# Patient Record
Sex: Male | Born: 1985
Health system: Southern US, Community
[De-identification: ages and names within clinical notes are randomized; demographics above are authoritative.]

## PROBLEM LIST (undated history)

## (undated) ENCOUNTER — Ambulatory Visit: Admission: EM

## (undated) DIAGNOSIS — I10 Essential (primary) hypertension: Secondary | ICD-10-CM

## (undated) DIAGNOSIS — F419 Anxiety disorder, unspecified: Secondary | ICD-10-CM

## (undated) DIAGNOSIS — F32A Depression, unspecified: Secondary | ICD-10-CM

## (undated) DIAGNOSIS — R55 Syncope and collapse: Secondary | ICD-10-CM

## (undated) DIAGNOSIS — F329 Major depressive disorder, single episode, unspecified: Secondary | ICD-10-CM

## (undated) DIAGNOSIS — F1911 Other psychoactive substance abuse, in remission: Secondary | ICD-10-CM

## (undated) HISTORY — DX: Major depressive disorder, single episode, unspecified: F32.9

## (undated) HISTORY — DX: Depression, unspecified: F32.A

## (undated) HISTORY — PX: WISDOM TOOTH EXTRACTION: SHX21

## (undated) HISTORY — DX: Essential (primary) hypertension: I10

## (undated) HISTORY — DX: Anxiety disorder, unspecified: F41.9

---

## 2009-02-17 ENCOUNTER — Emergency Department (HOSPITAL_COMMUNITY): Admission: EM | Admit: 2009-02-17 | Discharge: 2009-02-17 | Payer: Self-pay | Admitting: Emergency Medicine

## 2009-02-18 ENCOUNTER — Ambulatory Visit: Payer: Self-pay | Admitting: Family Medicine

## 2009-09-29 ENCOUNTER — Ambulatory Visit: Payer: Self-pay | Admitting: Family Medicine

## 2009-11-05 ENCOUNTER — Emergency Department (HOSPITAL_COMMUNITY): Admission: EM | Admit: 2009-11-05 | Discharge: 2009-11-05 | Payer: Self-pay | Admitting: Emergency Medicine

## 2009-11-18 ENCOUNTER — Emergency Department (HOSPITAL_COMMUNITY): Admission: EM | Admit: 2009-11-18 | Discharge: 2009-11-18 | Payer: Self-pay | Admitting: Family Medicine

## 2010-01-26 ENCOUNTER — Emergency Department (HOSPITAL_COMMUNITY): Admission: EM | Admit: 2010-01-26 | Discharge: 2010-01-26 | Payer: Self-pay | Admitting: Emergency Medicine

## 2010-05-01 ENCOUNTER — Emergency Department (HOSPITAL_COMMUNITY): Admission: EM | Admit: 2010-05-01 | Discharge: 2010-05-01 | Payer: Self-pay | Admitting: Emergency Medicine

## 2010-05-04 ENCOUNTER — Emergency Department (HOSPITAL_COMMUNITY): Admission: EM | Admit: 2010-05-04 | Discharge: 2010-05-04 | Payer: Self-pay | Admitting: Emergency Medicine

## 2010-05-05 ENCOUNTER — Emergency Department: Payer: Self-pay | Admitting: Emergency Medicine

## 2010-05-07 ENCOUNTER — Emergency Department: Payer: Self-pay | Admitting: Emergency Medicine

## 2010-07-12 ENCOUNTER — Encounter: Payer: Self-pay | Admitting: Emergency Medicine

## 2010-09-01 LAB — POCT I-STAT, CHEM 8
Chloride: 105 mEq/L (ref 96–112)
Creatinine, Ser: 1.1 mg/dL (ref 0.4–1.5)
Glucose, Bld: 97 mg/dL (ref 70–99)
HCT: 51 % (ref 39.0–52.0)
Hemoglobin: 17.3 g/dL — ABNORMAL HIGH (ref 13.0–17.0)
Potassium: 4.1 mEq/L (ref 3.5–5.1)
Sodium: 139 mEq/L (ref 135–145)

## 2010-09-01 LAB — DIFFERENTIAL
Basophils Absolute: 0 10*3/uL (ref 0.0–0.1)
Basophils Relative: 0 % (ref 0–1)
Lymphocytes Relative: 23 % (ref 12–46)
Monocytes Absolute: 0.6 10*3/uL (ref 0.1–1.0)
Monocytes Relative: 6 % (ref 3–12)
Neutro Abs: 6.8 10*3/uL (ref 1.7–7.7)
Neutrophils Relative %: 67 % (ref 43–77)

## 2010-09-01 LAB — CBC
HCT: 46.6 % (ref 39.0–52.0)
Hemoglobin: 16 g/dL (ref 13.0–17.0)
WBC: 10.1 10*3/uL (ref 4.0–10.5)

## 2010-11-10 ENCOUNTER — Inpatient Hospital Stay (INDEPENDENT_AMBULATORY_CARE_PROVIDER_SITE_OTHER)
Admission: RE | Admit: 2010-11-10 | Discharge: 2010-11-10 | Disposition: A | Discharge status: Home / Self Care | Payer: Self-pay | Source: Ambulatory Visit | Attending: Family Medicine | Admitting: Family Medicine

## 2010-11-10 DIAGNOSIS — F410 Panic disorder [episodic paroxysmal anxiety] without agoraphobia: Secondary | ICD-10-CM

## 2011-04-07 ENCOUNTER — Inpatient Hospital Stay (INDEPENDENT_AMBULATORY_CARE_PROVIDER_SITE_OTHER)
Admission: RE | Admit: 2011-04-07 | Discharge: 2011-04-07 | Disposition: A | Discharge status: Home / Self Care | Payer: Self-pay | Source: Ambulatory Visit | Attending: Emergency Medicine | Admitting: Emergency Medicine

## 2011-04-07 DIAGNOSIS — F411 Generalized anxiety disorder: Secondary | ICD-10-CM

## 2011-04-14 ENCOUNTER — Encounter: Payer: Self-pay | Admitting: Medical

## 2011-04-14 ENCOUNTER — Ambulatory Visit (INDEPENDENT_AMBULATORY_CARE_PROVIDER_SITE_OTHER): Payer: Self-pay | Admitting: Medical

## 2011-04-14 VITALS — BP 160/90 | HR 116 | Temp 98.1°F | Resp 20 | Wt 209.0 lb

## 2011-04-14 DIAGNOSIS — F419 Anxiety disorder, unspecified: Secondary | ICD-10-CM | POA: Insufficient documentation

## 2011-04-14 DIAGNOSIS — F411 Generalized anxiety disorder: Secondary | ICD-10-CM

## 2011-04-14 DIAGNOSIS — F32A Depression, unspecified: Secondary | ICD-10-CM | POA: Insufficient documentation

## 2011-04-14 DIAGNOSIS — F329 Major depressive disorder, single episode, unspecified: Secondary | ICD-10-CM

## 2011-04-14 DIAGNOSIS — I10 Essential (primary) hypertension: Secondary | ICD-10-CM | POA: Insufficient documentation

## 2011-04-14 MED ORDER — LISINOPRIL-HYDROCHLOROTHIAZIDE 10-12.5 MG PO TABS: 1.0000 | ORAL_TABLET | Freq: Every day | ORAL | Status: DC

## 2011-04-14 MED ORDER — SERTRALINE HCL 50 MG PO TABS: 50.0000 mg | ORAL_TABLET | Freq: Every day | ORAL | Status: DC

## 2011-04-14 MED ORDER — ALPRAZOLAM 1 MG PO TABS: 1.0000 mg | ORAL_TABLET | Freq: Every evening | ORAL | Status: DC | PRN
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
## 2011-04-14 NOTE — Patient Instructions (Signed)
Anxiety and Panic Attacks Your caregiver has informed you that you are having an anxiety or panic attack. There may be many forms of this. Most of the time these attacks come suddenly and without warning. They come at any time of day, including periods of sleep, and at any time of life. They may be strong and unexplained. Although panic attacks are very scary, they are physically harmless. Sometimes the cause of your anxiety is not known. Anxiety is a protective mechanism of the body in its fight or flight mechanism. Most of these perceived danger situations are actually nonphysical situations (such as anxiety over losing a job). CAUSES  The causes of an anxiety or panic attack are many. Panic attacks may occur in otherwise healthy people given a certain set of circumstances. There may be a genetic cause for panic attacks. Some medications may also have anxiety as a side effect. SYMPTOMS  Some of the most common feelings are:  Intense terror.   Dizziness, feeling faint.   Hot and cold flashes.   Fear of going crazy.   Feelings that nothing is real.   Sweating.   Shaking.   Chest pain or a fast heartbeat (palpitations).   Smothering, choking sensations.   Feelings of impending doom and that death is near.   Tingling of extremities, this may be from over-breathing.   Altered reality (derealization).   Being detached from yourself (depersonalization).  Several symptoms can be present to make up anxiety or panic attacks. DIAGNOSIS  The evaluation by your caregiver will depend on the type of symptoms you are experiencing. The diagnosis of anxiety or panic attack is made when no physical illness can be determined to be a cause of the symptoms. TREATMENT  Treatment to prevent anxiety and panic attacks may include:  Avoidance of circumstances that cause anxiety.   Reassurance and relaxation.   Regular exercise.   Relaxation therapies, such as yoga.   Psychotherapy with a  psychiatrist or therapist.   Avoidance of caffeine, alcohol and illegal drugs.   Prescribed medication.  SEEK IMMEDIATE MEDICAL CARE IF:   You experience panic attack symptoms that are different than your usual symptoms.   You have any worsening or concerning symptoms.  Document Released: 06/07/2005 Document Revised: 02/17/2011 Document Reviewed: 10/09/2009 ExitCare Patient Information 2012 [employer].Depression, Adolescent and Adult Depression is a true and treatable medical condition. In general there are two kinds of depression:  Depression we all experience in some form. For example depression from the death of a loved one, financial distress or natural disasters will trigger or increase depression.   Clinical depression, on the other hand, appears without an apparent cause or reason. This depression is a disease. Depression may be caused by chemical imbalance in the body and brain or may come as a response to a physical illness. Alcohol and other drugs can cause depression.  DIAGNOSIS  The diagnosis of depression is usually based upon symptoms and medical history. TREATMENT  Treatments for depression fall into three categories. These are:  Drug therapy. There are many medicines that treat depression. Responses may vary and sometimes trial and error is necessary to determine the best medicines and dosage for a particular patient.   Psychotherapy, also called talking treatments, helps people resolve their problems by looking at them from a different point of view and by giving people insight into their own personal makeup. Traditional psychotherapy looks at a childhood source of a problem. Other psychotherapy will look at current conflicts and   move toward solving those. If the cause of depression is drug use, counseling is available to help abstain. In time the depression will usually improve. If there were underlying causes for the chemical use, they can be addressed.   ECT  (electroconvulsive therapy) or shock treatment is not as commonly used today. It is a very effective treatment for severe suicidal depression. During ECT electrical impulses are applied to the head. These impulses cause a generalized seizure. It can be effective but causes a loss of memory for recent events. Sometimes this loss of memory may include the last several months.  Treat all depression or suicide threats as serious. Obtain professional help. Do not wait to see if serious depression will get better over time without help. Seek help for yourself or those around you. In the [place] the number to the National Suicide Help Lines With 24 Hour Help Are: 1-800-SUICIDE 1-800-784-2433 Document Released: 06/04/2000 Document Revised: 02/17/2011 Document Reviewed: 01/24/2008 ExitCare Patient Information 2012 [employer]. 

## 2011-05-27 ENCOUNTER — Encounter (HOSPITAL_COMMUNITY): Payer: Self-pay | Admitting: *Deleted

## 2011-05-27 ENCOUNTER — Emergency Department (HOSPITAL_COMMUNITY)
Admission: EM | Admit: 2011-05-27 | Discharge: 2011-05-28 | Disposition: A | Discharge status: Rehabilitation Facility | Payer: Self-pay | Attending: Emergency Medicine | Admitting: Emergency Medicine

## 2011-05-27 DIAGNOSIS — F411 Generalized anxiety disorder: Secondary | ICD-10-CM | POA: Insufficient documentation

## 2011-05-27 DIAGNOSIS — F1920 Other psychoactive substance dependence, uncomplicated: Secondary | ICD-10-CM

## 2011-05-27 DIAGNOSIS — F3289 Other specified depressive episodes: Secondary | ICD-10-CM | POA: Insufficient documentation

## 2011-05-27 DIAGNOSIS — I10 Essential (primary) hypertension: Secondary | ICD-10-CM | POA: Insufficient documentation

## 2011-05-27 DIAGNOSIS — F329 Major depressive disorder, single episode, unspecified: Secondary | ICD-10-CM | POA: Insufficient documentation

## 2011-05-27 LAB — COMPREHENSIVE METABOLIC PANEL
ALT: 32 U/L (ref 0–53)
Alkaline Phosphatase: 62 U/L (ref 39–117)
BUN: 10 mg/dL (ref 6–23)
CO2: 33 mEq/L — ABNORMAL HIGH (ref 19–32)
Chloride: 99 mEq/L (ref 96–112)
GFR calc Af Amer: >90 mL/min (ref >90)
GFR calc non Af Amer: >90 mL/min (ref >90)
Glucose, Bld: 100 mg/dL — ABNORMAL HIGH (ref 70–99)
Potassium: 4.3 mEq/L (ref 3.5–5.1)
Sodium: 138 mEq/L (ref 135–145)
Total Bilirubin: 0.3 mg/dL (ref 0.3–1.2)

## 2011-05-27 LAB — ACETAMINOPHEN LEVEL: Acetaminophen (Tylenol), Serum: <15 ug/mL (ref 10–30)

## 2011-05-27 LAB — ETHANOL: Alcohol, Ethyl (B): <11 mg/dL (ref 0–11)

## 2011-05-27 LAB — COMPREHENSIVE METABOLIC PANEL WITH GFR
AST: 21 U/L (ref 0–37)
Albumin: 4.2 g/dL (ref 3.5–5.2)
Calcium: 9.8 mg/dL (ref 8.4–10.5)
Creatinine, Ser: 0.93 mg/dL (ref 0.50–1.35)
Total Protein: 8 g/dL (ref 6.0–8.3)

## 2011-05-27 LAB — CBC
HCT: 46.1 % (ref 39.0–52.0)
Hemoglobin: 15.8 g/dL (ref 13.0–17.0)
MCH: 30.8 pg (ref 26.0–34.0)
MCHC: 34.3 g/dL (ref 30.0–36.0)
MCV: 89.9 fL (ref 78.0–100.0)
Platelets: 237 K/uL (ref 150–400)
RBC: 5.13 MIL/uL (ref 4.22–5.81)
RDW: 12.8 % (ref 11.5–15.5)
WBC: 10.1 K/uL (ref 4.0–10.5)

## 2011-05-27 MED ORDER — CLONIDINE HCL 0.1 MG PO TABS
0.1000 mg | ORAL_TABLET | Freq: Two times a day (BID) | ORAL | Status: DC
  Administered 2011-05-27 – 2011-05-28 (×2): 0.1 mg via ORAL
  Filled 2011-05-27 (×2): qty 1

## 2011-05-27 MED ORDER — IBUPROFEN 200 MG PO TABS: 600.0000 mg | ORAL_TABLET | Freq: Three times a day (TID) | ORAL | Status: DC | PRN

## 2011-05-27 MED ORDER — ACETAMINOPHEN 325 MG PO TABS: 650.0000 mg | ORAL_TABLET | ORAL | Status: DC | PRN

## 2011-05-27 MED ORDER — ALUM & MAG HYDROXIDE-SIMETH 200-200-20 MG/5ML PO SUSP: 30.0000 mL | ORAL | Status: DC | PRN

## 2011-05-27 MED ORDER — LORAZEPAM 1 MG PO TABS
ORAL_TABLET | ORAL | Status: AC
  Filled 2011-05-27: qty 1

## 2011-05-27 MED ORDER — ZOLPIDEM TARTRATE 5 MG PO TABS
10.0000 mg | ORAL_TABLET | Freq: Once | ORAL | Status: AC
  Administered 2011-05-27: 10 mg via ORAL
  Filled 2011-05-27: qty 2

## 2011-05-27 MED ORDER — LORAZEPAM 1 MG PO TABS
1.0000 mg | ORAL_TABLET | Freq: Three times a day (TID) | ORAL | Status: DC | PRN
  Administered 2011-05-28: 1 mg via ORAL
  Filled 2011-05-27: qty 1

## 2011-05-27 MED ORDER — LORAZEPAM 1 MG PO TABS
1.0000 mg | ORAL_TABLET | Freq: Once | ORAL | Status: AC
  Administered 2011-05-27: 1 mg via ORAL

## 2011-05-27 NOTE — ED Provider Notes (Signed)
  Physical Exam  BP 148/100  Pulse 66  Temp(Src) 98.7 F (37.1 C) (Oral)  Resp 16  SpO2 99%  Physical Exam  ED Course  Procedures  MDM Sign out from [name].  Depressed but not suicidal.  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     Glynn Octave, MD 05/27/11 [phone]

## 2011-05-27 NOTE — BH Assessment (Signed)
Assessment Note   Eric Keller is an 25 y.o. male.  Eric Keller was accepted to [facility] by Eric Keller there.  This clinician got authorization from [facility] with [facility].  Authorization is for 3 days, [facility]  #[id number].  Coleman said that his sister would be able to take him.  He will call her and she should be picking him up between 0700-0800.  Nurse will need to call [facility] at [phone] when patient leaves.   Axis I: Substance Abuse Axis II: Deferred Axis III:  Past Medical History  Diagnosis Date  . Anxiety   . Depression   . Hypertension    Axis IV: other psychosocial or environmental problems and problems related to legal system/crime Axis V: 31-40 impairment in reality testing  Past Medical History:  Past Medical History  Diagnosis Date  . Anxiety   . Depression   . Hypertension     History reviewed. No pertinent past surgical history.  Family History: History reviewed. No pertinent family history.  Social History:  reports that he has never smoked. He does not have any smokeless tobacco history on file. He reports that he drinks alcohol. He reports that he uses illicit drugs.  Additional Social History:  Alcohol / Drug Use Pain Medications: Methadone Prescriptions: Xanax Over the Counter: N/A History of alcohol / drug use?: Yes Substance #1 Name of Substance 1: Methadone 1 - Age of First Use: 25 years old 1 - Amount (size/oz): 120 mg per day  1 - Frequency: qam usually at 0600 1 - Duration: That amount since September '12 1 - Last Use / Amount: 12/06 at 0600 Substance #2 Name of Substance 2: xanax 2 - Age of First Use: 25 years old 2 - Amount (size/oz): 8mg  per day  2 - Frequency: Daily use usually twice daily to total 8 mg 2 - Duration: Since May of '12 2 - Last Use / Amount: 12/05 Substance #3 Name of Substance 3: Marijuana 3 - Age of First Use: 25 years old 3 - Amount (size/oz): About a bowl 3 - Frequency: Usually twice weekly 3 - Duration: Years 3 - Last Use / Amount:  12/05 Substance #4 Name of Substance 4: ETOH 4 - Age of First Use: 25 years old 4 - Amount (size/oz): Amount varies, maybe about 3-4 drinks 4 - Frequency: Usually twice weekly 4 - Duration: Years 4 - Last Use / Amount: About a week ago Allergies: No Known Allergies  Home Medications:  Medications Prior to Admission  Medication Dose Route Frequency Provider Last Rate Last Dose  . acetaminophen (TYLENOL) tablet 650 mg  650 mg Oral Q4H PRN [name]. [name], MD      . alum & mag hydroxide-simeth (MAALOX/MYLANTA) 200-200-20 MG/5ML suspension 30 mL  30 mL Oral PRN [name]. [name], MD      . cloNIDine (CATAPRES) tablet 0.1 mg  0.1 mg Oral BID [name]. [name], MD      . ibuprofen (ADVIL,MOTRIN) tablet 600 mg  600 mg Oral Q8H PRN [name]. [name], MD      . LORazepam (ATIVAN) tablet 1 mg  1 mg Oral Once [name]. [name], MD   1 mg at 05/27/11 1958  . LORazepam (ATIVAN) tablet 1 mg  1 mg Oral Q8H PRN [name]. [name], MD       Medications Prior to Admission  Medication Sig Dispense Refill  . ALPRAZolam (XANAX) 1 MG tablet Take 1 mg by mouth every 3 (three) hours as needed. For anxiety       .  lisinopril-hydrochlorothiazide (PRINZIDE,ZESTORETIC) 10-12.5 MG per tablet Take 1 tablet by mouth daily.  30 tablet  3    OB/GYN Status:  No LMP for male patient.  General Assessment Data Assessment Number: 1  Living Arrangements: Parent Can pt return to current living arrangement?: Yes Admission Status: Voluntary Is patient capable of signing voluntary admission?: Yes Transfer from: Acute Hospital Referral Source: Self/Family/Friend     Risk to self Suicidal Ideation: No Suicidal Intent: No Is patient at risk for suicide?: No Suicidal Plan?: No Access to Means: No What has been your use of drugs/alcohol within the last 12 months?:  (Using xanax and methadone) Previous Attempts/Gestures: No How many times?:  (N/A) Other Self Harm Risks: None Triggers for Past Attempts: None known Intentional  Self Injurious Behavior: None Family Suicide History: No Recent stressful life event(s): Legal Issues;Turmoil (Comment) (Family problems & girlfriend problems) Persecutory voices/beliefs?: No Depression: Yes Depression Symptoms: Despondent;Insomnia;Guilt Substance abuse history and/or treatment for substance abuse?: Yes Suicide prevention information given to non-admitted patients: Not applicable  Risk to Others Homicidal Ideation: No Thoughts of Harm to Others: No Current Homicidal Intent: No Current Homicidal Plan: No Access to Homicidal Means: No Identified Victim:  (No one) History of harm to others?: No Assessment of Violence: None Noted Violent Behavior Description:  (Pt calm and cooperative) Does patient have access to weapons?: No Criminal Charges Pending?: Yes Describe Pending Criminal Charges: Larceny Does patient have a court date: Yes Court Date:  (Patient unsure)  Psychosis Hallucinations: None noted Delusions: None noted  Mental Status Report Appear/Hygiene:  (Casual) Eye Contact: Good Motor Activity: Unremarkable Speech: Logical/coherent Level of Consciousness: Alert Mood: Anxious Affect: Depressed;Anxious Anxiety Level: Panic Attacks Panic attack frequency:  (About 1x/W) Most recent panic attack: 1 week ago Thought Processes: Coherent;Relevant Judgement: Impaired Orientation: Person;Place;Time;Situation Obsessive Compulsive Thoughts/Behaviors: None  Cognitive Functioning Concentration: Normal Memory: Remote Intact;Recent Impaired IQ: Average Insight: Good Impulse Control: Fair Appetite: Good Weight Loss:  (N/A) Weight Gain:  (N/A) Sleep: Decreased Total Hours of Sleep:  (<4H/D) Vegetative Symptoms: None  Prior Inpatient Therapy Prior Inpatient Therapy: Yes Prior Therapy Dates:  (06-16-11 for detox) Prior Therapy Facilty/Provider(s):  ([facility]) Reason for Treatment:  (Detox)  Prior Outpatient Therapy Prior Outpatient Therapy: Yes Prior  Therapy Dates:  (February 2012 to present) Prior Therapy Facilty/Provider(s):  [facility]) Reason for Treatment:  (methadone tx and counseling)  ADL Screening (condition at time of admission) Patient's cognitive ability adequate to safely complete daily activities?: Yes Patient able to express need for assistance with ADLs?: Yes Independently performs ADLs?: Yes Dressing (OT): Independent Weakness of Legs: None Weakness of Arms/Hands: None  Home Assistive Devices/Equipment Home Assistive Devices/Equipment: None    Abuse/Neglect Assessment (Assessment to be complete while patient is alone) Physical Abuse: Denies Verbal Abuse: Denies Sexual Abuse: Denies Exploitation of patient/patient's resources: Denies Self-Neglect: Denies Values / Beliefs Cultural Requests During Hospitalization: None Spiritual Requests During Hospitalization: None        Additional Information 1:1 In Past 12 Months?: No CIRT Risk: No Elopement Risk: No Does patient have medical clearance?: Yes     Disposition:  Disposition Disposition of Patient: Inpatient treatment program Type of inpatient treatment program: Adult (Needs inpatient detox.  Please run)  On Site Evaluation by:   Reviewed with Physician:     Beatriz Stallion Ray 05/27/2011 10:09 PM

## 2011-05-27 NOTE — ED Provider Notes (Signed)
History     CSN: [id number] Arrival date & time: 05/27/2011  4:36 PM   First MD Initiated Contact with Patient 05/27/11 1736      Chief Complaint  Patient presents with  . Addiction Problem    (Consider location/radiation/quality/duration/timing/severity/associated sxs/prior treatment) HPI Comments: Patient presents to ED requesting inpatient detox from methadone and Xanax he takes 120 mg of methadone daily as well as 8 mg total of Xanax spread out twice a day. This began after he had a back injury requiring narcotic use. The methadone was used to replace the opiates. He reports a mild depression secondary to his dependence. He denies any suicidal ideation or plans. He reports mild anxiety currently. He denies any recent fevers, cold symptoms, chest pain, shortness of breath, nausea or vomiting.  The history is provided by the patient.    Past Medical History  Diagnosis Date  . Anxiety   . Depression   . Hypertension     History reviewed. No pertinent past surgical history.  History reviewed. No pertinent family history.  History  Substance Use Topics  . Smoking status: Never Smoker   . Smokeless tobacco: Not on file  . Alcohol Use: Yes      Review of Systems  Constitutional: Positive for appetite change.  HENT: Negative for congestion, sore throat and rhinorrhea.   Respiratory: Negative for shortness of breath.   Cardiovascular: Negative for chest pain.  Gastrointestinal: Negative for nausea, vomiting and abdominal pain.  Psychiatric/Behavioral: Negative for suicidal ideas. The patient is nervous/anxious.   All other systems reviewed and are negative.    Allergies  Review of patient's allergies indicates no known allergies.  Home Medications   Current Outpatient Rx  Name Route Sig Dispense Refill  . ALPRAZOLAM 1 MG PO TABS Oral Take 1 mg by mouth every 3 (three) hours as needed. For anxiety     . LISINOPRIL-HYDROCHLOROTHIAZIDE 10-12.5 MG PO TABS Oral Take 1  tablet by mouth daily. 30 tablet 3  . METHADONE HCL 10 MG/ML PO CONC Oral Take 120 mg by mouth daily.      Carma Leaven M PLUS PO TABS Oral Take 1 tablet by mouth daily.      Marland Kitchen RANITIDINE HCL 75 MG PO TABS Oral Take 75 mg by mouth daily.        BP 148/100  Pulse 66  Temp(Src) 98.7 F (37.1 C) (Oral)  Resp 16  SpO2 99%  Physical Exam  Nursing note and vitals reviewed. Constitutional: He is oriented to person, place, and time. He appears well-developed and well-nourished.  HENT:  Head: Normocephalic and atraumatic.  Eyes: EOM are normal. Pupils are equal, round, and reactive to light.  Cardiovascular: Normal rate.   Pulmonary/Chest: Effort normal and breath sounds normal.  Abdominal: Soft. Bowel sounds are normal.  Musculoskeletal: Normal range of motion.  Neurological: He is alert and oriented to person, place, and time.  Skin: Skin is warm and dry.  Psychiatric: His affect is not labile and not inappropriate. His speech is not rapid and/or pressured, not delayed and not tangential. He does not express impulsivity or inappropriate judgment.    ED Course  Procedures (including critical care time)  Labs Reviewed  COMPREHENSIVE METABOLIC PANEL - Abnormal; Notable for the following:    CO2 33 (*)    Glucose, Bld 100 (*)    All other components within normal limits  CBC  ETHANOL  ACETAMINOPHEN LEVEL  URINE RAPID DRUG SCREEN (HOSP PERFORMED)   No results  found.   No diagnosis found.    MDM  Patient is medically cleared. Although he wants detox from benzodiazepines. I do not feel that taking completely off of the benzodiazepines all at once is appropriate. Will give him 1 mg of Ativan every 8 hours as needed. Patient does request some sleep aid tonight so we will give him Ambien once this evening. Behavioral health consult and has seen the patient and is actively working on [facility] for him for a detox program. Clonidine is also ordered to be given twice a day to assist with opiate  withdrawal symptoms. His lab tests are otherwise within normal limits. There is no suicidal or homicidal ideation. No delusions or hallucinations.       [name]. Cherity Blickenstaff, MD 05/27/11 2340

## 2011-05-27 NOTE — ED Notes (Signed)
Pt reports he would like detox from methadone and xanax. Last use of methadone was 6am and xanax 2am. No alcohol use today but does drink frequently.

## 2011-05-27 NOTE — BH Assessment (Signed)
Assessment Note   Eric Keller is an 25 y.o. male.  Eric Keller came to [facility] to seek assistance with detoxing from methadone and xanax.  Eric Keller denies any SI, HI or A/V hallucinations.  Eric Keller is currently getting 120mg  of methadone from [facility].  He has been using at this rate since September.  He takes it at 0600 and last dose was on 12/06.  Uses xanax daily with usage being 8mg  usually 4mg  twice daily.  Last use of xanax was on 12/05.  Xanax use has been since May of this year.  Eric Keller wants to get off the xanax and the methadone.  Eric Keller has been depressed about his drug use.  He has been depressed and is anxious about this detox.  Eric Keller went to detox on 06-15-10 at [facility] and got cleared from pain killers at that time.  He started methadone in late February because he was starting to go back to opiates.  He wants to get clear of drugs at this time. Axis I: Substance Abuse Axis II: Deferred Axis III:  Past Medical History  Diagnosis Date  . Anxiety   . Depression   . Hypertension    Axis IV: other psychosocial or environmental problems and problems related to legal system/crime Axis V: 31-40 impairment in reality testing  Past Medical History:  Past Medical History  Diagnosis Date  . Anxiety   . Depression   . Hypertension     History reviewed. No pertinent past surgical history.  Family History: History reviewed. No pertinent family history.  Social History:  reports that he has never smoked. He does not have any smokeless tobacco history on file. He reports that he drinks alcohol. He reports that he uses illicit drugs.  Additional Social History:  Alcohol / Drug Use Pain Medications: Methadone Prescriptions: Xanax Over the Counter: N/A History of alcohol / drug use?: Yes Substance #1 Name of Substance 1: Methadone 1 - Age of First Use: 25 years old 1 - Amount (size/oz): 120 mg per day  1 - Frequency: qam usually at 0600 1 - Duration: That amount since September '12 1 -  Last Use / Amount: 12/06 at 0600 Substance #2 Name of Substance 2: xanax 2 - Age of First Use: 25 years old 2 - Amount (size/oz): 8mg  per day  2 - Frequency: Daily use usually twice daily to total 8 mg 2 - Duration: Since May of '12 2 - Last Use / Amount: 12/05 Substance #3 Name of Substance 3: Marijuana 3 - Age of First Use: 25 years old 3 - Amount (size/oz): About a bowl 3 - Frequency: Usually twice weekly 3 - Duration: Years 3 - Last Use / Amount: 12/05 Substance #4 Name of Substance 4: ETOH 4 - Age of First Use: 25 years old 4 - Amount (size/oz): Amount varies, maybe about 3-4 drinks 4 - Frequency: Usually twice weekly 4 - Duration: Years 4 - Last Use / Amount: About a week ago Allergies: No Known Allergies  Home Medications:  Medications Prior to Admission  Medication Dose Route Frequency Provider Last Rate Last Dose  . acetaminophen (TYLENOL) tablet 650 mg  650 mg Oral Q4H PRN [name]. [name], MD      . alum & mag hydroxide-simeth (MAALOX/MYLANTA) 200-200-20 MG/5ML suspension 30 mL  30 mL Oral PRN [name]. [name], MD      . cloNIDine (CATAPRES) tablet 0.1 mg  0.1 mg Oral BID [name]. [name], MD      .  ibuprofen (ADVIL,MOTRIN) tablet 600 mg  600 mg Oral Q8H PRN [name]. [name], MD      . LORazepam (ATIVAN) tablet 1 mg  1 mg Oral Once [name]. [name], MD   1 mg at 05/27/11 1958  . LORazepam (ATIVAN) tablet 1 mg  1 mg Oral Q8H PRN [name]. [name], MD       Medications Prior to Admission  Medication Sig Dispense Refill  . ALPRAZolam (XANAX) 1 MG tablet Take 1 mg by mouth every 3 (three) hours as needed. For anxiety       . lisinopril-hydrochlorothiazide (PRINZIDE,ZESTORETIC) 10-12.5 MG per tablet Take 1 tablet by mouth daily.  30 tablet  3    OB/GYN Status:  No LMP for male patient.  General Assessment Data Assessment Number: 1  Living Arrangements: Parent Can pt return to current living arrangement?: Yes Admission Status: Voluntary Is patient capable of signing  voluntary admission?: Yes Transfer from: Acute Hospital Referral Source: Self/Family/Friend     Risk to self Suicidal Ideation: No Suicidal Intent: No Is patient at risk for suicide?: No Suicidal Plan?: No Access to Means: No What has been your use of drugs/alcohol within the last 12 months?:  (Using xanax and methadone) Previous Attempts/Gestures: No How many times?:  (N/A) Other Self Harm Risks: None Triggers for Past Attempts: None known Intentional Self Injurious Behavior: None Family Suicide History: No Recent stressful life event(s): Legal Issues;Turmoil (Comment) (Family problems & girlfriend problems) Persecutory voices/beliefs?: No Depression: Yes Depression Symptoms: Despondent;Insomnia;Guilt Substance abuse history and/or treatment for substance abuse?: Yes Suicide prevention information given to non-admitted patients: Not applicable  Risk to Others Homicidal Ideation: No Thoughts of Harm to Others: No Current Homicidal Intent: No Current Homicidal Plan: No Access to Homicidal Means: No Identified Victim:  (No one) History of harm to others?: No Assessment of Violence: None Noted Violent Behavior Description:  (Pt calm and cooperative) Does patient have access to weapons?: No Criminal Charges Pending?: Yes Describe Pending Criminal Charges: Larceny Does patient have a court date: Yes Court Date:  (Patient unsure)  Psychosis Hallucinations: None noted Delusions: None noted  Mental Status Report Appear/Hygiene:  (Casual) Eye Contact: Good Motor Activity: Unremarkable Speech: Logical/coherent Level of Consciousness: Alert Mood: Anxious Affect: Depressed;Anxious Anxiety Level: Panic Attacks Panic attack frequency:  (About 1x/W) Most recent panic attack: 1 week ago Thought Processes: Coherent;Relevant Judgement: Impaired Orientation: Person;Place;Time;Situation Obsessive Compulsive Thoughts/Behaviors: None  Cognitive Functioning Concentration:  Normal Memory: Remote Intact;Recent Impaired IQ: Average Insight: Good Impulse Control: Fair Appetite: Good Weight Loss:  (N/A) Weight Gain:  (N/A) Sleep: Decreased Total Hours of Sleep:  (<4H/D) Vegetative Symptoms: None  Prior Inpatient Therapy Prior Inpatient Therapy: Yes Prior Therapy Dates:  (06-16-11 for detox) Prior Therapy Facilty/Provider(s):  ([facility]) Reason for Treatment:  (Detox)  Prior Outpatient Therapy Prior Outpatient Therapy: Yes Prior Therapy Dates:  (February 2012 to present) Prior Therapy Facilty/Provider(s):  [facility]) Reason for Treatment:  (methadone tx and counseling)  ADL Screening (condition at time of admission) Patient's cognitive ability adequate to safely complete daily activities?: Yes Patient able to express need for assistance with ADLs?: Yes Independently performs ADLs?: Yes Dressing (OT): Independent Weakness of Legs: None Weakness of Arms/Hands: None  Home Assistive Devices/Equipment Home Assistive Devices/Equipment: None    Abuse/Neglect Assessment (Assessment to be complete while patient is alone) Physical Abuse: Denies Verbal Abuse: Denies Sexual Abuse: Denies Exploitation of patient/patient's resources: Denies Self-Neglect: Denies Values / Beliefs Cultural Requests During Hospitalization: None Spiritual Requests During  Hospitalization: None        Additional Information 1:1 In Past 12 Months?: No CIRT Risk: No Elopement Risk: No Does patient have medical clearance?: Yes     Disposition:  Disposition Disposition of Patient: Inpatient treatment program Type of inpatient treatment program: Adult (Needs inpatient detox.  Please run)  On Site Evaluation by:   Reviewed with Physician:  Dr. Quita Skye at [phone].   Beatriz Stallion Ray 05/27/2011 9:51 PM

## 2011-05-28 LAB — RAPID URINE DRUG SCREEN, HOSP PERFORMED
Benzodiazepines: POSITIVE — AB
Opiates: NOT DETECTED

## 2011-05-28 NOTE — ED Notes (Signed)
Patient transported to CT 

## 2011-05-28 NOTE — ED Notes (Signed)
Patient has been accepted at [facility].  Patient's sister will transport him there between 7-8.  Glynn Octave, MD 05/28/11 [phone]

## 2011-09-28 ENCOUNTER — Emergency Department: Payer: Self-pay | Admitting: Emergency Medicine

## 2011-09-28 LAB — CBC
MCH: 30.8 pg (ref 26.0–34.0)
MCHC: 33.3 g/dL (ref 32.0–36.0)
MCV: 92 fL (ref 80–100)
Platelet: 299 10*3/uL (ref 150–440)
RBC: 5.19 10*6/uL (ref 4.40–5.90)

## 2011-09-28 LAB — COMPREHENSIVE METABOLIC PANEL
Albumin: 4.3 g/dL (ref 3.4–5.0)
Alkaline Phosphatase: 73 U/L (ref 50–136)
Anion Gap: 7 (ref 7–16)
Bilirubin,Total: 0.3 mg/dL (ref 0.2–1.0)
EGFR (African American): >60
EGFR (Non-African Amer.): >60
Osmolality: 281 (ref 275–301)
Potassium: 4.4 mmol/L (ref 3.5–5.1)
SGOT(AST): 42 U/L — ABNORMAL HIGH (ref 15–37)
SGPT (ALT): 54 U/L
Sodium: 140 mmol/L (ref 136–145)
Total Protein: 8.3 g/dL — ABNORMAL HIGH (ref 6.4–8.2)

## 2011-09-28 LAB — URINALYSIS, COMPLETE
Blood: NEGATIVE
Glucose,UR: NEGATIVE mg/dL (ref 0–75)
Ketone: NEGATIVE
Leukocyte Esterase: NEGATIVE
Nitrite: NEGATIVE
Ph: 8 (ref 4.5–8.0)
RBC,UR: NONE SEEN /HPF (ref 0–5)
Specific Gravity: 1.002 (ref 1.003–1.030)
Squamous Epithelial: NONE SEEN

## 2011-09-28 LAB — DRUG SCREEN, URINE
Barbiturates, Ur Screen: NEGATIVE (ref ?–200)
Benzodiazepine, Ur Scrn: NEGATIVE (ref ?–200)
Cannabinoid 50 Ng, Ur ~~LOC~~: POSITIVE (ref ?–50)
Opiate, Ur Screen: NEGATIVE (ref ?–300)
Phencyclidine (PCP) Ur S: NEGATIVE (ref ?–25)
Tricyclic, Ur Screen: NEGATIVE (ref ?–1000)

## 2011-09-28 LAB — TSH: Thyroid Stimulating Horm: 1.45 u[IU]/mL

## 2011-09-28 LAB — ETHANOL
Ethanol %: <0.003 % (ref 0.000–0.080)
Ethanol: <3 mg/dL

## 2011-12-10 ENCOUNTER — Emergency Department: Payer: Self-pay | Admitting: Emergency Medicine

## 2011-12-10 LAB — URINALYSIS, COMPLETE
Bacteria: NONE SEEN
Blood: NEGATIVE
Glucose,UR: NEGATIVE mg/dL (ref 0–75)
Nitrite: NEGATIVE
Specific Gravity: 1.004 (ref 1.003–1.030)

## 2011-12-10 LAB — DRUG SCREEN, URINE
Amphetamines, Ur Screen: NEGATIVE (ref ?–1000)
Barbiturates, Ur Screen: NEGATIVE (ref ?–200)
Benzodiazepine, Ur Scrn: POSITIVE (ref ?–200)
Cannabinoid 50 Ng, Ur ~~LOC~~: POSITIVE (ref ?–50)
Methadone, Ur Screen: POSITIVE (ref ?–300)
Phencyclidine (PCP) Ur S: NEGATIVE (ref ?–25)
Tricyclic, Ur Screen: NEGATIVE (ref ?–1000)

## 2011-12-10 LAB — COMPREHENSIVE METABOLIC PANEL
Anion Gap: 5 — ABNORMAL LOW (ref 7–16)
Bilirubin,Total: 0.4 mg/dL (ref 0.2–1.0)
Chloride: 103 mmol/L (ref 98–107)
Co2: 31 mmol/L (ref 21–32)
Osmolality: 276 (ref 275–301)
SGPT (ALT): 37 U/L
Sodium: 139 mmol/L (ref 136–145)

## 2011-12-10 LAB — ETHANOL: Ethanol: <3 mg/dL

## 2011-12-10 LAB — CBC: RBC: 5.09 10*6/uL (ref 4.40–5.90)

## 2012-02-09 ENCOUNTER — Emergency Department (HOSPITAL_COMMUNITY)
Admission: EM | Admit: 2012-02-09 | Discharge: 2012-02-09 | Disposition: A | Discharge status: Home / Self Care | Payer: Self-pay | Attending: Emergency Medicine | Admitting: Emergency Medicine

## 2012-02-09 ENCOUNTER — Encounter (HOSPITAL_COMMUNITY): Payer: Self-pay | Admitting: Emergency Medicine

## 2012-02-09 ENCOUNTER — Emergency Department (HOSPITAL_COMMUNITY): Payer: Self-pay

## 2012-02-09 DIAGNOSIS — R0789 Other chest pain: Secondary | ICD-10-CM | POA: Insufficient documentation

## 2012-02-09 DIAGNOSIS — F419 Anxiety disorder, unspecified: Secondary | ICD-10-CM

## 2012-02-09 DIAGNOSIS — R55 Syncope and collapse: Secondary | ICD-10-CM | POA: Insufficient documentation

## 2012-02-09 DIAGNOSIS — Z79899 Other long term (current) drug therapy: Secondary | ICD-10-CM | POA: Insufficient documentation

## 2012-02-09 DIAGNOSIS — F411 Generalized anxiety disorder: Secondary | ICD-10-CM | POA: Insufficient documentation

## 2012-02-09 DIAGNOSIS — R0602 Shortness of breath: Secondary | ICD-10-CM | POA: Insufficient documentation

## 2012-02-09 DIAGNOSIS — I10 Essential (primary) hypertension: Secondary | ICD-10-CM | POA: Insufficient documentation

## 2012-02-09 HISTORY — DX: Other psychoactive substance abuse, in remission: F19.11

## 2012-02-09 LAB — CBC WITH DIFFERENTIAL/PLATELET
Basophils Absolute: 0 10*3/uL (ref 0.0–0.1)
Basophils Relative: 0 % (ref 0–1)
Eosinophils Relative: 3 % (ref 0–5)
HCT: 44.4 % (ref 39.0–52.0)
MCHC: 35.1 g/dL (ref 30.0–36.0)
MCV: 88.4 fL (ref 78.0–100.0)
Monocytes Absolute: 0.4 10*3/uL (ref 0.1–1.0)
Platelets: 221 10*3/uL (ref 150–400)
RDW: 12.7 % (ref 11.5–15.5)

## 2012-02-09 LAB — COMPREHENSIVE METABOLIC PANEL
AST: 21 U/L (ref 0–37)
Albumin: 4.3 g/dL (ref 3.5–5.2)
Calcium: 9.7 mg/dL (ref 8.4–10.5)
Creatinine, Ser: 1.06 mg/dL (ref 0.50–1.35)
Total Protein: 7.1 g/dL (ref 6.0–8.3)

## 2012-02-09 LAB — GLUCOSE, CAPILLARY

## 2012-02-09 LAB — RAPID URINE DRUG SCREEN, HOSP PERFORMED
Amphetamines: NOT DETECTED
Barbiturates: NOT DETECTED
Cocaine: NOT DETECTED
Opiates: NOT DETECTED
Tetrahydrocannabinol: POSITIVE — AB

## 2012-02-09 MED ORDER — ONDANSETRON HCL 4 MG/2ML IJ SOLN
4.0000 mg | Freq: Once | INTRAMUSCULAR | Status: AC
  Administered 2012-02-09: 4 mg via INTRAVENOUS
  Filled 2012-02-09: qty 2

## 2012-02-09 MED ORDER — LORAZEPAM 1 MG PO TABS: 1.0000 mg | ORAL_TABLET | Freq: Three times a day (TID) | ORAL | Status: DC | PRN

## 2012-02-09 MED ORDER — ALPRAZOLAM 0.5 MG PO TABS
1.0000 mg | ORAL_TABLET | Freq: Once | ORAL | Status: AC
  Administered 2012-02-09: 1 mg via ORAL
  Filled 2012-02-09: qty 2

## 2012-02-09 NOTE — ED Notes (Signed)
Patient transported to X-ray 

## 2012-02-09 NOTE — ED Notes (Signed)
Pt states he ran out of his clonazepam yesterday, states he doesn't know if he passed out or not, pt reports he was breathing fast and felt like he couldn't get his breath. EMS reported pt VS stable and sats good during assess.

## 2012-02-09 NOTE — ED Notes (Signed)
[id number]<AV> Expected date:02/09/12<BR> Expected time:11:47 AM<BR> Means of arrival:Ambulance<BR> Comments:<BR> Anxiety/SOB/chest pain

## 2012-02-09 NOTE — ED Notes (Signed)
Attempted to help pt provide urine sample. Patient asked if he could sit on side of bed. Helped pt to sitting position and then pt states "I just don't have to pee. I don't have any urine in me". Told pt to ring call bell for help when he could provide sample.

## 2012-02-09 NOTE — ED Provider Notes (Signed)
History     CSN: [id number]  Arrival date & time 02/09/12  1219   First MD Initiated Contact with Patient 02/09/12 1237      Chief Complaint  Patient presents with  . Panic Attack    (Consider location/radiation/quality/duration/timing/severity/associated sxs/prior treatment) HPI Patient is a 26 yo male with history of anxiety who presented today by EMS for evaluation of chest pain and an episode of syncope that began following severe anxiety this AM.  Patient has history of anxiety and has noted worsening symptoms over the past month.  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  He reports being in recovery for the past year.  Patient did not receive methadone today.  Patient has no history of heart problems, HTN, or DM.  He reports 7/10 substernal pressure that feels like prior anxiety attacks but more severe.  Patient does report escalation of his anxiety today with some sort of disagreement with his father.  He denies SI, HI, psychiatric hospitalizations, or hallucinations. Patient has no radiation of his pain and notes no exacerbating or alleviating factors. He does note some epigastric abdominal pain with nausea but denies G symptoms or other GI symptoms.There are no other associated or modifying factors.  Past Medical History  Diagnosis Date  . Anxiety   . Depression   . Hypertension   . Anxiety   . H/O: substance abuse     History reviewed. No pertinent past surgical history.  No family history on file.  History  Substance Use Topics  . Smoking status: Never Smoker   . Smokeless tobacco: Not on file  . Alcohol Use: Yes      Review of Systems  Constitutional: Negative.   HENT: Negative.   Eyes: Negative.   Respiratory: Negative.   Cardiovascular: Positive for chest pain.  Gastrointestinal: Positive for nausea and abdominal pain.  Genitourinary: Negative.   Musculoskeletal: Negative.   Skin: Negative.   Neurological:  Positive for syncope.  Hematological: Negative.   Psychiatric/Behavioral: Positive for disturbed wake/sleep cycle and decreased concentration. Negative for suicidal ideas, hallucinations, self-injury and dysphoric mood. The patient is nervous/anxious.   All other systems reviewed and are negative.    Allergies  Review of patient's allergies indicates no known allergies.  Home Medications   Current Outpatient Rx  Name Route Sig Dispense Refill  . ALPRAZOLAM 1 MG PO TABS Oral Take 1 mg by mouth every 3 (three) hours as needed. For anxiety     . LISINOPRIL-HYDROCHLOROTHIAZIDE 10-12.5 MG PO TABS Oral Take 1 tablet by mouth daily. 30 tablet 3  . METHADONE HCL 10 MG/ML PO CONC Oral Take 100 mg by mouth daily.     Carma Leaven M PLUS PO TABS Oral Take 1 tablet by mouth daily.      Marland Kitchen RANITIDINE HCL 75 MG PO TABS Oral Take 75 mg by mouth daily as needed. For acid reflux.    Marland Kitchen LORAZEPAM 1 MG PO TABS Oral Take 1 tablet (1 mg total) by mouth 3 (three) times daily as needed for anxiety. 15 tablet 0    BP 159/89  Pulse 97  Temp 98.5 F (36.9 C) (Oral)  Resp 13  SpO2 99%  Physical Exam  Nursing note and vitals reviewed. GEN: Well-developed, well-nourished male in no distress HEENT: Atraumatic, normocephalic. Oropharynx clear without erythema EYES: PERRLA BL, no scleral icterus. NECK: Trachea midline, no meningismus CV: regular rate and rhythm. No murmurs, rubs, or gallops PULM: No respiratory distress.  No crackles, wheezes, or rales. GI: soft, non-tender. No guarding, rebound, or tenderness. + bowel sounds  GU: deferred Neuro: cranial nerves grossly 2-12 intact, no abnormalities of strength or sensation, A and O x 3 MSK: Patient moves all 4 extremities symmetrically, no deformity, edema, or injury noted Skin: No rashes petechiae, purpura, or jaundice Psych: very anxious, denies SI/HI, denies hallucinations   ED Course  Procedures (including critical care time)   Indication: chest  pain Please note this EKG was reviewed extemporaneously by myself.   Date: 02/09/2012  Rate: 96  Rhythm: normal sinus rhythm  QRS Axis: normal  Intervals: normal  ST/T Wave abnormalities: nonspecific T wave changes  Conduction Disutrbances:none  Narrative Interpretation:   Old EKG Reviewed: none available       Labs Reviewed  URINE RAPID DRUG SCREEN (HOSP PERFORMED) - Abnormal; Notable for the following:    Tetrahydrocannabinol POSITIVE (*)     All other components within normal limits  GLUCOSE, CAPILLARY - Abnormal; Notable for the following:    Glucose-Capillary 123 (*)     All other components within normal limits  COMPREHENSIVE METABOLIC PANEL - Abnormal; Notable for the following:    Glucose, Bld 118 (*)     All other components within normal limits  CBC WITH DIFFERENTIAL  LIPASE, BLOOD   Dg Chest 2 View  02/09/2012  *RADIOLOGY REPORT*  Clinical Data: Shortness of breath.  Chest pressure.  High blood pressure.  Nonsmoker.  CHEST - 2 VIEW  Comparison: None.  Findings: No infiltrate, congestive heart failure or pneumothorax. Heart size within normal limits.  IMPRESSION: No acute abnormality.   Original Report Authenticated By: Fuller Canada, M.D.      1. Chest pain, non-cardiac   2. Anxiety       MDM  Patient was evaluated by myself.  He endorsed a syncopal event following extreme anxiety as well as CP consistent with prior anxiety attacks but more severe.  CXR and ECG were unremarkable.  Patient treated with xanax as well as zofran.  Patient also complained of some abdominal pain and abdominal labs showed no anemia, leukocytosis, liver function abnormalities, or signs of pancreatitis.  Patient was feeling better and we agreed that he did not require admission for behavioral health reasons today.  Patient does not have a PCP or a psychiatrist but was referred to [facility] based on ACT team recommendations.  He was told that both resources for evaluation as well as  medication assistance might be available there.  Patient did not have any other concerning risk factors for possible cardiac cause and patient does feel that episode today was likely the result of extreme anxiety.  Patient was discharged in good condition with 15 tabs of ativan for management in the interim.          Cyndra Numbers, MD 02/09/12 2145

## 2012-02-09 NOTE — ED Notes (Signed)
Per EMS, pt was found on floor at [facility] hyperventilating.

## 2012-02-09 NOTE — ED Notes (Signed)
Patient being transported to radiology.will obtain labs upon patient's return 

## 2012-02-14 ENCOUNTER — Emergency Department (HOSPITAL_COMMUNITY)
Admission: EM | Admit: 2012-02-14 | Discharge: 2012-02-14 | Disposition: A | Discharge status: Home / Self Care | Payer: Self-pay | Attending: Emergency Medicine | Admitting: Emergency Medicine

## 2012-02-14 ENCOUNTER — Encounter (HOSPITAL_COMMUNITY): Payer: Self-pay | Admitting: *Deleted

## 2012-02-14 ENCOUNTER — Emergency Department (HOSPITAL_COMMUNITY): Payer: Self-pay

## 2012-02-14 DIAGNOSIS — R42 Dizziness and giddiness: Secondary | ICD-10-CM | POA: Insufficient documentation

## 2012-02-14 DIAGNOSIS — I10 Essential (primary) hypertension: Secondary | ICD-10-CM | POA: Insufficient documentation

## 2012-02-14 DIAGNOSIS — R55 Syncope and collapse: Secondary | ICD-10-CM | POA: Insufficient documentation

## 2012-02-14 DIAGNOSIS — F419 Anxiety disorder, unspecified: Secondary | ICD-10-CM

## 2012-02-14 DIAGNOSIS — Z79899 Other long term (current) drug therapy: Secondary | ICD-10-CM | POA: Insufficient documentation

## 2012-02-14 DIAGNOSIS — R404 Transient alteration of awareness: Secondary | ICD-10-CM | POA: Insufficient documentation

## 2012-02-14 DIAGNOSIS — F411 Generalized anxiety disorder: Secondary | ICD-10-CM | POA: Insufficient documentation

## 2012-02-14 DIAGNOSIS — R000 Tachycardia, unspecified: Secondary | ICD-10-CM | POA: Insufficient documentation

## 2012-02-14 LAB — COMPREHENSIVE METABOLIC PANEL
ALT: 21 U/L (ref 0–53)
BUN: 5 mg/dL — ABNORMAL LOW (ref 6–23)
Calcium: 10.3 mg/dL (ref 8.4–10.5)
GFR calc Af Amer: >90 mL/min (ref >90)
Glucose, Bld: 125 mg/dL — ABNORMAL HIGH (ref 70–99)
Sodium: 139 mEq/L (ref 135–145)
Total Protein: 7.4 g/dL (ref 6.0–8.3)

## 2012-02-14 LAB — CBC WITH DIFFERENTIAL/PLATELET
Eosinophils Absolute: 0.2 10*3/uL (ref 0.0–0.7)
Eosinophils Relative: 2 % (ref 0–5)
Lymphs Abs: 2.5 10*3/uL (ref 0.7–4.0)
MCH: 30.5 pg (ref 26.0–34.0)
MCV: 87.6 fL (ref 78.0–100.0)
Platelets: 292 10*3/uL (ref 150–400)
RBC: 4.99 MIL/uL (ref 4.22–5.81)

## 2012-02-14 LAB — ETHANOL: Alcohol, Ethyl (B): <11 mg/dL (ref 0–11)

## 2012-02-14 LAB — RAPID URINE DRUG SCREEN, HOSP PERFORMED
Amphetamines: NOT DETECTED
Benzodiazepines: NOT DETECTED
Cocaine: NOT DETECTED
Opiates: NOT DETECTED

## 2012-02-14 MED ORDER — SODIUM CHLORIDE 0.9 % IV BOLUS (SEPSIS)
1000.0000 mL | Freq: Once | INTRAVENOUS | Status: AC
  Administered 2012-02-14: 1000 mL via INTRAVENOUS

## 2012-02-14 MED ORDER — LORAZEPAM 2 MG/ML IJ SOLN
2.0000 mg | Freq: Once | INTRAMUSCULAR | Status: AC
  Administered 2012-02-14: 2 mg via INTRAVENOUS
  Filled 2012-02-14: qty 2

## 2012-02-14 MED ORDER — ALPRAZOLAM 0.25 MG PO TABS: 0.2500 mg | ORAL_TABLET | Freq: Three times a day (TID) | ORAL | Status: AC | PRN

## 2012-02-14 MED ORDER — SODIUM CHLORIDE 0.9 % IV SOLN
INTRAVENOUS | Status: DC
  Administered 2012-02-14: 18:00:00 via INTRAVENOUS

## 2012-02-14 NOTE — ED Notes (Signed)
Pt with hx of anxiety attacks presents with c/o syncope and respiratory difficulty. Patient reports dizziness and nausea at this time with heart rate of 130. Patient ambulatory to triage.

## 2012-02-14 NOTE — ED Provider Notes (Signed)
History     CSN: [id number]  Arrival date & time 02/14/12  1548   First MD Initiated Contact with Patient 02/14/12 1655      Chief Complaint  Patient presents with  . Loss of Consciousness    (Consider location/radiation/quality/duration/timing/severity/associated sxs/prior treatment) Patient is a 26 y.o. male presenting with syncope. The history is provided by the patient.  Loss of Consciousness   Patient here with increased anxiety associated with a syncopal that today. Patient was at work and became dizzy and lightheaded and had a brief spell where he passed out. No chest pain or shortness of breath prior to this. A recent leg pain or swelling. When he awoke he got into an argument with a coworker andanxiety became worse. He denies any suicidal or homicidal ideations. No recent alcohol or drug abuse. Past Medical History  Diagnosis Date  . Anxiety   . Depression   . Hypertension   . Anxiety   . H/O: substance abuse     History reviewed. No pertinent past surgical history.  History reviewed. No pertinent family history.  History  Substance Use Topics  . Smoking status: Never Smoker   . Smokeless tobacco: Not on file  . Alcohol Use: Yes      Review of Systems  Cardiovascular: Positive for syncope.  All other systems reviewed and are negative.    Allergies  Review of patient's allergies indicates no known allergies.  Home Medications   Current Outpatient Rx  Name Route Sig Dispense Refill  . LISINOPRIL-HYDROCHLOROTHIAZIDE 10-12.5 MG PO TABS Oral Take 1 tablet by mouth daily.    Marland Kitchen LORAZEPAM 1 MG PO TABS Oral Take 1 mg by mouth every 8 (eight) hours. Anxiety    . METHADONE HCL 10 MG/ML PO CONC Oral Take 100 mg by mouth daily.     Carma Leaven M PLUS PO TABS Oral Take 1 tablet by mouth daily.     Marland Kitchen RANITIDINE HCL 75 MG PO TABS Oral Take 75 mg by mouth daily as needed. For acid reflux.      BP 146/75  Pulse 133  Temp 98.7 F (37.1 C) (Oral)  Resp 28  Ht 6\' 3"   (1.905 m)  Wt 210 lb (95.255 kg)  BMI 26.25 kg/m2  SpO2 100%  Physical Exam  Nursing note and vitals reviewed. Constitutional: He is oriented to person, place, and time. He appears well-developed and well-nourished.  Non-toxic appearance. No distress.  HENT:  Head: Normocephalic and atraumatic.  Eyes: Conjunctivae, EOM and lids are normal. Pupils are equal, round, and reactive to light.  Neck: Normal range of motion. Neck supple. No tracheal deviation present. No mass present.  Cardiovascular: Regular rhythm and normal heart sounds.  Tachycardia present.  Exam reveals no gallop.   No murmur heard. Pulmonary/Chest: Effort normal and breath sounds normal. No stridor. No respiratory distress. He has no decreased breath sounds. He has no wheezes. He has no rhonchi. He has no rales.  Abdominal: Soft. Normal appearance and bowel sounds are normal. He exhibits no distension. There is no tenderness. There is no rebound and no CVA tenderness.  Musculoskeletal: Normal range of motion. He exhibits no edema and no tenderness.  Neurological: He is alert and oriented to person, place, and time. He has normal strength. No cranial nerve deficit or sensory deficit. GCS eye subscore is 4. GCS verbal subscore is 5. GCS motor subscore is 6.  Skin: Skin is warm and dry. No abrasion and no rash noted.  Psychiatric:  His speech is normal and behavior is normal. His mood appears anxious.    ED Course  Procedures (including critical care time)   Labs Reviewed  ETHANOL  URINE RAPID DRUG SCREEN (HOSP PERFORMED)  CBC WITH DIFFERENTIAL  COMPREHENSIVE METABOLIC PANEL   No results found.   No diagnosis found.    MDM  Patient to be treated for his anxiety. Given Ativan here and IV fluids and heart rate greatly improved. Given prescription for Xanax and he is to followup at [facility]   Date: 02/14/2012  Rate: 130  Rhythm: sinus tachycardia  QRS Axis: normal  Intervals: normal  ST/T Wave abnormalities:  nonspecific ST changes  Conduction Disutrbances:none  Narrative Interpretation:   Old EKG Reviewed: none available          Toy Baker, MD 02/14/12 1929

## 2012-02-14 NOTE — ED Notes (Signed)
Discharge instructions reviewed w/ pt., verbalizes understanding. One prescription provided at discharge. Pt states "guess I will go home to more anxiety". Pt verbalized he lives at home, does not get along with father; moved back home the first of the year.

## 2012-03-01 ENCOUNTER — Emergency Department: Payer: Self-pay

## 2012-03-01 LAB — CK TOTAL AND CKMB (NOT AT ARMC)
CK, Total: 155 U/L (ref 35–232)
CK-MB: <0.5 ng/mL — ABNORMAL LOW (ref 0.5–3.6)

## 2012-03-01 LAB — COMPREHENSIVE METABOLIC PANEL
Albumin: 4.5 g/dL (ref 3.4–5.0)
Alkaline Phosphatase: 76 U/L (ref 50–136)
Anion Gap: 11 (ref 7–16)
Bilirubin,Total: 0.3 mg/dL (ref 0.2–1.0)
Calcium, Total: 9.7 mg/dL (ref 8.5–10.1)
Chloride: 99 mmol/L (ref 98–107)
Co2: 25 mmol/L (ref 21–32)
Creatinine: 1.12 mg/dL (ref 0.60–1.30)
EGFR (African American): >60
EGFR (Non-African Amer.): >60
SGOT(AST): 17 U/L (ref 15–37)
SGPT (ALT): 28 U/L (ref 12–78)
Total Protein: 8 g/dL (ref 6.4–8.2)

## 2012-03-01 LAB — CBC
HGB: 16 g/dL (ref 13.0–18.0)
MCH: 31.3 pg (ref 26.0–34.0)
MCHC: 34.1 g/dL (ref 32.0–36.0)
RBC: 5.11 10*6/uL (ref 4.40–5.90)
RDW: 13.4 % (ref 11.5–14.5)

## 2012-09-04 ENCOUNTER — Emergency Department (INDEPENDENT_AMBULATORY_CARE_PROVIDER_SITE_OTHER)
Admission: EM | Admit: 2012-09-04 | Discharge: 2012-09-04 | Disposition: A | Discharge status: Home / Self Care | Payer: Self-pay | Source: Home / Self Care

## 2012-09-04 ENCOUNTER — Telehealth (HOSPITAL_COMMUNITY): Payer: Self-pay | Admitting: *Deleted

## 2012-09-04 ENCOUNTER — Encounter (HOSPITAL_COMMUNITY): Payer: Self-pay | Admitting: Emergency Medicine

## 2012-09-04 LAB — POCT URINALYSIS DIP (DEVICE)
Hgb urine dipstick: NEGATIVE
Ketones, ur: NEGATIVE mg/dL
Leukocytes, UA: NEGATIVE
Protein, ur: NEGATIVE mg/dL
pH: 7 (ref 5.0–8.0)

## 2012-09-04 MED ORDER — CYCLOBENZAPRINE HCL 5 MG PO TABS
5.0000 mg | ORAL_TABLET | Freq: Three times a day (TID) | ORAL | Status: DC | PRN
Start: 2012-09-04 — End: 2013-05-06

## 2012-09-04 MED ORDER — METHYLPREDNISOLONE 4 MG PO KIT: PACK | ORAL | Status: DC

## 2012-09-04 NOTE — ED Provider Notes (Signed)
History     CSN: [id number]  Arrival date & time 09/04/12  1627   First MD Initiated Contact with Patient 09/04/12 1702      Chief Complaint  Patient presents with  . Back Pain    (Consider location/radiation/quality/duration/timing/severity/associated sxs/prior treatment) HPI Comments: 27 year old male presents with low back pain. States 3 days ago he injured his left low back while lifting a heavy object. The pain occurred suddenly and he experienced the pain in the left lumbosacral musculature and down the back of his left leg. He states the pain is worse with any movement and it is constant. Upon entering the room the patient is sitting in a chair leaning far to the right holding his left back. Pain is worse when attempting to arise from a seated position or to sit down on exam table. He denies having direct spinal. He is taking 12 Advil a day and states that is too much that he needs something else. He wants to know definitively what his diagnosis is. He is also complaining of dribbling urine between voids. He denies actually losing control of his bladder. 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 There is a long history of severe anxiety, anxiety associated physical symptoms and hospitalizations. Is also noted that in November of 2011 he had an almost identical type injury and exam as he does today.   Past Medical History  Diagnosis Date  . Anxiety   . Depression   . Hypertension   . Anxiety   . H/O: substance abuse     History reviewed. No pertinent past surgical history.  No family history on file.  History  Substance Use Topics  . Smoking status: Never Smoker   . Smokeless tobacco: Not on file  . Alcohol Use: Yes      Review of Systems  Constitutional: Positive for activity change.  HENT: Negative.   Respiratory: Negative.    Cardiovascular: Negative.   Gastrointestinal: Negative.   Genitourinary:       As per history of present illness  Musculoskeletal: Positive for myalgias, back pain and gait problem.  Skin: Negative.   Neurological: Negative.   Psychiatric/Behavioral: The patient is nervous/anxious.     Allergies  Review of patient's allergies indicates no known allergies.  Home Medications   Current Outpatient Rx  Name  Route  Sig  Dispense  Refill  . lisinopril (PRINIVIL,ZESTRIL) 40 MG tablet   Oral   Take 40 mg by mouth daily.         . cyclobenzaprine (FLEXERIL) 5 MG tablet   Oral   Take 1 tablet (5 mg total) by mouth 3 (three) times daily as needed for muscle spasms.   21 tablet   0   . lisinopril-hydrochlorothiazide (PRINZIDE,ZESTORETIC) 10-12.5 MG per tablet   Oral   Take 1 tablet by mouth daily.         Marland Kitchen LORazepam (ATIVAN) 1 MG tablet   Oral   Take 1 mg by mouth every 8 (eight) hours. Anxiety         . methadone (DOLOPHINE) 10 MG/ML solution   Oral   Take 100 mg by mouth daily.          . methylPREDNISolone (MEDROL DOSEPAK) 4 MG tablet      follow package directions   21 tablet   0   . Multiple Vitamins-Minerals (  MULTIVITAMINS THER. W/MINERALS) TABS   Oral   Take 1 tablet by mouth daily.          . ranitidine (ZANTAC) 75 MG tablet   Oral   Take 75 mg by mouth daily as needed. For acid reflux.           BP 148/76  Pulse 103  Temp(Src) 99 F (37.2 C) (Oral)  Resp 18  SpO2 100%  Physical Exam  Nursing note and vitals reviewed. Constitutional: He is oriented to person, place, and time. He appears well-developed and well-nourished.  HENT:  Head: Normocephalic and atraumatic.  Eyes: EOM are normal. Left eye exhibits no discharge.  Neck: Normal range of motion. Neck supple.  Cardiovascular: Normal rate.   Pulmonary/Chest: Effort normal.  Musculoskeletal:  Tenderness in the left para lumbosacral musculature. Is also tenderness in the left buttock  and posterior thigh. Attempts to perform a straight leg raise produces severe pain in the back of his leg. However, simply lifting his leg and extending to just 45 produces pain in the back of his leg. No spinal tenderness.  Neurological: He is alert and oriented to person, place, and time. No cranial nerve deficit.  Skin: Skin is warm and dry.  Psychiatric: He has a normal mood and affect.    ED Course  Procedures (including critical care time)  Labs Reviewed  POCT URINALYSIS DIP (DEVICE)   No results found.  Results for orders placed during the hospital encounter of 09/04/12  POCT URINALYSIS DIP (DEVICE)      Result Value Range   Glucose, UA NEGATIVE  NEGATIVE mg/dL   Bilirubin Urine NEGATIVE  NEGATIVE   Ketones, ur NEGATIVE  NEGATIVE mg/dL   Specific Gravity, Urine 1.010  1.005 - 1.030   Hgb urine dipstick NEGATIVE  NEGATIVE   pH 7.0  5.0 - 8.0   Protein, ur NEGATIVE  NEGATIVE mg/dL   Urobilinogen, UA 0.2  0.0 - 1.0 mg/dL   Nitrite NEGATIVE  NEGATIVE   Leukocytes, UA NEGATIVE  NEGATIVE      1. Lumbosacral strain, initial encounter       MDM  Pt is histrionic in his clinical presentation. I suspect he is drug seeking.  He will be treated with a Medrol dose pack and flexeril. He will not receive opiates due to his history. Heat and  to areas of pain. Stretches as instructed. Must obtain a PCP for follow up. For worsening or new symptoms go to the ED.  He refused to go to the ED when he was told the only way to perform diagnostic tests in which he was requesting. He wants to wait. Re-emphasied that loss of control of bowel or bladder or unilateral weakness could be a serious sign of spinal injury and should go to the ED if that should occur.        Hayden Rasmussen, NP 09/04/12 1816

## 2012-09-04 NOTE — ED Provider Notes (Signed)
Medical screening examination/treatment/procedure(s) were performed by non-physician practitioner and as supervising physician I was immediately available for consultation/collaboration.  Leslee Home, M.D.  Reuben Likes, MD 09/04/12 2110

## 2012-09-04 NOTE — ED Notes (Signed)
[facility] on [address] called and said pt. was only going to fill the muscle relaxer and not the Medrol dose pak.  Hayden Rasmussen NP with a pt. Discussed with Langston Masker PA and  She said it was OK. Vassie Moselle 09/04/2012

## 2012-09-04 NOTE — ED Notes (Addendum)
Pt c/o lower back pain onset Friday Reports inj back at work while trying to lift a bin containing "wood" that weighed about 400 lbs from a co-worker that was pinned under it Hx of back inj Pain is dull and constant w/intermittent shooting pains; increases w/activity Reports left leg numbness and tingly; also c/o urinary incontinence and urgency and nauseas Denies: f/v/d, hematuria, dysuria Has tried advil, heat/ice for the discomfort  He is alert and oriented w/no signs of acute distress.

## 2013-05-06 ENCOUNTER — Emergency Department (HOSPITAL_COMMUNITY)
Admission: EM | Admit: 2013-05-06 | Discharge: 2013-05-06 | Disposition: A | Discharge status: Home / Self Care | Payer: Self-pay | Attending: Emergency Medicine | Admitting: Emergency Medicine

## 2013-05-06 ENCOUNTER — Emergency Department (HOSPITAL_COMMUNITY): Payer: Self-pay

## 2013-05-06 ENCOUNTER — Encounter (HOSPITAL_COMMUNITY): Payer: Self-pay | Admitting: Emergency Medicine

## 2013-05-06 DIAGNOSIS — R112 Nausea with vomiting, unspecified: Secondary | ICD-10-CM | POA: Insufficient documentation

## 2013-05-06 DIAGNOSIS — Z79899 Other long term (current) drug therapy: Secondary | ICD-10-CM | POA: Insufficient documentation

## 2013-05-06 DIAGNOSIS — I10 Essential (primary) hypertension: Secondary | ICD-10-CM | POA: Insufficient documentation

## 2013-05-06 DIAGNOSIS — R630 Anorexia: Secondary | ICD-10-CM | POA: Insufficient documentation

## 2013-05-06 DIAGNOSIS — Z8659 Personal history of other mental and behavioral disorders: Secondary | ICD-10-CM | POA: Insufficient documentation

## 2013-05-06 DIAGNOSIS — R1110 Vomiting, unspecified: Secondary | ICD-10-CM

## 2013-05-06 DIAGNOSIS — R109 Unspecified abdominal pain: Secondary | ICD-10-CM

## 2013-05-06 DIAGNOSIS — R1032 Left lower quadrant pain: Secondary | ICD-10-CM | POA: Insufficient documentation

## 2013-05-06 LAB — CBC WITH DIFFERENTIAL/PLATELET
Basophils Absolute: 0 10*3/uL (ref 0.0–0.1)
Basophils Relative: 0 % (ref 0–1)
Eosinophils Absolute: 0.1 10*3/uL (ref 0.0–0.7)
Eosinophils Relative: 1 % (ref 0–5)
HCT: 49.8 % (ref 39.0–52.0)
MCH: 32.3 pg (ref 26.0–34.0)
MCHC: 35.5 g/dL (ref 30.0–36.0)
MCV: 90.9 fL (ref 78.0–100.0)
Monocytes Absolute: 0.7 10*3/uL (ref 0.1–1.0)
Neutro Abs: 5.1 10*3/uL (ref 1.7–7.7)
RDW: 13.2 % (ref 11.5–15.5)

## 2013-05-06 LAB — URINALYSIS, ROUTINE W REFLEX MICROSCOPIC
Glucose, UA: NEGATIVE mg/dL
Leukocytes, UA: NEGATIVE
Nitrite: NEGATIVE
Protein, ur: NEGATIVE mg/dL
pH: 7 (ref 5.0–8.0)

## 2013-05-06 LAB — COMPREHENSIVE METABOLIC PANEL
AST: 37 U/L (ref 0–37)
Albumin: 4.4 g/dL (ref 3.5–5.2)
BUN: 7 mg/dL (ref 6–23)
Calcium: 10.1 mg/dL (ref 8.4–10.5)
Creatinine, Ser: 0.94 mg/dL (ref 0.50–1.35)

## 2013-05-06 LAB — LIPASE, BLOOD: Lipase: 19 U/L (ref 11–59)

## 2013-05-06 MED ORDER — DICYCLOMINE HCL 20 MG PO TABS: 20.0000 mg | ORAL_TABLET | Freq: Two times a day (BID) | ORAL | Status: DC

## 2013-05-06 MED ORDER — ONDANSETRON 8 MG PO TBDP: 8.0000 mg | ORAL_TABLET | Freq: Three times a day (TID) | ORAL | Status: DC | PRN

## 2013-05-06 MED ORDER — SODIUM CHLORIDE 0.9 % IV SOLN
1000.0000 mL | Freq: Once | INTRAVENOUS | Status: AC
  Administered 2013-05-06: 1000 mL via INTRAVENOUS

## 2013-05-06 MED ORDER — ONDANSETRON HCL 4 MG/2ML IJ SOLN
4.0000 mg | Freq: Once | INTRAMUSCULAR | Status: AC
  Administered 2013-05-06: 4 mg via INTRAVENOUS
  Filled 2013-05-06: qty 2

## 2013-05-06 MED ORDER — IOHEXOL 300 MG/ML  SOLN
100.0000 mL | Freq: Once | INTRAMUSCULAR | Status: AC | PRN
  Administered 2013-05-06: 100 mL via INTRAVENOUS

## 2013-05-06 MED ORDER — HYDROMORPHONE HCL PF 1 MG/ML IJ SOLN
0.5000 mg | INTRAMUSCULAR | Status: DC | PRN
  Administered 2013-05-06 (×2): 0.5 mg via INTRAVENOUS
  Filled 2013-05-06 (×2): qty 1

## 2013-05-06 MED ORDER — SODIUM CHLORIDE 0.9 % IV SOLN
1000.0000 mL | INTRAVENOUS | Status: DC
  Administered 2013-05-06: 1000 mL via INTRAVENOUS

## 2013-05-06 NOTE — ED Notes (Signed)
Asked patient if they could provide urine sample and patient stated "not right now".

## 2013-05-06 NOTE — ED Provider Notes (Signed)
CSN: [id number]     Arrival date & time 05/06/13  1518 History   First MD Initiated Contact with Patient 05/06/13 1602     Chief Complaint  Patient presents with  . Abdominal Pain  . Nausea    Patient is a 27 y.o. male presenting with abdominal pain. The history is provided by the patient.  Abdominal Pain Pain location:  RLQ Pain quality: sharp   Pain radiates to:  LLQ Pain severity:  Severe Onset quality:  Gradual Duration:  1 day Timing:  Constant Progression:  Waxing and waning Chronicity:  New Context: not recent illness, not recent travel and not trauma   Relieved by:  Nothing Associated symptoms: anorexia, nausea and vomiting   Associated symptoms: no diarrhea (a little bit yesterday), no dysuria and no fever     Past Medical History  Diagnosis Date  . Anxiety   . Depression   . Hypertension   . Anxiety   . H/O: substance abuse    History reviewed. No pertinent past surgical history. History reviewed. No pertinent family history. History  Substance Use Topics  . Smoking status: Never Smoker   . Smokeless tobacco: Not on file  . Alcohol Use: Yes    Review of Systems  Constitutional: Negative for fever.  Gastrointestinal: Positive for nausea, vomiting, abdominal pain and anorexia. Negative for diarrhea (a little bit yesterday).  Genitourinary: Negative for dysuria.  All other systems reviewed and are negative.    Allergies  Review of patient's allergies indicates no known allergies.  Home Medications   Current Outpatient Rx  Name  Route  Sig  Dispense  Refill  . diphenhydrAMINE (BENADRYL) 25 mg capsule   Oral   Take 25 mg by mouth daily as needed for allergies.         Marland Kitchen dicyclomine (BENTYL) 20 MG tablet   Oral   Take 1 tablet (20 mg total) by mouth 2 (two) times daily.   20 tablet   0   . ondansetron (ZOFRAN ODT) 8 MG disintegrating tablet   Oral   Take 1 tablet (8 mg total) by mouth every 8 (eight) hours as needed for nausea or vomiting.   20 tablet   0    BP 157/100  Pulse 91  Temp(Src) 98.7 F (37.1 C) (Oral)  Resp 16  Ht 6\' 3"  (1.905 m)  Wt 201 lb (91.173 kg)  BMI 25.12 kg/m2  SpO2 100% Physical Exam  Nursing note and vitals reviewed. Constitutional: He appears well-developed and well-nourished. No distress.  HENT:  Head: Normocephalic and atraumatic.  Right Ear: External ear normal.  Left Ear: External ear normal.  Eyes: Conjunctivae are normal. Right eye exhibits no discharge. Left eye exhibits no discharge. No scleral icterus.  Neck: Neck supple. No tracheal deviation present.  Cardiovascular: Normal rate, regular rhythm and intact distal pulses.   Pulmonary/Chest: Effort normal and breath sounds normal. No stridor. No respiratory distress. He has no wheezes. He has no rales.  Abdominal: Soft. Bowel sounds are normal. He exhibits no distension. There is tenderness in the right lower quadrant and left lower quadrant. There is guarding. There is no rigidity and no rebound. No hernia.  Musculoskeletal: He exhibits no edema and no tenderness.  Neurological: He is alert. He has normal strength. No sensory deficit. Cranial nerve deficit:  no gross defecits noted. He exhibits normal muscle tone. He displays no seizure activity. Coordination normal.  Skin: Skin is warm and dry. No rash noted.  Psychiatric: He  has a normal mood and affect.    ED Course  Procedures (including critical care time) Labs Review Labs Reviewed  CBC WITH DIFFERENTIAL - Abnormal; Notable for the following:    Hemoglobin 17.7 (*)    All other components within normal limits  COMPREHENSIVE METABOLIC PANEL - Abnormal; Notable for the following:    Glucose, Bld 106 (*)    All other components within normal limits  LIPASE, BLOOD  URINALYSIS, ROUTINE W REFLEX MICROSCOPIC   Imaging Review Ct Abdomen Pelvis W Contrast  05/06/2013   CLINICAL DATA:  Right lower quadrant abdominal pain.  Nausea.  EXAM: CT ABDOMEN AND PELVIS WITH CONTRAST   TECHNIQUE: Multidetector CT imaging of the abdomen and pelvis was performed using the standard protocol following bolus administration of intravenous contrast.  CONTRAST:  OMNIPAQUE IOHEXOL 300 MG/ML  SOLN  COMPARISON:  None.  FINDINGS: The liver, biliary tree, spleen, pancreas, adrenal glands, and kidneys are normal. The bowel is normal including the terminal ileum and appendix. No free air or free fluid. Bladder appears normal. No osseous abnormality. Lung bases are clear.  IMPRESSION: Normal exam.   Electronically Signed   By: Geanie Cooley M.D.   On: 05/06/2013 18:54    EKG Interpretation   None       MDM   1. Abdominal pain   2. Vomiting    The patient's laboratory tests and CAT scan were unremarkable. It is possible the patient has a viral process.  At this time there does not appear to be any evidence of an acute emergency medical condition and the patient appears stable for discharge with appropriate outpatient follow up. Dc home with bentyl and zofran for symptomatic relief.    Celene Kras, MD 05/06/13 2003

## 2013-05-06 NOTE — ED Notes (Signed)
CT made aware pt finished drinking PO contrast. 

## 2013-05-06 NOTE — ED Notes (Signed)
CT called and notified pt is available for oral contrast.

## 2013-05-06 NOTE — ED Notes (Signed)
Pt reports that he started having lower quadrant sharp abd pain yesterday that has gotten worse into today. Reports n/v at home. Reports the pain radiates around into the right side at times. Denies any urinary problems or bowel problems.

## 2013-05-06 NOTE — ED Notes (Signed)
Returned from CT.

## 2013-05-06 NOTE — ED Notes (Signed)
MD at bedside. 

## 2013-08-26 ENCOUNTER — Emergency Department (HOSPITAL_COMMUNITY)
Admission: EM | Admit: 2013-08-26 | Discharge: 2013-08-26 | Disposition: A | Discharge status: Home / Self Care | Payer: Self-pay | Attending: Emergency Medicine | Admitting: Emergency Medicine

## 2013-08-26 ENCOUNTER — Encounter (HOSPITAL_COMMUNITY): Payer: Self-pay | Admitting: Emergency Medicine

## 2013-08-26 DIAGNOSIS — R51 Headache: Secondary | ICD-10-CM | POA: Insufficient documentation

## 2013-08-26 DIAGNOSIS — F1210 Cannabis abuse, uncomplicated: Secondary | ICD-10-CM | POA: Insufficient documentation

## 2013-08-26 DIAGNOSIS — Z8659 Personal history of other mental and behavioral disorders: Secondary | ICD-10-CM | POA: Insufficient documentation

## 2013-08-26 DIAGNOSIS — R5383 Other fatigue: Secondary | ICD-10-CM

## 2013-08-26 DIAGNOSIS — R110 Nausea: Secondary | ICD-10-CM | POA: Insufficient documentation

## 2013-08-26 DIAGNOSIS — R5381 Other malaise: Secondary | ICD-10-CM | POA: Insufficient documentation

## 2013-08-26 DIAGNOSIS — R55 Syncope and collapse: Secondary | ICD-10-CM | POA: Insufficient documentation

## 2013-08-26 DIAGNOSIS — I10 Essential (primary) hypertension: Secondary | ICD-10-CM | POA: Insufficient documentation

## 2013-08-26 LAB — URINALYSIS, ROUTINE W REFLEX MICROSCOPIC
Bilirubin Urine: NEGATIVE
GLUCOSE, UA: NEGATIVE mg/dL
HGB URINE DIPSTICK: NEGATIVE
Ketones, ur: NEGATIVE mg/dL
Leukocytes, UA: NEGATIVE
Nitrite: NEGATIVE
PROTEIN: NEGATIVE mg/dL
Specific Gravity, Urine: 1.014 (ref 1.005–1.030)
Urobilinogen, UA: 0.2 mg/dL (ref 0.0–1.0)
pH: 7 (ref 5.0–8.0)

## 2013-08-26 LAB — RAPID URINE DRUG SCREEN, HOSP PERFORMED
AMPHETAMINES: NOT DETECTED
Barbiturates: NOT DETECTED
Benzodiazepines: NOT DETECTED
COCAINE: NOT DETECTED
OPIATES: NOT DETECTED
TETRAHYDROCANNABINOL: POSITIVE — AB

## 2013-08-26 NOTE — ED Notes (Signed)
Pt presents from home via [facility] with c/o witnessed syncopal episode from sitting that occurred at approx 1350 and lasted for approx 5 minutes. Pt c/o nausea and weakness but denies pain; pt did not fall out of chair, no injuries noted. Pt was diaphoretic,  Pale, and tachycardic at 156 upon EMS arrival, able to decrease heart rate to 116 en route with coaching.

## 2013-08-26 NOTE — Discharge Instructions (Signed)
Please read and follow all provided instructions.  Your diagnoses today include:  1. Syncope     Tests performed today include:  An EKG of your heart - no concerning findings  Urine test - no dehydration  Vital signs. See below for your results today.   Medications prescribed:   None  Take any prescribed medications only as directed.  Follow-up instructions: Please follow-up with the heart doctor listed in the next week for further evaluation. You will likely need to have an ultrasound of your heart and a portable heart tracing monitor.   Please follow-up with primary care, [facility], listed as well.    Return instructions:  SEEK IMMEDIATE MEDICAL ATTENTION IF:  You have severe chest pain, especially if the pain is crushing or pressure-like and spreads to the arms, back, neck, or jaw, or if you have sweating, nausea (feeling sick to your stomach), or shortness of breath. THIS IS AN EMERGENCY. Don't wait to see if the pain will go away. Get medical help at once. Call 911 or 0 (operator). DO NOT drive yourself to the hospital.   Your chest pain gets worse and does not go away with rest.   You have an attack of chest pain lasting longer than usual, despite rest and treatment with the medications your caregiver has prescribed.   You wake from sleep with chest pain or shortness of breath.  You feel dizzy or faint again.   You have chest pain not typical of your usual pain for which you originally saw your caregiver.   You have any other emergent concerns regarding your health.  Additional Information: Chest pain comes from many different causes. Your caregiver has diagnosed you as having chest pain that is not specific for one problem, but does not require admission.  You are at low risk for an acute heart condition or other serious illness.   Your vital signs today were: BP 126/75   Pulse 89   Temp(Src) 98.5 F (36.9 C) (Oral)   Resp 16   SpO2 99% If  your blood pressure (BP) was elevated above 135/85 this visit, please have this repeated by your doctor within one month. --------------  Emergency Department Resource Guide 1) Find a Doctor and Pay Out of Pocket Although you won't have to find out who is covered by your insurance plan, it is a good idea to ask around and get recommendations. You will then need to call the office and see if the doctor you have chosen will accept you as a new patient and what types of options they offer for patients who are self-pay. Some doctors offer discounts or will set up payment plans for their patients who do not have insurance, but you will need to ask so you aren't surprised when you get to your appointment.  2) Contact Your Local Health Department Not all health departments have doctors that can see patients for sick visits, but many do, so it is worth a call to see if yours does. If you don't know where your local health department is, you can check in your phone book. The CDC also has a tool to help you locate your state's health department, and many state websites also have listings of all of their local health departments.  3) Find a Walk-in Clinic If your illness is not likely to be very severe or complicated, you may want to try a walk in clinic. These are popping up all over the country  in pharmacies, drugstores, and shopping centers. They're usually staffed by nurse practitioners or physician assistants that have been trained to treat common illnesses and complaints. They're usually fairly quick and inexpensive. However, if you have serious medical issues or chronic medical problems, these are probably not your best option.  No Primary Care Doctor: - Call Health Connect at  [phone] - they can help you locate a primary care doctor that  accepts your insurance, provides certain services, etc. - Physician Referral Service- [phone]  Chronic Pain Problems: Organization         Address  Phone    Notes  [facility]  [phone] Patients need to be referred by their primary care doctor.   Medication Assistance: Organization         Address  Phone   Notes  [facility] Medication [facility] [address] [place] [place], [place] [address] [phone] --Must be a resident of [facility] -- Must have NO insurance coverage whatsoever (no Medicaid/ Medicare, etc.) -- The pt. MUST have a primary care doctor that directs their care regularly and follows them in the community   [facility]  [phone]   [employer]  [phone]    Agencies that provide inexpensive medical care: Organization         Address  Phone   Notes  [facility]  [phone]   [facility]    [phone]   [facility] [address] [place], [place] [address] [phone]   [facility] [address] [place], [place] [phone]   [facility]anned Parenthood    [phone]   [facility]    [phone]   [facility]  [address], [place] Phone:  [phone], Fax:  [phone] Hours of Operation:  9 am - 6 pm, M-F.  Also accepts Medicaid/Medicare and self-pay.  [facility]  [address], [place] Phone: [phone], Fax: [phone]. Hours of Operation:  8:30 am - 5:30 pm, M-F.  Also accepts Medicaid and self-pay.  [facility] [place] [address] [place] Phone: [phone]   [facility] [address] Natasha Bence [place], [place] [phone], Ext. [phone] Mondays & Thursdays: 7-9 AM.  First 15 patients are seen on a first come, first serve basis.    Medicaid-accepting [facility] Providers:  Organization         Address  Phone   Notes  [facility] [address], [place] [phone] Also accepts self-pay patients.  [facility]  [address] Laurell Josephs [place], [place]  [phone]   [facility] [place] [address], [place] [phone]   [facility] [address] [place] [phone]   Renaye Rakers [address], [place]   [phone] Only accepts [place] Access [place] patients after they have their name applied to their card.   Self-Pay (no insurance) in [facility]:  Organization         Address  Phone   Notes  Sickle Cell Patients, [facility] [address] [place], [place] [phone]   [facility] [address] [place], [place] [phone]   [facility] [place]  [address], [place] [phone]   [facility]/Dr. Osei-Bonsu  [address] [place] or [address], [place] [phone] Phone number for both [place] and [place] locations is the same.  [facility] [address], [place] [phone]   [facility] [place] [address], [place] or [address] [phone]   [facility] [address], [place] [phone], phone; [phone], fax Sees patients 1st and 3rd Saturday of every month.  Must not qualify for public or private insurance (i.e. Medicaid, Medicare, [place] Health Choice, Veterans' Benefits)  Household income should be no more than 200% of the poverty level The clinic cannot treat you if you are pregnant or think you are pregnant  Sexually transmitted diseases are not treated at the clinic.    Dental Care: Organization         Address  Phone  Notes  [facility] Department of [place] [facility] [address] [place], [place] [phone] Accepts children up to age 51 who are enrolled in [place] or [place] Health Choice; pregnant women with a Medicaid card; and children who have  applied for Medicaid or [place] Health Choice, but were declined, whose parents can pay a reduced fee at time of service.  [facility]  [address], [place] [phone] Accepts children up to age 81 who are enrolled in [place] or [place] Health Choice; pregnant women with a Medicaid card; and children who have applied for Medicaid or [place] Health Choice, but were declined, whose parents can pay a reduced fee at time of service.  [facility]  [address] [place], [place] [phone] Patients are seen by appointment only. Walk-ins are not accepted. [facility] will see patients 58 years of age and older. Monday - Tuesday (8am-5pm) Most Wednesdays (8:30-5pm) $30 per visit, cash only  [facility]  [address], [facility] [phone] Patients are seen by appointment only. Walk-ins are not accepted. [facility] will see patients 11 years of age and older. One Wednesday Evening (Monthly: Volunteer Based).  $30 per visit, cash only  [employer]  [phone] for adults; Children under age 69, call Graduate Pediatric Dentistry at [phone]. Children aged 43-14, please call [phone] to request a pediatric application.  Dental services are provided in all areas of dental care including fillings, crowns and bridges, complete and partial dentures, implants, gum treatment, root canals, and extractions. Preventive care is also provided. Treatment is provided to both adults and children. Patients are selected via a lottery and there is often a waiting list.   [facility] [address] [place]  [phone] [email or web address]   [facility] [address] [place], [place] [phone], Ext. [phone] Second and Fourth Thursday of each month, opens at 6:30 AM; Clinic ends at 9 AM.  Patients are seen on a first-come first-served basis, and a  limited number are seen during each clinic.   [facility]  [address] Ether Griffins [place], [place] [phone]   Eligibility Requirements You must have lived in [place], [place], or [place] counties for at least the last three months.   You cannot be eligible for state or federal sponsored [place], including [employer], [place], or [employer].   You generally  cannot be eligible for healthcare insurance through your employer.    How to apply: Eligibility screenings are held every Tuesday and Wednesday afternoon from 1:00 pm until 4:00 pm. You do not need an appointment for the interview!  [facility] [address], [place], [place] [phone]   [facility] Health Department  [phone]   [facility] Health Department  [phone]   [facility] Health Department  [phone]    Behavioral Health Resources in the Community: Intensive Outpatient Programs Organization         Address  Phone  Notes  [facility] [address] [place], [place] [phone]   [facility] Outpatient [address] [place], [place] [phone]   [facility] [address] [place], [place]  [phone]   [facility] Mental Health [address],  [place], [place] [phone] or [phone]   Substance Abuse Resources Organization         Address  Phone  Notes  [facility]  [phone]   [facility]  [phone]   [facility] [place]  [phone]   Floydene Flock  [phone]   Residential & Outpatient Substance Abuse Program  [phone]   Psychological Services Organization         Address  Phone  Notes  [facility]  [phone]   [facility] Services  [phone]   [facility] [place] [facility] [address] [place] [phone] or [phone]    Mobile Crisis Teams Organization          Address  Phone  Notes  [facility], Mobile Crisis Care Unit  [phone]   [facility]  [address] [place], [place] [phone]   Doristine Locks [address] [place] [phone]    Self-Help/Support Groups Organization         Address  Phone             Notes  [facility] [place] - variety of support groups  336- [phone] Call for more information  Narcotics Anonymous (NA), [facility] [address], [employer] [place]  2 meetings at this location   Statistician         Address  Phone  Notes  [facility] [address],    [place]  [phone]   [facility]  [address] [place] [address], [place], [place] [phone]   [facility] [address] [place], [place] [phone] Admissions: 8am-3pm M-F  [facility] [address],    [place], [place] [phone]   [facility] [address] [place], [place], [place] [phone]   [facility] [address]  [place], [place] [phone]   [facility] - Intensive Outpatient [address]ure[address] Joseph[address], [place], [place] [phone]   [facility] ([facility] [address] [place]  [place], [place] [phone] or [phone]   [facility] ([facility]) [address], [place], [place] [phone] Accepts Medicaid  [facility] [address],  [place] [phone] Substance Abuse/Addiction Treatment   [facility] Organization         Address  Phone  Notes  [facility]  [phone]   Angie Fava, PhD [address] Skeeters [place], [place]   [phone] or [phone]   [facility]   [address]  [address] [place], [place] [phone]   [facility] [address] [place], [place] [phone] Insurance/Medicaid/sponsorship  through [facility] and Families [address]                                    [place], [place] [phone] Therapy/tele-psych/case  [facility] [address]   [place], [place] [phone]    Dr. Lolly Mustache  [phone]   [facility]  [employer] [facility] Dept. 1) [address], [place] 2) [address] [place] 3)  [address], [place] [phone]  [phone]   [facility] Child Abuse Hotline [phone])583-0727 or [phone] (After Hours)

## 2013-08-26 NOTE — ED Provider Notes (Signed)
CSN: [id number]     Arrival date & time 08/26/13  1455 History   First MD Initiated Contact with Patient 08/26/13 1456     Chief Complaint  Patient presents with  . Loss of Consciousness     (Consider location/radiation/quality/duration/timing/severity/associated sxs/prior Treatment) HPI Comments: Patient presents with chief complaint of syncope. Patient was sitting in a chair at home having a meal with his family when he lost consciousness without any prodrome. Patient remembers sitting in a chair and then waking up on the floor surrounded by family members. This occurred at approximately 1:30 to 2 PM today. No seizure like activity reported. Per EMS, patient was lowered to the floor by family and did not hit his head. Patient currently complains of mild nausea, generalized weakness, and mild headache. Per EMS patient was diaphoretic, pale and tachycardic to 150 on scene. Patient states that he had a similar episode while sitting at home on his couch about 3 months ago. He did not seek evaluation for this. Patient has no cardiac history. No family history of sudden cardiac death at an early age although older family members have a history of heart disease. Patient has a history of polysubstance abuse but denies alcohol use for several weeks and states that he does not do any drugs currently. Patient exercises by playing basketball. He has not had chest pain, shortness of breath, lightheadedness or syncope performing these activities. He denies any lower extremity edema. He states that he is under a lot of stress but no acute stressors. He states that he has been eating and drinking well. The onset of this condition was acute. The course is resolved. Aggravating factors: none. Alleviating factors: none.    Patient is a 28 y.o. male presenting with syncope. The history is provided by the patient.  Loss of Consciousness Associated symptoms: headaches   Associated symptoms: no chest pain, no diaphoresis,  no fever, no nausea, no palpitations, no shortness of breath and no vomiting     Past Medical History  Diagnosis Date  . Anxiety   . Depression   . Hypertension   . Anxiety   . H/O: substance abuse    History reviewed. No pertinent past surgical history. History reviewed. No pertinent family history. History  Substance Use Topics  . Smoking status: Never Smoker   . Smokeless tobacco: Not on file  . Alcohol Use: Yes    Review of Systems  Constitutional: Positive for fatigue. Negative for fever and diaphoresis.  Eyes: Negative for redness.  Respiratory: Negative for cough and shortness of breath.   Cardiovascular: Positive for syncope. Negative for chest pain, palpitations and leg swelling.  Gastrointestinal: Negative for nausea, vomiting and abdominal pain.  Genitourinary: Negative for dysuria.  Musculoskeletal: Negative for back pain and neck pain.  Skin: Negative for rash.  Neurological: Positive for syncope and headaches. Negative for light-headedness.    Allergies  Review of patient's allergies indicates no known allergies.  Home Medications   Current Outpatient Rx  Name  Route  Sig  Dispense  Refill  . diphenhydrAMINE (BENADRYL) 25 mg capsule   Oral   Take 25 mg by mouth daily as needed for allergies.          BP 150/95  Pulse 106  Temp(Src) 98.5 F (36.9 C) (Oral)  Resp 13  SpO2 99%  Physical Exam  Nursing note and vitals reviewed. Constitutional: He appears well-developed and well-nourished.  HENT:  Head: Normocephalic and atraumatic.  Mouth/Throat: Mucous membranes are normal.  Mucous membranes are not dry.  Eyes: Conjunctivae are normal. Right eye exhibits no discharge. Left eye exhibits no discharge.  Neck: Trachea normal and normal range of motion. Neck supple. Normal carotid pulses and no JVD present. No muscular tenderness present. Carotid bruit is not present. No tracheal deviation present.  Cardiovascular: Normal rate, regular rhythm, S1  normal, S2 normal, normal heart sounds and intact distal pulses.  Exam reveals no gallop, no distant heart sounds, no friction rub and no decreased pulses.   No murmur heard. Pulses:      Radial pulses are 2+ on the right side, and 2+ on the left side.       Dorsalis pedis pulses are 2+ on the right side, and 2+ on the left side.       Posterior tibial pulses are 2+ on the right side, and 2+ on the left side.  Pulmonary/Chest: Effort normal and breath sounds normal. No respiratory distress. He has no wheezes. He exhibits no tenderness.  Abdominal: Soft. Normal aorta and bowel sounds are normal. There is no tenderness. There is no rebound and no guarding.  Musculoskeletal: He exhibits no edema.  Neurological: He is alert.  Skin: Skin is warm and dry. He is not diaphoretic. No cyanosis. No pallor.  Psychiatric: He has a normal mood and affect.    ED Course  Procedures (including critical care time) Labs Review Labs Reviewed  URINE RAPID DRUG SCREEN (HOSP PERFORMED) - Abnormal; Notable for the following:    Tetrahydrocannabinol POSITIVE (*)    All other components within normal limits  URINALYSIS, ROUTINE W REFLEX MICROSCOPIC   Imaging Review No results found.   EKG Interpretation   Date/Time:  Sunday August 26 2013 14:57:15 EDT Ventricular Rate:  110 PR Interval:  157 QRS Duration: 97 QT Interval:  345 QTC Calculation: 467 R Axis:   80 Text Interpretation:  Age not entered, assumed to be  28 years old for  purpose of ECG interpretation Sinus tachycardia Probable left ventricular  hypertrophy unchanged from prior.  Confirmed by DOCHERTY  MD, MEGAN [phone]([id number])  on 08/26/2013 4:02:06 PM      3:53 PM Patient seen and examined. Work-up initiated.   Vital signs reviewed and are as follows: Filed Vitals:   08/26/13 1500  BP: 150/95  Pulse: 106  Temp: 98.5 F (36.9 C)  Resp: 13   Pt d/w Dr. Micheline Mazeocherty. I spoke with Dr. Lourena SimmondsNasser briefly and he looked at EKG. Reccs: He will need ECHO  and event monitor, can be followed by PCP or cards. As pt does not have PCP I have given cards f/u info.   Patient informed of all results. We discussed the importance of following up regarding these episodes. He states he will be able to see cardiology.   Patient instructed to return with additional episodes of syncope, chest pain, or other concerns. He verbalizes understanding and agrees with the plan.   MDM   Final diagnoses:  Syncope   Patient with syncope x2 over the past 3 months, unprovoked, without prodrome. Patient has a normal EKG in emergency department today without QT prolongation, WPW, Brugada syndrome, or other concerning features. No family history of early sudden cardiac death. Possible LVH per EKG, normal heart size on previous chest x-ray. Patient does not give a good story for vasovagal syncope and as such she will need close followup with cardiology. I spoke with cardiology briefly today. Patient given followup information and strict return instructions.  Otherwise no indications for  admission, he appears well and reliable to followup and return if worsening.  Renne Crigler, PA-C 08/26/13 2037

## 2013-08-26 NOTE — ED Notes (Signed)
PT comfortable with discharge and follow up instructions. No prescriptions. 

## 2013-08-26 NOTE — ED Provider Notes (Signed)
Medical screening examination/treatment/procedure(s) were performed by non-physician practitioner and as supervising physician I was immediately available for consultation/collaboration.   EKG Interpretation   Date/Time:  Sunday August 26 2013 14:57:15 EDT Ventricular Rate:  110 PR Interval:  157 QRS Duration: 97 QT Interval:  345 QTC Calculation: 467 R Axis:   80 Text Interpretation:  Age not entered, assumed to be  28 years old for  purpose of ECG interpretation Sinus tachycardia Probable left ventricular  hypertrophy unchanged from prior.  Confirmed by Micheline MazeHERTY  MD, MEGAN [phone]([id number])  on 08/26/2013 4:02:06 PM        Shanna CiscoMegan E Docherty, MD 08/26/13 2212

## 2014-01-25 IMAGING — CR DG CHEST 2V
2 series · 2 of 2 positions shown · non-contrast
Comparison: None.

CLINICAL DATA: Shortness of breath..  Chest pressure.  High blood
pressure.  Nonsmoker.

CHEST - 2 VIEW

[w chest pa]
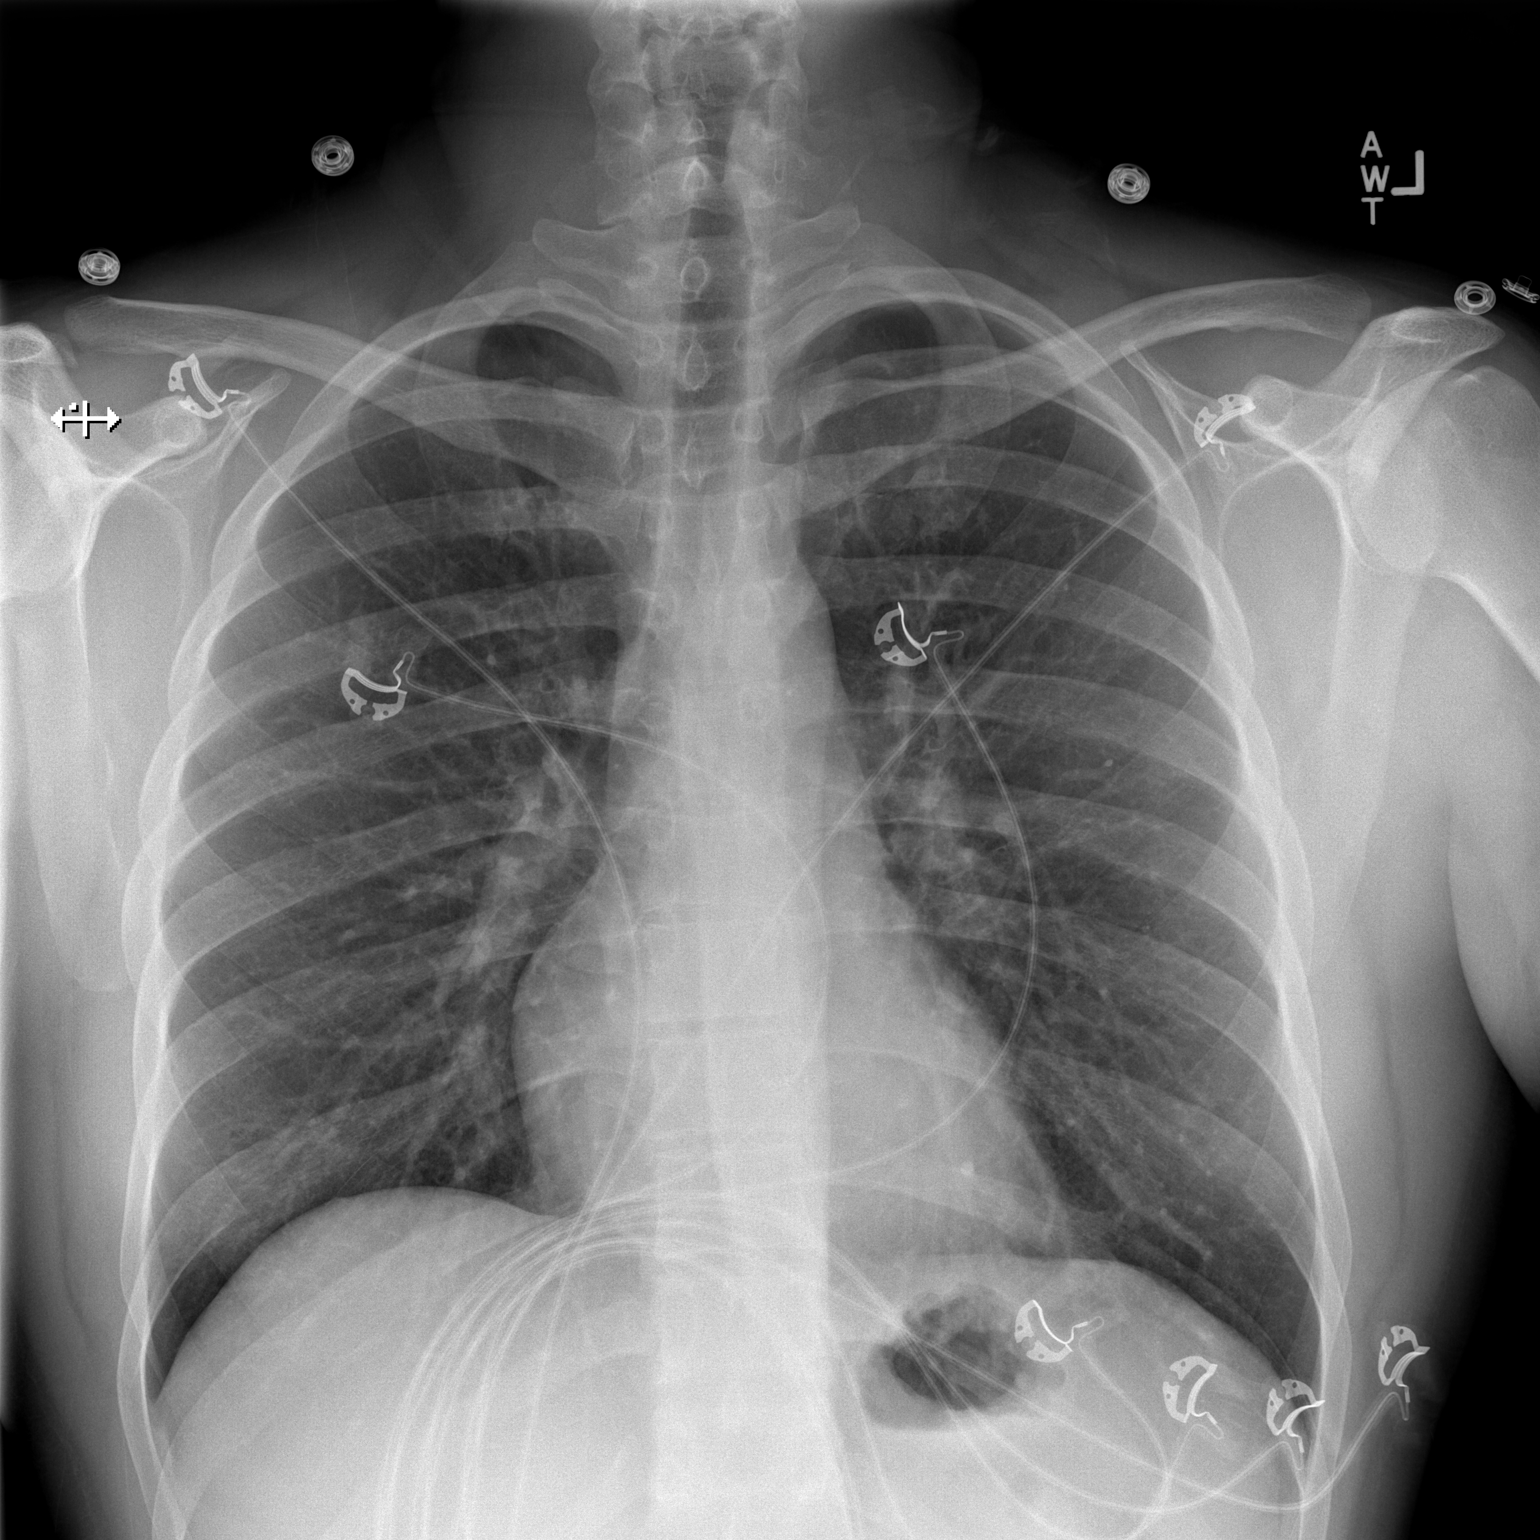

[w chest lat]
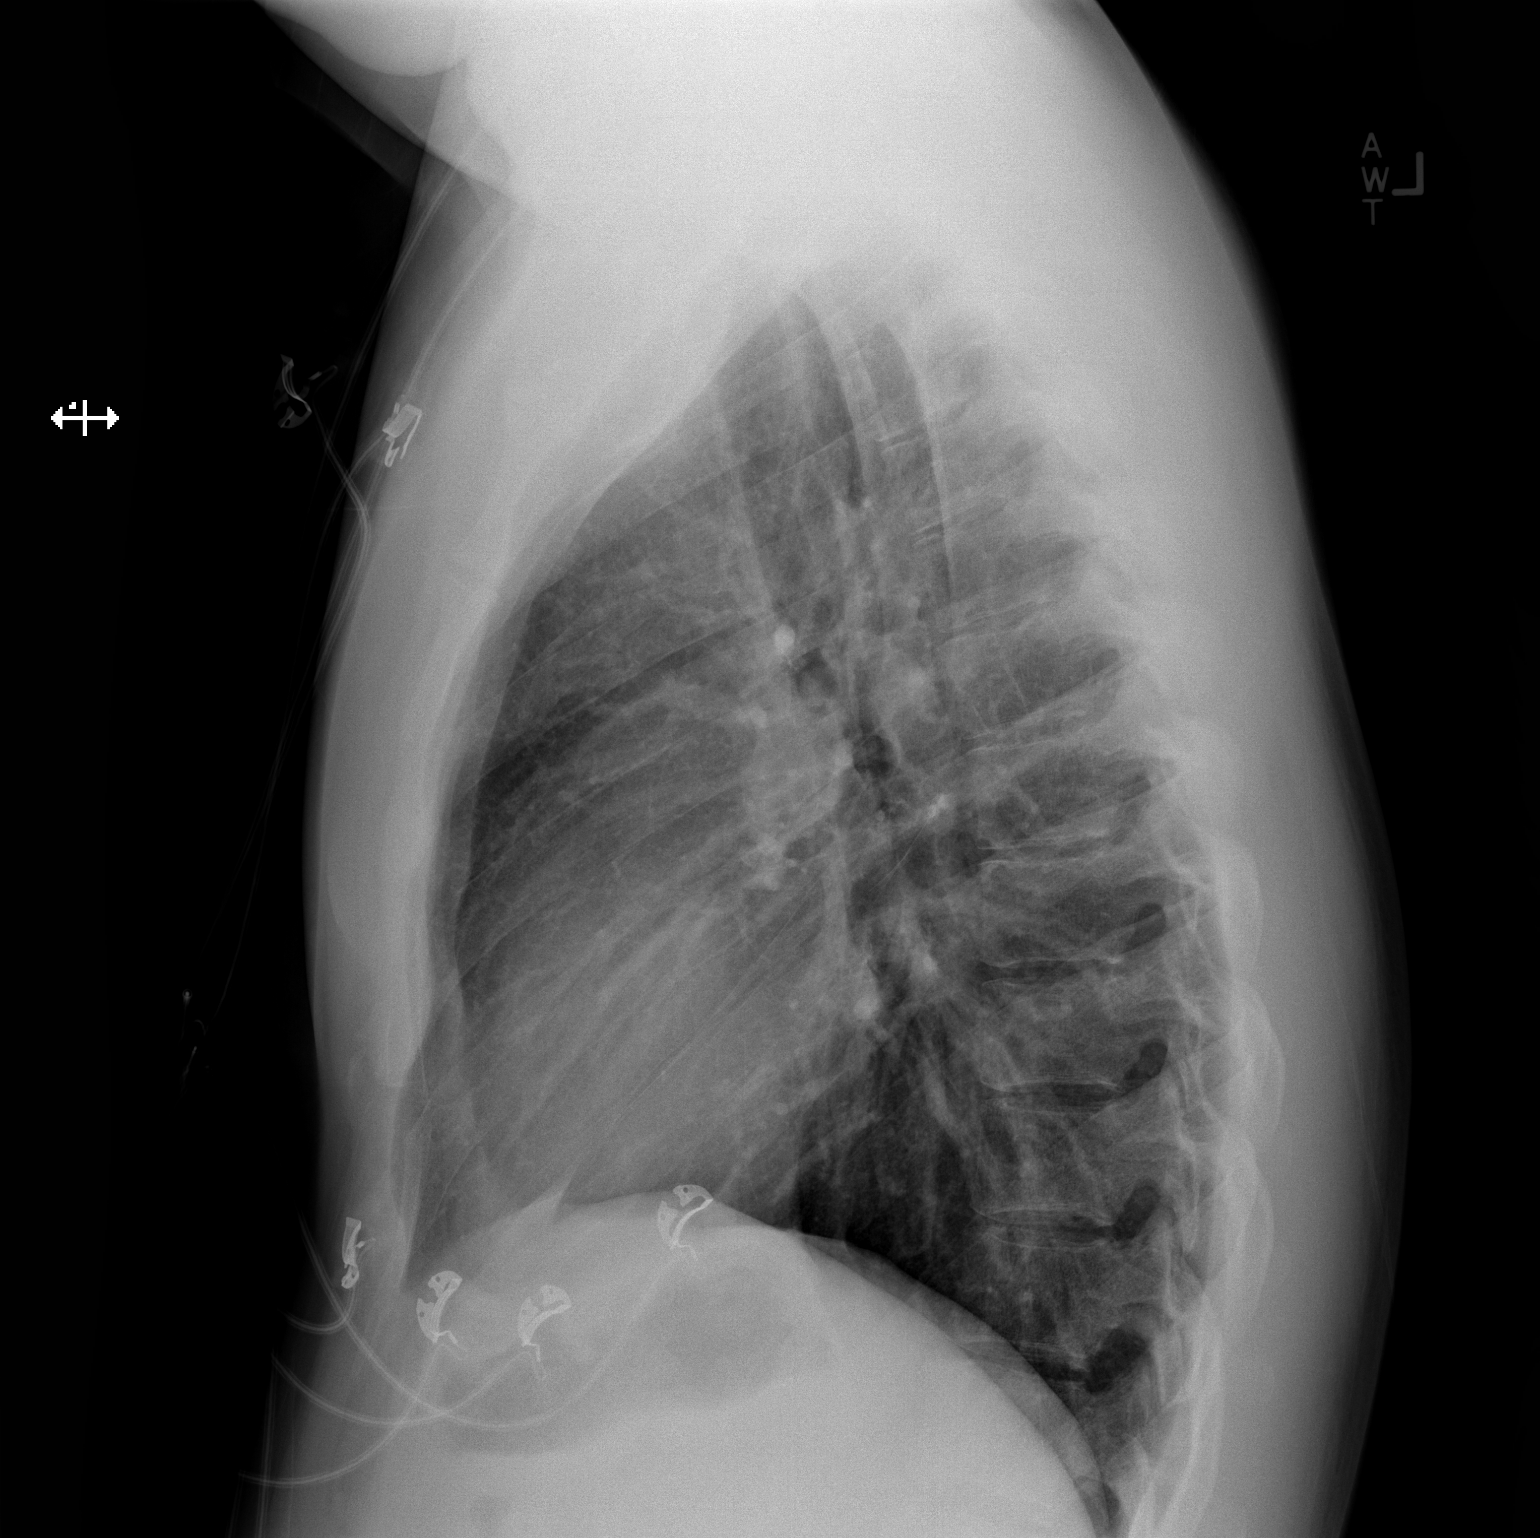

[2 of 2 positions shown; findings below may reference images not displayed]

FINDINGS: No infiltrate, congestive heart failure or pneumothorax..
Heart size within normal limits.
IMPRESSION: No acute abnormality.

## 2014-04-25 ENCOUNTER — Encounter (HOSPITAL_COMMUNITY): Payer: Self-pay | Admitting: Emergency Medicine

## 2014-04-25 ENCOUNTER — Emergency Department (HOSPITAL_COMMUNITY): Payer: Self-pay

## 2014-04-25 ENCOUNTER — Emergency Department (HOSPITAL_COMMUNITY)
Admission: EM | Admit: 2014-04-25 | Discharge: 2014-04-25 | Disposition: A | Discharge status: Home / Self Care | Payer: Self-pay | Attending: Emergency Medicine | Admitting: Emergency Medicine

## 2014-04-25 DIAGNOSIS — Y9367 Activity, basketball: Secondary | ICD-10-CM | POA: Insufficient documentation

## 2014-04-25 DIAGNOSIS — S99919A Unspecified injury of unspecified ankle, initial encounter: Secondary | ICD-10-CM

## 2014-04-25 DIAGNOSIS — Z79899 Other long term (current) drug therapy: Secondary | ICD-10-CM | POA: Insufficient documentation

## 2014-04-25 DIAGNOSIS — Z8659 Personal history of other mental and behavioral disorders: Secondary | ICD-10-CM | POA: Insufficient documentation

## 2014-04-25 DIAGNOSIS — W500XXA Accidental hit or strike by another person, initial encounter: Secondary | ICD-10-CM | POA: Insufficient documentation

## 2014-04-25 DIAGNOSIS — I10 Essential (primary) hypertension: Secondary | ICD-10-CM | POA: Insufficient documentation

## 2014-04-25 DIAGNOSIS — Q688 Other specified congenital musculoskeletal deformities: Secondary | ICD-10-CM | POA: Insufficient documentation

## 2014-04-25 DIAGNOSIS — S99911A Unspecified injury of right ankle, initial encounter: Secondary | ICD-10-CM | POA: Insufficient documentation

## 2014-04-25 DIAGNOSIS — Y92310 Basketball court as the place of occurrence of the external cause: Secondary | ICD-10-CM | POA: Insufficient documentation

## 2014-04-25 MED ORDER — ACETAMINOPHEN 325 MG PO TABS
650.0000 mg | ORAL_TABLET | Freq: Once | ORAL | Status: AC
  Administered 2014-04-25: 650 mg via ORAL
  Filled 2014-04-25: qty 2

## 2014-04-25 MED ORDER — IBUPROFEN 800 MG PO TABS: 800.0000 mg | ORAL_TABLET | Freq: Three times a day (TID) | ORAL | Status: DC

## 2014-04-25 NOTE — ED Provider Notes (Signed)
CSN: [id number]     Arrival date & time 04/25/14  1505 History  This chart was scribed for non-physician practitioner Junious SilkHannah Zyanya Glaza, working with Gilda Creasehristopher J. Pollina, MD by Littie Deedsichard Sun, ED Scribe. This patient was seen in room WTR6/WTR6 and the patient's care was started at 3:23 PM.     Chief Complaint  Patient presents with  . Leg Injury   The history is provided by the patient. No language interpreter was used.   HPI Comments: Eric Keller is a 28 y.o. male brought in by EMS who presents to the Emergency Department complaining of sudden onset, constant right leg pain following an injury when the patient was playing basketball PTA. Patient states he came down on his right leg wrong and collided with another player's foot. The pain is described as feeling as if "someone hit him with a club." Patient is unable to bear weight. 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 Patient denies other injuries and other medical problems.  Past Medical History  Diagnosis Date  . Anxiety   . Depression   . Hypertension   . Anxiety   . H/O: substance abuse    History reviewed. No pertinent past surgical history. No family history on file. History  Substance Use Topics  . Smoking status: Never Smoker   . Smokeless tobacco: Not on file  . Alcohol Use: Yes    Review of Systems  Musculoskeletal: Positive for myalgias and arthralgias.  All other systems reviewed and are negative.     Allergies  Review of patient's allergies indicates no known allergies.  Home Medications   Prior to Admission medications   Medication Sig Start Date End Date Taking? Authorizing Provider  diphenhydrAMINE (BENADRYL) 25 mg capsule Take 25 mg by mouth daily as needed for allergies.    Historical Provider, MD   BP 182/97 mmHg  Pulse 90  Temp(Src) 98.9 F (37.2 C) (Oral)  Resp 16  SpO2 100% Physical Exam  Constitutional: He is oriented to person, place, and time. He appears  well-developed and well-nourished. No distress.  HENT:  Head: Normocephalic and atraumatic.  Right Ear: External ear normal.  Left Ear: External ear normal.  Nose: Nose normal.  Eyes: Conjunctivae are normal.  Neck: Normal range of motion. No tracheal deviation present.  Cardiovascular: Normal rate, regular rhythm and normal heart sounds.   Pulmonary/Chest: Effort normal and breath sounds normal. No stridor.  Abdominal: Soft. He exhibits no distension. There is no tenderness.  Musculoskeletal: Normal range of motion. He exhibits tenderness.       Feet:  TTP to right lateral malleolus. Associated swelling Squeeze test negative. No breaks in the skin  Neurological: He is alert and oriented to person, place, and time.  Compartment soft. Neurological intact.  Skin: Skin is warm and dry. He is not diaphoretic.  Psychiatric: He has a normal mood and affect. His behavior is normal.  Nursing note and vitals reviewed.   ED Course  Procedures  DIAGNOSTIC STUDIES: Oxygen Saturation is 100% on room air, normal by my interpretation.    COORDINATION OF CARE: 3:26 PM-Discussed treatment plan which includes medication and imaging with pt at bedside and pt agreed to plan.   Labs Review Labs Reviewed - No data to display  Imaging Review Dg Tibia/fibula Right  04/25/2014   CLINICAL DATA:  Injury to the right lower leg and ankle while playing basketball earlier today. Patient states that he fell onto the right lower leg and collided  with another player is foot. Patient unable to bear weight currently. Initial encounter.  EXAM: RIGHT TIBIA AND FIBULA - 2 VIEW  COMPARISON:  Right ankle x-rays obtained concurrently.  FINDINGS: No acute fracture involving the tibia or fibula. Well preserved bone mineral density. No intrinsic osseous abnormality. Visualized knee joint and ankle joint intact. Very small round metallic foreign body in the soft tissues of the posterolateral lower leg, distal 1/3.   IMPRESSION: No osseous abnormality.   Electronically Signed   By: Hulan Saashomas  Lawrence M.D.   On: 04/25/2014 15:51   Dg Ankle Complete Right  04/25/2014   CLINICAL DATA:  Injury to the right lower leg and ankle while playing basketball earlier today. Patient states that he fell onto the right lower leg and collided with another player's foot. Patient unable to bear weight currently. Initial encounter.  EXAM: RIGHT ANKLE - COMPLETE 3+ VIEW  COMPARISON:  Right tibia fibula x-rays obtained concurrently.  FINDINGS: Mild lateral soft tissue swelling. Accessory ossicle posterior to the talus (os trigonum) favored over a fracture involving the superior surface of the calcaneus. No evidence of acute fracture elsewhere. Ankle mortise intact with well-preserved joint space. Well-preserved bone mineral density. No intrinsic osseous abnormalities. No visible joint effusion.  IMPRESSION: Accessory ossicle posterior to the talus is favored over a fracture involving the superior surface of the calcaneus, as this is the location for the os trigonum.   Electronically Signed   By: Hulan Saashomas  Lawrence M.D.   On: 04/25/2014 16:05     EKG Interpretation None      MDM   Final diagnoses:  Ankle injury  Os trigonum   Patient presents emergency department for evaluation of right ankle injury. Prior to arrival he was playing basketball when he came down on his leg. X-ray shows accessory ossicle posterior to the talus is favored very fracture involving the superior surface of the calcaneus. Will give ASO brace and crutches. Encouraged strict orthopedic follow-up. Discussed case with Dr. Blinda LeatherwoodPollina who agrees with plan. Discussed reasons to return to ED immediately. Vital signs stable for discharge. Patient / Family / Caregiver informed of clinical course, understand medical decision-making process, and agree with plan.   I personally performed the services described in this documentation, which was scribed in my presence. The  recorded information has been reviewed and is accurate.    Mora BellmanHannah S Jarrick Fjeld, PA-C 04/25/14 [id number]  Gilda Creasehristopher J. Pollina, MD 04/26/14 [phone]

## 2014-04-25 NOTE — ED Notes (Signed)
Per EMS-came down on right leg wrong playing basketball-refused pain meds-in recovery

## 2014-10-03 ENCOUNTER — Emergency Department (INDEPENDENT_AMBULATORY_CARE_PROVIDER_SITE_OTHER)
Admission: EM | Admit: 2014-10-03 | Discharge: 2014-10-03 | Disposition: A | Discharge status: Home / Self Care | Payer: Self-pay | Source: Home / Self Care | Attending: Family Medicine | Admitting: Family Medicine

## 2014-10-03 ENCOUNTER — Encounter (HOSPITAL_COMMUNITY): Payer: Self-pay | Admitting: *Deleted

## 2014-10-03 DIAGNOSIS — A084 Viral intestinal infection, unspecified: Secondary | ICD-10-CM

## 2014-10-03 MED ORDER — ONDANSETRON HCL 4 MG PO TABS: 4.0000 mg | ORAL_TABLET | Freq: Four times a day (QID) | ORAL | Status: DC

## 2014-10-03 NOTE — ED Notes (Signed)
Pt  reports  Symptoms  Of nausea      Diarrhea      As  Well  As  Low  abd  Cramping  With  Onset of  Symptoms  X  3  Days    Pt  Reports  He  Vomited  X  1  Today     Early  This  Am      - reports  Family  Members with  Similar  Symptoms      Pt  Is  Sitting upright on the  Exam table  In  No  Severe      Distress

## 2014-10-03 NOTE — Discharge Instructions (Signed)
Viral Gastroenteritis Viral gastroenteritis is also known as stomach flu. This condition affects the stomach and intestinal tract. It can cause sudden diarrhea and vomiting. The illness typically lasts 3 to 8 days. Most people develop an immune response that eventually gets rid of the virus. While this natural response develops, the virus can make you quite ill. CAUSES  Many different viruses can cause gastroenteritis, such as rotavirus or noroviruses. You can catch one of these viruses by consuming contaminated food or water. You may also catch a virus by sharing utensils or other personal items with an infected person or by touching a contaminated surface. SYMPTOMS  The most common symptoms are diarrhea and vomiting. These problems can cause a severe loss of body fluids (dehydration) and a body salt (electrolyte) imbalance. Other symptoms may include:  Fever.  Headache.  Fatigue.  Abdominal pain. DIAGNOSIS  Your caregiver can usually diagnose viral gastroenteritis based on your symptoms and a physical exam. A stool sample may also be taken to test for the presence of viruses or other infections. TREATMENT  This illness typically goes away on its own. Treatments are aimed at rehydration. The most serious cases of viral gastroenteritis involve vomiting so severely that you are not able to keep fluids down. In these cases, fluids must be given through an intravenous line (IV). HOME CARE INSTRUCTIONS   Drink enough fluids to keep your urine clear or pale yellow. Drink small amounts of fluids frequently and increase the amounts as tolerated.  Ask your caregiver for specific rehydration instructions.  Avoid:  Foods high in sugar.  Alcohol.  Carbonated drinks.  Tobacco.  Juice.  Caffeine drinks.  Extremely hot or cold fluids.  Fatty, greasy foods.  Too much intake of anything at one time.  Dairy products until 24 to 48 hours after diarrhea stops.  You may consume probiotics.  Probiotics are active cultures of beneficial bacteria. They may lessen the amount and number of diarrheal stools in adults. Probiotics can be found in yogurt with active cultures and in supplements.  Wash your hands well to avoid spreading the virus.  Only take over-the-counter or prescription medicines for pain, discomfort, or fever as directed by your caregiver. Do not give aspirin to children. Antidiarrheal medicines are not recommended.  Ask your caregiver if you should continue to take your regular prescribed and over-the-counter medicines.  Keep all follow-up appointments as directed by your caregiver. SEEK IMMEDIATE MEDICAL CARE IF:   You are unable to keep fluids down.  You do not urinate at least once every 6 to 8 hours.  You develop shortness of breath.  You notice blood in your stool or vomit. This may look like coffee grounds.  You have abdominal pain that increases or is concentrated in one small area (localized).  You have persistent vomiting or diarrhea.  You have a fever.  The patient is a child younger than 3 months, and he or she has a fever.  The patient is a child older than 3 months, and he or she has a fever and persistent symptoms.  The patient is a child older than 3 months, and he or she has a fever and symptoms suddenly get worse.  The patient is a baby, and he or she has no tears when crying. MAKE SURE YOU:   Understand these instructions.  Will watch your condition.  Will get help right away if you are not doing well or get worse. Document Released: 06/07/2005 Document Revised: 08/30/2011 Document Reviewed: 03/24/2011   ExitCare Patient Information 2015 [employer]. This information is not intended to replace advice given to you by your health care provider. Make sure you discuss any questions you have with your health care provider.   Food Choices to Help Relieve Diarrhea When you have diarrhea, the foods you eat and your eating habits are  very important. Choosing the right foods and drinks can help relieve diarrhea. Also, because diarrhea can last up to 7 days, you need to replace lost fluids and electrolytes (such as sodium, potassium, and chloride) in order to help prevent dehydration.  WHAT GENERAL GUIDELINES DO I NEED TO FOLLOW?  Slowly drink 1 cup (8 oz) of fluid for each episode of diarrhea. If you are getting enough fluid, your urine will be clear or pale yellow.  Eat starchy foods. Some good choices include white rice, white toast, pasta, low-fiber cereal, baked potatoes (without the skin), saltine crackers, and bagels.  Avoid large servings of any cooked vegetables.  Limit fruit to two servings per day. A serving is  cup or 1 small piece.  Choose foods with less than 2 g of fiber per serving.  Limit fats to less than 8 tsp (38 g) per day.  Avoid fried foods.  Eat foods that have probiotics in them. Probiotics can be found in certain dairy products.  Avoid foods and beverages that may increase the speed at which food moves through the stomach and intestines (gastrointestinal tract). Things to avoid include:  High-fiber foods, such as dried fruit, raw fruits and vegetables, nuts, seeds, and whole grain foods.  Spicy foods and high-fat foods.  Foods and beverages sweetened with high-fructose corn syrup, honey, or sugar alcohols such as xylitol, sorbitol, and mannitol. WHAT FOODS ARE RECOMMENDED? Grains White rice. White, French, or pita breads (fresh or toasted), including plain rolls, buns, or bagels. White pasta. Saltine, soda, or graham crackers. Pretzels. Low-fiber cereal. Cooked cereals made with water (such as cornmeal, farina, or cream cereals). Plain muffins. Matzo. Melba toast. Zwieback.  Vegetables Potatoes (without the skin). Strained tomato and vegetable juices. Most well-cooked and canned vegetables without seeds. Tender lettuce. Fruits Cooked or canned applesauce, apricots, cherries, fruit  cocktail, grapefruit, peaches, pears, or plums. Fresh bananas, apples without skin, cherries, grapes, cantaloupe, grapefruit, peaches, oranges, or plums.  Meat and Other Protein Products Baked or boiled chicken. Eggs. Tofu. Fish. Seafood. Smooth peanut butter. Ground or well-cooked tender beef, ham, veal, lamb, pork, or poultry.  Dairy Plain yogurt, kefir, and unsweetened liquid yogurt. Lactose-free milk, buttermilk, or soy milk. Plain hard cheese. Beverages Sport drinks. Clear broths. Diluted fruit juices (except prune). Regular, caffeine-free sodas such as ginger ale. Water. Decaffeinated teas. Oral rehydration solutions. Sugar-free beverages not sweetened with sugar alcohols. Other Bouillon, broth, or soups made from recommended foods.  The items listed above may not be a complete list of recommended foods or beverages. Contact your dietitian for more options. WHAT FOODS ARE NOT RECOMMENDED? Grains Whole grain, whole wheat, bran, or rye breads, rolls, pastas, crackers, and cereals. Wild or brown rice. Cereals that contain more than 2 g of fiber per serving. Corn tortillas or taco shells. Cooked or dry oatmeal. Granola. Popcorn. Vegetables Raw vegetables. Cabbage, broccoli, Brussels sprouts, artichokes, baked beans, beet greens, corn, kale, legumes, peas, sweet potatoes, and yams. Potato skins. Cooked spinach and cabbage. Fruits Dried fruit, including raisins and dates. Raw fruits. Stewed or dried prunes. Fresh apples with skin, apricots, mangoes, pears, raspberries, and strawberries.  Meat and Other Protein Products Chunky   peanut butter. Nuts and seeds. Beans and lentils. Bacon.  Dairy High-fat cheeses. Milk, chocolate milk, and beverages made with milk, such as milk shakes. Cream. Ice cream. Sweets and Desserts Sweet rolls, doughnuts, and sweet breads. Pancakes and waffles. Fats and Oils Butter. Cream sauces. Margarine. Salad oils. Plain salad dressings. Olives. Avocados.   Beverages Caffeinated beverages (such as coffee, tea, soda, or energy drinks). Alcoholic beverages. Fruit juices with pulp. Prune juice. Soft drinks sweetened with high-fructose corn syrup or sugar alcohols. Other Coconut. Hot sauce. Chili powder. Mayonnaise. Gravy. Cream-based or milk-based soups.  The items listed above may not be a complete list of foods and beverages to avoid. Contact your dietitian for more information. WHAT SHOULD I DO IF I BECOME DEHYDRATED? Diarrhea can sometimes lead to dehydration. Signs of dehydration include dark urine and dry mouth and skin. If you think you are dehydrated, you should rehydrate with an oral rehydration solution. These solutions can be purchased at pharmacies, retail stores, or online.  Drink -1 cup (120-240 mL) of oral rehydration solution each time you have an episode of diarrhea. If drinking this amount makes your diarrhea worse, try drinking smaller amounts more often. For example, drink 1-3 tsp (5-15 mL) every 5-10 minutes.  A general rule for staying hydrated is to drink 1-2 L of fluid per day. Talk to your health care provider about the specific amount you should be drinking each day. Drink enough fluids to keep your urine clear or pale yellow. Document Released: 08/28/2003 Document Revised: 06/12/2013 Document Reviewed: 04/30/2013 Exit[employer] Patient Information 2015 [employer]. This information is not intended to replace advice given to you by your health care provider. Make sure you discuss any questions you have with your health care provider.  

## 2014-10-03 NOTE — ED Provider Notes (Signed)
CSN: [id number]     Arrival date & time 10/03/14  1318 History   First MD Initiated Contact with Patient 10/03/14 1453     Chief Complaint  Patient presents with  . Nausea   (Consider location/radiation/quality/duration/timing/severity/associated sxs/prior Treatment) HPI Comments: 29 year old male with nausea, vomiting and diarrhea for 4-5 days. It is associated with intermittent lower abdominal cramping. He is had just one episode of vomiting earlier this morning. He had 2-3 episodes of diarrhea stools yesterday and 2 today so far. Denies frank abdominal pain. He is able to hold down some liquids.   Past Medical History  Diagnosis Date  . Anxiety   . Depression   . Hypertension   . Anxiety   . H/O: substance abuse    History reviewed. No pertinent past surgical history. History reviewed. No pertinent family history. History  Substance Use Topics  . Smoking status: Never Smoker   . Smokeless tobacco: Not on file  . Alcohol Use: Yes    Review of Systems  Constitutional: Positive for activity change and appetite change. Negative for fever.  HENT: Negative.   Respiratory: Negative.   Cardiovascular: Negative.   Gastrointestinal: Negative for blood in stool.       As per history of present illness  Genitourinary: Negative.   Skin: Negative.   Neurological: Negative.     Allergies  Review of patient's allergies indicates no known allergies.  Home Medications   Prior to Admission medications   Medication Sig Start Date End Date Taking? Authorizing Provider  ondansetron (ZOFRAN) 4 MG tablet Take 1 tablet (4 mg total) by mouth every 6 (six) hours. 10/03/14   Hayden Rasmussenavid Malaijah Houchen, NP   BP 134/84 mmHg  Pulse 67  Temp(Src) 99.2 F (37.3 C) (Oral)  Resp 16  SpO2 98% Physical Exam  Constitutional: He is oriented to person, place, and time. He appears well-developed and well-nourished. No distress.  Neck: Normal range of motion. Neck supple.  Cardiovascular: Normal rate.    Pulmonary/Chest: Effort normal and breath sounds normal.  Abdominal: Soft. He exhibits no distension and no mass. There is no rebound and no guarding.  Palpation of the epigastrium produces increased nausea. No true tenderness.  Neurological: He is alert and oriented to person, place, and time. He exhibits normal muscle tone.  Skin: Skin is warm and dry.  Psychiatric: He has a normal mood and affect.  Nursing note and vitals reviewed.   ED Course  Procedures (including critical care time) Labs Review Labs Reviewed - No data to display  Imaging Review No results found.   MDM   1. Viral gastroenteritis    zofran immodium ad as directed for stools >4-5 /6 hours Pedialyte, clear liquids    Hayden Rasmussenavid Kripa Foskey, NP 10/03/14 1513

## 2015-10-07 ENCOUNTER — Encounter (HOSPITAL_COMMUNITY): Payer: Self-pay | Admitting: *Deleted

## 2015-10-07 ENCOUNTER — Ambulatory Visit (HOSPITAL_COMMUNITY)
Admission: EM | Admit: 2015-10-07 | Discharge: 2015-10-07 | Disposition: A | Discharge status: Home / Self Care | Payer: Self-pay | Attending: Family Medicine | Admitting: Family Medicine

## 2015-10-07 DIAGNOSIS — Z79899 Other long term (current) drug therapy: Secondary | ICD-10-CM | POA: Insufficient documentation

## 2015-10-07 DIAGNOSIS — I10 Essential (primary) hypertension: Secondary | ICD-10-CM | POA: Insufficient documentation

## 2015-10-07 DIAGNOSIS — J111 Influenza due to unidentified influenza virus with other respiratory manifestations: Secondary | ICD-10-CM

## 2015-10-07 DIAGNOSIS — F419 Anxiety disorder, unspecified: Secondary | ICD-10-CM | POA: Insufficient documentation

## 2015-10-07 DIAGNOSIS — R69 Illness, unspecified: Secondary | ICD-10-CM

## 2015-10-07 DIAGNOSIS — J029 Acute pharyngitis, unspecified: Secondary | ICD-10-CM | POA: Insufficient documentation

## 2015-10-07 DIAGNOSIS — F329 Major depressive disorder, single episode, unspecified: Secondary | ICD-10-CM | POA: Insufficient documentation

## 2015-10-07 LAB — POCT RAPID STREP A: Streptococcus, Group A Screen (Direct): NEGATIVE

## 2015-10-07 MED ORDER — IBUPROFEN 800 MG PO TABS: 800.0000 mg | ORAL_TABLET | Freq: Three times a day (TID) | ORAL | Status: DC

## 2015-10-07 NOTE — ED Provider Notes (Signed)
CSN: [id number]     Arrival date & time 10/07/15  1446 History   First MD Initiated Contact with Patient 10/07/15 1558     Chief Complaint  Patient presents with  . Sore Throat   (Consider location/radiation/quality/duration/timing/severity/associated sxs/prior Treatment) Patient is a 30 y.o. male presenting with pharyngitis. The history is provided by the patient.  Sore Throat This is a new problem. The current episode started more than 2 days ago. The problem has not changed since onset.Pertinent negatives include no chest pain, no abdominal pain, no headaches and no shortness of breath.    Past Medical History  Diagnosis Date  . Anxiety   . Depression   . Hypertension   . Anxiety   . H/O: substance abuse    History reviewed. No pertinent past surgical history. History reviewed. No pertinent family history. Social History  Substance Use Topics  . Smoking status: Never Smoker   . Smokeless tobacco: None  . Alcohol Use: Yes    Review of Systems  Constitutional: Positive for fever. Negative for chills.  HENT: Positive for postnasal drip and sore throat.   Respiratory: Negative.  Negative for shortness of breath.   Cardiovascular: Negative.  Negative for chest pain.  Gastrointestinal: Negative.  Negative for abdominal pain.  Genitourinary: Negative.   Neurological: Negative for headaches.  All other systems reviewed and are negative.   Allergies  Review of patient's allergies indicates no known allergies.  Home Medications   Prior to Admission medications   Medication Sig Start Date End Date Taking? Authorizing Provider  ibuprofen (ADVIL,MOTRIN) 800 MG tablet Take 1 tablet (800 mg total) by mouth 3 (three) times daily. 10/07/15   Linna HoffJames D Andersen Iorio, MD  ondansetron (ZOFRAN) 4 MG tablet Take 1 tablet (4 mg total) by mouth every 6 (six) hours. 10/03/14   Hayden Rasmussenavid Mabe, NP   Meds Ordered and Administered this Visit  Medications - No data to display  BP 145/68 mmHg  Pulse 62   Temp(Src) 98.9 F (37.2 C) (Oral)  Resp 16  SpO2 99% No data found.   Physical Exam  Constitutional: He is oriented to person, place, and time. He appears well-developed and well-nourished.  HENT:  Right Ear: External ear normal.  Left Ear: External ear normal.  Mouth/Throat: Oropharynx is clear and moist.  Eyes: Conjunctivae are normal. Pupils are equal, round, and reactive to light.  Neck: Normal range of motion. Neck supple.  Cardiovascular: Normal rate, normal heart sounds and intact distal pulses.   Pulmonary/Chest: Effort normal and breath sounds normal.  Lymphadenopathy:    He has no cervical adenopathy.  Neurological: He is alert and oriented to person, place, and time.  Skin: Skin is warm and dry.  Nursing note and vitals reviewed.   ED Course  Procedures (including critical care time)  Labs Review Labs Reviewed  POCT RAPID STREP A   Strep neg.  Imaging Review No results found.   Visual Acuity Review  Right Eye Distance:   Left Eye Distance:   Bilateral Distance:    Right Eye Near:   Left Eye Near:    Bilateral Near:         MDM   1. Influenza-like illness        Linna HoffJames D Lynzee Lindquist, MD 10/07/15 [phone]

## 2015-10-07 NOTE — ED Notes (Signed)
Sorethroat    And  Cough           Hoarse   And  Body  Aches   Symptoms  X   4  Days    Symptoms  Getting  Worse

## 2015-10-10 LAB — CULTURE, GROUP A STREP (THRC)

## 2015-10-11 ENCOUNTER — Telehealth (HOSPITAL_COMMUNITY): Payer: Self-pay | Admitting: Emergency Medicine

## 2015-10-11 ENCOUNTER — Telehealth: Payer: Self-pay | Admitting: Internal Medicine

## 2015-10-11 NOTE — ED Notes (Signed)
LM on pt's VM [phone]  Need to give lab results from recent visit on 4/18 Also let pt know labs can be obtained from MyChart  Per Dr. Murray,  Please let patient know that throat cx grew a few strep. If still having sore throat/fever, can treat with penicillin V 500mg bid x 10 d, #20, no refills. Recheck if symptoms persist. LM 

## 2015-10-11 NOTE — ED Notes (Signed)
Please let patient know that throat cx grew a few strep.  If still having sore throat/fever, can treat with penicillin V 500mg  bid x 10 d, #20, no refills.  Recheck if symptoms persist.  LM  Eustace MooreLaura W Wilfrid Hyser, MD 10/11/15 [phone]

## 2015-10-14 NOTE — ED Notes (Signed)
LM on pt's VM [phone]  Need to give lab results from recent visit on 4/18 Also let pt know labs can be obtained from MyChart  Per Dr. Dayton ScrapeMurray,  Please let patient know that throat cx grew a few strep. If still having sore throat/fever, can treat with penicillin V 500mg  bid x 10 d, #20, no refills. Recheck if symptoms persist. LM

## 2015-12-04 ENCOUNTER — Encounter (HOSPITAL_COMMUNITY): Payer: Self-pay | Admitting: Emergency Medicine

## 2015-12-04 ENCOUNTER — Ambulatory Visit (HOSPITAL_COMMUNITY)
Admission: EM | Admit: 2015-12-04 | Discharge: 2015-12-04 | Disposition: A | Discharge status: Home / Self Care | Payer: Self-pay | Attending: Emergency Medicine | Admitting: Emergency Medicine

## 2015-12-04 DIAGNOSIS — I10 Essential (primary) hypertension: Secondary | ICD-10-CM | POA: Insufficient documentation

## 2015-12-04 DIAGNOSIS — F419 Anxiety disorder, unspecified: Secondary | ICD-10-CM | POA: Insufficient documentation

## 2015-12-04 DIAGNOSIS — J0110 Acute frontal sinusitis, unspecified: Secondary | ICD-10-CM | POA: Insufficient documentation

## 2015-12-04 DIAGNOSIS — R05 Cough: Secondary | ICD-10-CM | POA: Insufficient documentation

## 2015-12-04 DIAGNOSIS — R059 Cough, unspecified: Secondary | ICD-10-CM

## 2015-12-04 DIAGNOSIS — J069 Acute upper respiratory infection, unspecified: Secondary | ICD-10-CM | POA: Insufficient documentation

## 2015-12-04 DIAGNOSIS — Z809 Family history of malignant neoplasm, unspecified: Secondary | ICD-10-CM | POA: Insufficient documentation

## 2015-12-04 LAB — POCT RAPID STREP A: Streptococcus, Group A Screen (Direct): NEGATIVE

## 2015-12-04 MED ORDER — ALBUTEROL SULFATE HFA 108 (90 BASE) MCG/ACT IN AERS: 1.0000 | INHALATION_SPRAY | Freq: Four times a day (QID) | RESPIRATORY_TRACT | Status: DC | PRN

## 2015-12-04 MED ORDER — DOXYCYCLINE HYCLATE 100 MG PO CAPS: 100.0000 mg | ORAL_CAPSULE | Freq: Two times a day (BID) | ORAL | Status: DC

## 2015-12-04 MED ORDER — GUAIFENESIN-CODEINE 100-10 MG/5ML PO SYRP: 10.0000 mL | ORAL_SOLUTION | Freq: Four times a day (QID) | ORAL | Status: DC | PRN

## 2015-12-04 MED ORDER — AEROCHAMBER PLUS MISC: Status: DC

## 2015-12-04 MED ORDER — IPRATROPIUM BROMIDE 0.06 % NA SOLN: 2.0000 | Freq: Four times a day (QID) | NASAL | Status: DC

## 2015-12-04 NOTE — Discharge Instructions (Signed)
[facility]: [address] [place] [id number]  [phone]  [facility]: [address] [place] [id number]   [phone]) [phone]  [facility]: [address] [place] [id number]  [phone]  [facility] : [address] [place] [id number] [phone]) [phone]  [facility]: [address] [place] [phone]) [phone]  [facility]: [address] [place] [id number] [phone]  Dr. Oneal GroutMahima Pandey [address] [facility] [place] [id number]  [phone]) [phone]  Dr. Jackie PlumGeorge Osei-Bonsu, [facility]. [address] [place] [address]  [phone]) [phone]    Follow-up with one of these physicians as needed. Go to the ER for a change in your chest pain, change in your shortness of breath, difficulty swallowing, breathing. D saline nasal irrigation, ibuprofen 800 mg with 1 g of Tylenol 3 times a day. This is an effective combination for pain and fever. Continue the Mucinex D. Use the Debrox and warm water/hydrogen peroxide rinse for your ear.

## 2015-12-04 NOTE — ED Notes (Signed)
Patient reports onset of symptoms Monday 6/13.  Reports general not feeling well on Monday.  Since then has run a fever of 101, chest soreness, chest congestion, head fullness and pressure. Describes discomfort in chest as burning.  Patient has low energy, and yellow phlegm.

## 2015-12-04 NOTE — ED Provider Notes (Signed)
HPI  SUBJECTIVE:  Eric Keller is a 30 y.o. male who presents with sore throat, fevers to 101.5, chest congestion, coughing, wheezing, malaise, fatigue, shortness of breath with coughing and walking, sinus pain and pressure for the past 2 days. He reports purulent nasal drainage, nasal congestion, rhinorrhea, postnasal drip. Reports burning chest pain when he coughs. There is no other chest pain, no exertional or positional component. He reports bilateral ear pain described as fullness, but no otorrhea or change in his hearing. He has been taking Mucinex and ibuprofen which have helped his symptoms. Last dose of antipyretic within the past 6 hours. There are no aggravating factors. No vomiting, dental pain, abdominal pain, urinary complaints. No drooling, trismus, voice changes. No rash. No known tick bite. No sick contacts. Past medical history negative for asthma, emphysema, COPD, diabetes, hypertension, seasonal allergies, smoking. PMD: none    Past Medical History  Diagnosis Date  . Anxiety   . Depression   . Hypertension   . Anxiety   . H/O: substance abuse     History reviewed. No pertinent past surgical history.  Family History  Problem Relation Age of Onset  . Cancer Mother     Social History  Substance Use Topics  . Smoking status: Never Smoker   . Smokeless tobacco: None  . Alcohol Use: Yes    No current facility-administered medications for this encounter.  Current outpatient prescriptions:  .  albuterol (PROVENTIL HFA;VENTOLIN HFA) 108 (90 Base) MCG/ACT inhaler, Inhale 1-2 puffs into the lungs every 6 (six) hours as needed for wheezing or shortness of breath., Disp: 1 Inhaler, Rfl: 0 .  doxycycline (VIBRAMYCIN) 100 MG capsule, Take 1 capsule (100 mg total) by mouth 2 (two) times daily. X 7 days, Disp: 14 capsule, Rfl: 0 .  guaiFENesin-codeine (CHERATUSSIN AC) 100-10 MG/5ML syrup, Take 10 mLs by mouth 4 (four) times daily as needed for cough., Disp: 120 mL, Rfl: 0 .   ibuprofen (ADVIL,MOTRIN) 800 MG tablet, Take 1 tablet (800 mg total) by mouth 3 (three) times daily., Disp: 30 tablet, Rfl: 0 .  ipratropium (ATROVENT) 0.06 % nasal spray, Place 2 sprays into both nostrils 4 (four) times daily. 3-4 times/ day, Disp: 15 mL, Rfl: 0 .  Spacer/Aero-Holding Chambers (AEROCHAMBER PLUS) inhaler, Use as instructed, Disp: 1 each, Rfl: 2 .  [DISCONTINUED] diphenhydrAMINE (BENADRYL) 25 mg capsule, Take 25 mg by mouth daily as needed for allergies., Disp: , Rfl:   No Known Allergies   ROS  As noted in HPI.   Physical Exam  BP 139/86 mmHg  Pulse 82  Temp(Src) 98.5 F (36.9 C) (Oral)  Resp 16  SpO2 96%  Constitutional: Well developed, well nourished, no acute distress Eyes:  EOMI, conjunctiva normal bilaterally HENT: Normocephalic, atraumatic,mucus membranes moist. TMs normal bilaterally. Positive sinus tenderness, mild nasal congestion. Positive cobblestoning, postnasal drip, erythematous oropharynx with enlarged tonsils, scant exudates, uvula midline. voice normal.  Lymph: No cervical lymphadenopathy Neck: No meningismus.  Respiratory: Normal inspiratory effort good air movement, lungs clear bilaterally no chest wall tenderness Cardiovascular: Normal rate regular rhythm, no murmurs, rubs, gallops GI: nondistended skin: No rash, skin intact Musculoskeletal: no deformities Neurologic: Alert & oriented x 3, no focal neuro deficits Psychiatric: Speech and behavior appropriate   ED Course   Medications - No data to display  Orders Placed This Encounter  Procedures  . POCT rapid strep A [facility])    Standing Status: Standing     Number of Occurrences: 1  Standing Expiration Date:     Results for orders placed or performed during the hospital encounter of 12/04/15 (from the past 24 hour(s))  POCT rapid strep A [facility])     Status: None   Collection Time: 12/04/15  3:28 PM  Result Value Ref Range   Streptococcus, Group A Screen  (Direct) NEGATIVE NEGATIVE   No results found.  ED Clinical Impression  URI (upper respiratory infection)  Acute frontal sinusitis, recurrence not specified  Cough   ED Assessment/Plan  Rapid strep negative.  With sx and sinus tenderness, we'll treat this as a sinus infection. This may be viral, but we'll start doxycycline given the fevers.  Home with doxycycline, ibuprofen, continue Mucinex D, saline nasal irrigation, Atrovent nasal spray.  will also cover a pneumonia although the cough may be from postnasal drip. no Focal lung findings, not tachycardic, satting well on room air deferring x-ray. Home with albuterol with spacer, Cheratussin. Advised ibuprofen 800 mg 3 times a day, patient states he does not need a prescription. Will provide primary care referral.  Discussed labs, MDM, plan and followup with patient . Patient agrees with plan.  *This clinic note was created using Dragon dictation software. Therefore, there may be occasional mistakes despite careful proofreading.  ?   Domenick GongAshley Edrik Rundle, MD 12/05/15 1145

## 2015-12-06 LAB — CULTURE, GROUP A STREP (THRC)

## 2015-12-09 ENCOUNTER — Telehealth (HOSPITAL_COMMUNITY): Payer: Self-pay | Admitting: Emergency Medicine

## 2015-12-09 NOTE — ED Notes (Signed)
LM on pt's VM VM [phone] Need to give lab results from recent visit on 6/15 Also let pt know labs can be obtained from MyChart  Per Dr. Dayton ScrapeMurray,  Notes Recorded by Eustace MooreLaura W Murray, MD on 12/09/2015 at 2:01 PM Please let patient know that throat culture was positive for a few non-group A strep.  If patient is having fever greater than 100.5, and severe sore throat, would treat with penicillin V 500 mg twice a day for 10 days #20 no refills.  Otherwise, no treatment should be needed. Recheck as needed. LM

## 2016-09-07 ENCOUNTER — Ambulatory Visit (HOSPITAL_COMMUNITY)
Admission: EM | Admit: 2016-09-07 | Discharge: 2016-09-07 | Disposition: A | Discharge status: Home / Self Care | Payer: Self-pay | Attending: Family Medicine | Admitting: Family Medicine

## 2016-09-07 ENCOUNTER — Encounter (HOSPITAL_COMMUNITY): Payer: Self-pay | Admitting: Emergency Medicine

## 2016-09-07 DIAGNOSIS — H6123 Impacted cerumen, bilateral: Secondary | ICD-10-CM

## 2016-09-07 DIAGNOSIS — R519 Headache, unspecified: Secondary | ICD-10-CM

## 2016-09-07 DIAGNOSIS — I10 Essential (primary) hypertension: Secondary | ICD-10-CM

## 2016-09-07 DIAGNOSIS — R51 Headache: Secondary | ICD-10-CM

## 2016-09-07 MED ORDER — DICLOFENAC SODIUM 75 MG PO TBEC: 75.0000 mg | DELAYED_RELEASE_TABLET | Freq: Two times a day (BID) | ORAL | 0 refills | Status: DC

## 2016-09-07 NOTE — ED Provider Notes (Signed)
CSN: [id number]     Arrival date & time 09/07/16  1321 History   First MD Initiated Contact with Patient 09/07/16 1410     Chief Complaint  Patient presents with  . Headache   (Consider location/radiation/quality/duration/timing/severity/associated sxs/prior Treatment) 31 year old male presents to clinic for a 2 week history of headache, and evaluation for hypertension.   The history is provided by the patient.  Headache  Pain location:  Generalized Quality:  Dull Radiates to:  Does not radiate Severity currently:  6/10 Severity at highest:  6/10 Onset quality:  Gradual Duration:  2 weeks Timing:  Constant Progression:  Waxing and waning Chronicity:  New Similar to prior headaches: no   Context: bright light and emotional stress   Context: not activity, not caffeine, not coughing, not defecating, not eating, not exposure to cold air, not intercourse, not loud noise and not straining   Relieved by:  Aspirin Worsened by:  Light Ineffective treatments:  Resting in a darkened room Associated symptoms: congestion and photophobia   Associated symptoms: no abdominal pain, no back pain, no cough, no diarrhea, no dizziness, no drainage, no ear pain, no eye pain, no facial pain, no fatigue, no fever, no focal weakness, no hearing loss, no myalgias, no nausea, no neck pain, no neck stiffness, no numbness, no paresthesias, no seizures, no sinus pressure, no sore throat, no swollen glands, no syncope, no tingling, no URI, no visual change and no weakness     Past Medical History:  Diagnosis Date  . Anxiety   . Anxiety   . Depression   . H/O: substance abuse   . Hypertension    History reviewed. No pertinent surgical history. Family History  Problem Relation Age of Onset  . Cancer Mother    Social History  Substance Use Topics  . Smoking status: Never Smoker  . Smokeless tobacco: Not on file  . Alcohol use Yes    Review of Systems  Constitutional: Negative for fatigue and  fever.  HENT: Positive for congestion. Negative for ear pain, hearing loss, postnasal drip, sinus pressure and sore throat.   Eyes: Positive for photophobia. Negative for pain.  Respiratory: Negative for cough, chest tightness, shortness of breath and wheezing.   Cardiovascular: Negative for chest pain, palpitations and syncope.  Gastrointestinal: Negative for abdominal pain, diarrhea and nausea.  Genitourinary: Negative.   Musculoskeletal: Negative for back pain, myalgias, neck pain and neck stiffness.  Neurological: Positive for headaches. Negative for dizziness, focal weakness, seizures, weakness, numbness and paresthesias.  All other systems reviewed and are negative.   Allergies  Patient has no known allergies.  Home Medications   Prior to Admission medications   Medication Sig Start Date End Date Taking? Authorizing Provider  diclofenac (VOLTAREN) 75 MG EC tablet Take 1 tablet (75 mg total) by mouth 2 (two) times daily. 09/07/16   Dorena BodoLawrence Kyli Sorter, NP   Meds Ordered and Administered this Visit  Medications - No data to display  BP (!) 163/81 (BP Location: Right Arm)   Pulse 86   Temp 98.7 F (37.1 C) (Oral)   Resp 16   SpO2 99%  No data found.   Physical Exam  Constitutional: He is oriented to person, place, and time. He appears well-developed and well-nourished. No distress.  HENT:  Head: Normocephalic and atraumatic.  Right Ear: External ear normal.  Left Ear: External ear normal.  Eyes: Conjunctivae and EOM are normal. Pupils are equal, round, and reactive to light. Right eye exhibits no discharge. Left  eye exhibits no discharge.  Neck: Normal range of motion. Neck supple. No JVD present.  Cardiovascular: Normal rate and regular rhythm.   Pulmonary/Chest: Effort normal and breath sounds normal. No respiratory distress. He has no wheezes.  Abdominal: Soft. Bowel sounds are normal. He exhibits no distension. There is no tenderness.  Musculoskeletal: He exhibits no  edema or tenderness.  Lymphadenopathy:    He has no cervical adenopathy.  Neurological: He is alert and oriented to person, place, and time. No cranial nerve deficit or sensory deficit. Coordination normal.  Skin: Skin is warm and dry. Capillary refill takes less than 2 seconds. No rash noted. He is not diaphoretic. No erythema. No pallor.  Psychiatric: He has a normal mood and affect. His behavior is normal.  Nursing note and vitals reviewed.   Urgent Care Course     Procedures (including critical care time)  Labs Review Labs Reviewed - No data to display  Imaging Review No results found.      MDM   1. Acute nonintractable headache, unspecified headache type   2. Essential hypertension   3. Bilateral impacted cerumen    No red flag signs or headache. Advised patient to follow-up with primary care for management of his headache, and for management of his hypertension. Prescription sent for diclofenac, he also had his ears cleaned in clinic today     Dorena Bodo, NP 09/07/16 1502

## 2016-09-07 NOTE — Discharge Instructions (Signed)
For your headache, I have prescribed a medicine called diclofenac, take one tablet twice a day. For your hypertension, I recommend you follow-up with [facility] and schedule an appointment with them. I have attached a hand out on hypertension with your paperwork. We have also cleaned your ears as well. I recommend you follow-up with primary care for further evaluation of your chronic conditions.

## 2016-09-07 NOTE — ED Triage Notes (Signed)
[facility] with a complaint of a headache x 1 week that he attributes to a possibly elevated blood pressure.

## 2017-08-30 ENCOUNTER — Encounter (HOSPITAL_COMMUNITY): Payer: Self-pay | Admitting: Family Medicine

## 2017-08-30 ENCOUNTER — Ambulatory Visit (INDEPENDENT_AMBULATORY_CARE_PROVIDER_SITE_OTHER): Payer: Self-pay

## 2017-08-30 ENCOUNTER — Ambulatory Visit (HOSPITAL_COMMUNITY)
Admission: EM | Admit: 2017-08-30 | Discharge: 2017-08-30 | Disposition: A | Discharge status: Home / Self Care | Payer: Self-pay | Attending: Family Medicine | Admitting: Family Medicine

## 2017-08-30 DIAGNOSIS — J40 Bronchitis, not specified as acute or chronic: Secondary | ICD-10-CM

## 2017-08-30 MED ORDER — IPRATROPIUM BROMIDE 0.06 % NA SOLN: 2.0000 | Freq: Four times a day (QID) | NASAL | 0 refills | Status: DC

## 2017-08-30 MED ORDER — AZITHROMYCIN 250 MG PO TABS: 250.0000 mg | ORAL_TABLET | Freq: Every day | ORAL | 0 refills | Status: DC

## 2017-08-30 MED ORDER — FLUTICASONE PROPIONATE 50 MCG/ACT NA SUSP
2.0000 | Freq: Every day | NASAL | 0 refills | Status: DC
Start: 2017-08-30 — End: 2018-05-11

## 2017-08-30 MED ORDER — ACETAMINOPHEN 325 MG PO TABS: 650.0000 mg | ORAL_TABLET | Freq: Once | ORAL | Status: DC

## 2017-08-30 MED ORDER — BENZONATATE 100 MG PO CAPS: 100.0000 mg | ORAL_CAPSULE | Freq: Three times a day (TID) | ORAL | 0 refills | Status: DC

## 2017-08-30 NOTE — ED Provider Notes (Signed)
[facility]    CSN: [id number] Arrival date & time: 08/30/17  1024     History   Chief Complaint Chief Complaint  Patient presents with  . Cough    HPI Eric Keller is a 32 y.o. male.   32 year old male comes in for 6-day history of URI symptoms.  Has had body aches, chills/sweats, cough, congestion, rhinorrhea, fever.  T-max 101, Tylenol/ibuprofen.  Patient states he slightly short of breath, states hard to take deep breath as it triggers his cough.  Denies wheezing.  OTC cold medication without relief.  Never smoker.  Denies history of asthma.  States has a history of pneumonia, and symptoms feel similar to it.        Past Medical History:  Diagnosis Date  . Anxiety   . Anxiety   . Depression   . H/O: substance abuse   . Hypertension     Patient Active Problem List   Diagnosis Date Noted  . Anxiety 04/14/2011  . Depression 04/14/2011  . Unspecified essential hypertension 04/14/2011    History reviewed. No pertinent surgical history.     Home Medications    Prior to Admission medications   Medication Sig Start Date End Date Taking? Authorizing Provider  azithromycin (ZITHROMAX) 250 MG tablet Take 1 tablet (250 mg total) by mouth daily. Take first 2 tablets together, then 1 every day until finished. Fill on 3/17 if not improving 09/04/17   Cathie HoopsYu, [place] Behlke V, PA-C  benzonatate (TESSALON) 100 MG capsule Take 1 capsule (100 mg total) by mouth every 8 (eight) hours. 08/30/17   Cathie HoopsYu, Warda Mcqueary V, PA-C  fluticasone (FLONASE) 50 MCG/ACT nasal spray Place 2 sprays into both nostrils daily. 08/30/17   Cathie HoopsYu, Abigial Newville V, PA-C  ipratropium (ATROVENT) 0.06 % nasal spray Place 2 sprays into both nostrils 4 (four) times daily. 08/30/17   Belinda FisherYu, Yamilex Borgwardt V, PA-C    Family History Family History  Problem Relation Age of Onset  . Cancer Mother     Social History Social History   Tobacco Use  . Smoking status: Never Smoker  Substance Use Topics  . Alcohol use: Yes  . Drug use: Yes   Types: Marijuana    Comment: Vicodin Abuse     Allergies   Patient has no known allergies.   Review of Systems Review of Systems  Reason unable to perform ROS: See HPI as above.     Physical Exam Triage Vital Signs ED Triage Vitals [08/30/17 1130]  Enc Vitals Group     BP (!) 177/90     Pulse Rate 85     Resp 18     Temp 98.1 F (36.7 C)     Temp src      SpO2 100 %     Weight      Height      Head Circumference      Peak Flow      Pain Score      Pain Loc      Pain Edu?      Excl. in GC?    No data found.  Updated Vital Signs BP (!) 177/90 Comment: took decongestent this am.   Pulse 85   Temp 98.1 F (36.7 C)   Resp 18   SpO2 100%   Physical Exam  Constitutional: He is oriented to person, place, and time. He appears well-developed and well-nourished. No distress.  HENT:  Head: Normocephalic and atraumatic.  Right Ear: Tympanic membrane, external ear and ear  canal normal. Tympanic membrane is not erythematous and not bulging.  Left Ear: Tympanic membrane, external ear and ear canal normal. Tympanic membrane is not erythematous and not bulging.  Nose: Rhinorrhea present. Right sinus exhibits no maxillary sinus tenderness and no frontal sinus tenderness. Left sinus exhibits no maxillary sinus tenderness and no frontal sinus tenderness.  Mouth/Throat: Uvula is midline, oropharynx is clear and moist and mucous membranes are normal.  Eyes: Conjunctivae are normal. Pupils are equal, round, and reactive to light.  Neck: Normal range of motion. Neck supple.  Cardiovascular: Normal rate, regular rhythm and normal heart sounds. Exam reveals no gallop and no friction rub.  No murmur heard. Pulmonary/Chest: Effort normal and breath sounds normal. He has no decreased breath sounds. He has no wheezes. He has no rhonchi. He has no rales.  Lymphadenopathy:    He has no cervical adenopathy.  Neurological: He is alert and oriented to person, place, and time.  Skin: Skin is  warm and dry.  Psychiatric: He has a normal mood and affect. His behavior is normal. Judgment normal.     UC Treatments / Results  Labs (all labs ordered are listed, but only abnormal results are displayed) Labs Reviewed - No data to display  EKG  EKG Interpretation None       Radiology Dg Chest 2 View  Result Date: 08/30/2017 CLINICAL DATA:  Cough and flu-like symptoms for 5 days. Fever. Shortness of breath. Chest tightness. EXAM: CHEST - 2 VIEW COMPARISON:  03/01/2012. FINDINGS: Mediastinum hilar structures are normal. Lungs are clear. Heart size normal. No pleural effusion or pneumothorax. Costophrenic angles are incompletely imaged on frontal view. No acute cardiopulmonary disease. IMPRESSION: No active cardiopulmonary disease. Electronically Signed   By: Maisie Fus  Register   On: 08/30/2017 12:14    Procedures Procedures (including critical care time)  Medications Ordered in UC Medications - No data to display   Initial Impression / Assessment and Plan / UC Course  I have reviewed the triage vital signs and the nursing notes.  Pertinent labs & imaging results that were available during my care of the patient were reviewed by me and considered in my medical decision making (see chart for details).    Chest x-ray negative for pneumonia. Discussed possible till end of flu.  Symptomatic treatment as discussed.  Push fluids.  Rx of azithromycin sent to pharmacy, patient can start in 4 days if symptoms not improving for bronchitis.  Return precautions given.  Patient expresses understanding and agrees to plan.  Final Clinical Impressions(s) / UC Diagnoses   Final diagnoses:  Bronchitis    ED Discharge Orders        Ordered    azithromycin (ZITHROMAX) 250 MG tablet  Daily     08/30/17 1236    benzonatate (TESSALON) 100 MG capsule  Every 8 hours     08/30/17 1236    fluticasone (FLONASE) 50 MCG/ACT nasal spray  Daily     08/30/17 1236    ipratropium (ATROVENT) 0.06 %  nasal spray  4 times daily     08/30/17 1236        Belinda Fisher, PA-C 08/30/17 1256

## 2017-08-30 NOTE — Discharge Instructions (Signed)
Chest xray negative for pneumonia. Tessalon for cough. Start flonase, atrovent nasal spray for nasal congestion/drainage. You can use over the counter nasal saline rinse such as neti pot for nasal congestion. Keep hydrated, your urine should be clear to pale yellow in color. Tylenol/motrin for fever and pain. Monitor for any worsening of symptoms, chest pain, shortness of breath, wheezing, swelling of the throat, follow up for reevaluation.   If symptoms not improving by 3/17, can fill Azithromycin for bronchitis.

## 2017-08-30 NOTE — ED Triage Notes (Addendum)
Pt here for cough, congestion and SOB since Wednesday. Reports weakness, fever and body aches. Taking mucinex, nyquil and ibuprofen.

## 2018-05-11 ENCOUNTER — Telehealth: Payer: Self-pay | Admitting: Medical

## 2018-05-11 ENCOUNTER — Encounter: Payer: Self-pay | Admitting: Medical

## 2018-05-11 ENCOUNTER — Ambulatory Visit (INDEPENDENT_AMBULATORY_CARE_PROVIDER_SITE_OTHER): Payer: Self-pay | Admitting: Medical

## 2018-05-11 VITALS — BP 160/94 | HR 139 | Ht 75.0 in | Wt 212.0 lb

## 2018-05-11 DIAGNOSIS — R519 Headache, unspecified: Secondary | ICD-10-CM | POA: Insufficient documentation

## 2018-05-11 DIAGNOSIS — R51 Headache: Secondary | ICD-10-CM

## 2018-05-11 DIAGNOSIS — I10 Essential (primary) hypertension: Secondary | ICD-10-CM

## 2018-05-11 DIAGNOSIS — R232 Flushing: Secondary | ICD-10-CM | POA: Insufficient documentation

## 2018-05-11 DIAGNOSIS — D751 Secondary polycythemia: Secondary | ICD-10-CM | POA: Insufficient documentation

## 2018-05-11 LAB — COMPREHENSIVE METABOLIC PANEL
A/G RATIO: 2 (ref 1.2–2.2)
ALT: 35 IU/L (ref 0–44)
AST: 36 IU/L (ref 0–40)
Albumin: 4.7 g/dL (ref 3.5–5.5)
Alkaline Phosphatase: 83 IU/L (ref 39–117)
BUN/Creatinine Ratio: 12 (ref 9–20)
BUN: 19 mg/dL (ref 6–20)
Bilirubin Total: 0.3 mg/dL (ref 0.0–1.2)
CO2: 27 mmol/L (ref 20–29)
Calcium: 9.7 mg/dL (ref 8.7–10.2)
Chloride: 97 mmol/L (ref 96–106)
Creatinine, Ser: 1.59 mg/dL — ABNORMAL HIGH (ref 0.76–1.27)
GFR calc Af Amer: 65 mL/min/{1.73_m2} (ref >59)
GFR, EST NON AFRICAN AMERICAN: 56 mL/min/{1.73_m2} — AB (ref >59)
Globulin, Total: 2.3 g/dL (ref 1.5–4.5)
Glucose: 152 mg/dL — ABNORMAL HIGH (ref 65–99)
POTASSIUM: 5.3 mmol/L — AB (ref 3.5–5.2)
Sodium: 138 mmol/L (ref 134–144)
Total Protein: 7 g/dL (ref 6.0–8.5)

## 2018-05-11 MED ORDER — CARVEDILOL 3.125 MG PO TABS: 3.1250 mg | ORAL_TABLET | Freq: Two times a day (BID) | ORAL | 1 refills | Status: DC

## 2018-05-11 NOTE — Progress Notes (Signed)
Subjective:  Eric Keller is a 32 y.o. male who presents for Chief Complaint  Patient presents with  . blood pressure    blood pressure issues for a couple years. used to be on medication. no insurance so he had to stop meds     Here returning for care after being lost to follow-up for a while due to lack of insurance.  He is concerned about palpitations and blood pressure being elevated.  He notes hx/o BP problems even in teenage years.  Was on low dose BP medication as a teen.  For a while as an adult in his mid 420s had normalized BP.   As he has gotten older he has had issues with elevated pulses and BP.   Feels flushed quite a bit, can feel with BP is high, and gets headaches occasionally.   Deals with a lot of anxiety issues.     Uses some caffeine and No Doze at times. Used some cocaine and marijuana in past, maybe 10 years ago.  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  Exercises - daily, eats healthy.  Does weight lifting a lot, conditioning.  Plays basketball, other cardio.   Denies chest pain with exercise, but sometimes gets light headed.  Does get a good amount of salt in diet.  Says 80% of diet is good.     Sometimes checks pulse, and not unusual to see 120s with palpitations.  However, with exercise, pulse can be reasonable even in the 90s with good exercise.   Checks BPs, has wrist cuff, and gets 130/85, sometimes 140 SBP.  But other times high like today's readings.   Sometimes feels light headed with exercise, but no CP, no SOB, no edema.     [facility] few months [facility] , was advised of elevated RBCs.  No family hx/o porphyra or other blood disorders.     Had been on Lisinopril and Carvedilol in the past.     He notes seeing a cardiologist in his teens, had a bunch of tests, scans.   He doesn't recall a specific diagnosis.   Wasn't obese as a teenager.    No other aggravating or relieving factors.    No other c/o.  The following portions of the patient's history  were reviewed and updated as appropriate: allergies, current medications, past family history, past medical history, past social history, past surgical history and problem list.  ROS Otherwise as in subjective above  Past Medical History:  Diagnosis Date  . Anxiety   . Anxiety   . Depression   . H/O: substance abuse (HCC)   . Hypertension    Current Outpatient Medications on File Prior to Visit  Medication Sig Dispense Refill  . MAGNESIUM OXIDE PO Take by mouth.    . Multiple Vitamin (MULTIVITAMIN) tablet Take 1 tablet by mouth daily.     No current facility-administered medications on file prior to visit.       Objective: BP (!) 160/94   Pulse (!) 139   Ht 6\' 3"  (1.905 m)   Wt 212 lb (96.2 kg)   SpO2 98%   BMI 26.50 kg/m   BP Readings from Last 3 Encounters:  05/11/18 (!) 160/94  08/30/17 (!) 177/90  09/07/16 (!) 163/81   Wt Readings from Last 3 Encounters:  05/11/18 212 lb (96.2 kg)  05/06/13 201 lb (91.2 kg)  02/14/12 210 lb (95.3 kg)    General appearance: alert, no distress, well developed, well nourished,  white male, flushed appearing HEENT: normocephalic, sclerae anicteric, conjunctiva pink and moist, TMs pearly, nares patent, no discharge or erythema, pharynx normal Oral cavity: MMM, no lesions Neck: supple, no lymphadenopathy, no thyromegaly, no masses, no bruits Heart: tachycardic, otherwise RRR, normal S1, S2, no murmurs Lungs: CTA bilaterally, no wheezes, rhonchi, or rales Abdomen: +bs, soft, non tender, non distended, no masses, no hepatomegaly, no splenomegaly Pulses: 2+ radial pulses, 2+ pedal pulses, normal cap refill Ext: no edema Neuro: non focal exam    Adult ECG Report  Indication: elevated BP and pulse  Rate: 133 pm  Rhythm: sinus tachycardia  QRS Axis: 66 degrees  PR Interval: 162 ms  QRS Duration: 100 ms  QTc:  Conduction Disturbances: voltage criteria for LVH, T wave abnormality  Other Abnormalities: none  Patient's  cardiac risk factors are: hypertension and male gender.  EKG comparison: 2015  Narrative Interpretation: abnormal, tachycardia, possible ischemia    Assessment: Encounter Diagnoses  Name Primary?  . Essential hypertension, benign Yes  . Flushing   . Nonintractable headache, unspecified chronicity pattern, unspecified headache type   . Erythrocytosis      Plan: Discussed the findings, EKG findings, possible causes.  He has a long history going back to teenage years of high blood pressure with some big evaluation then, but we do not have access to those records.  Counseled on diet, reducing salt, not taking caffeine supplements, cutting back on weight lifting for now, eating healthy and exercising with mild aerobic exercise for the time being.  Labs today.  Begin back on carvedilol which she is taking in the past.  He will monitor blood pressure and pulse and will get me readings within the next 3 to 4 days  Advised either 1 week follow-up here or we may refer to cardiology pending lab results  Counseled on trying to get insurance as he does not have access to insurance to his current employer  [name] was seen today for blood pressure.  Diagnoses and all orders for this visit:  Essential hypertension, benign -     EKG 12-Lead -     Comprehensive metabolic panel -     TSH -     CBC with Differential/Platelet  Flushing  Nonintractable headache, unspecified chronicity pattern, unspecified headache type  Erythrocytosis  Other orders -     carvedilol (COREG) 3.125 MG tablet; Take 1 tablet (3.125 mg total) by mouth 2 (two) times daily with a meal.    Follow up: pending labs

## 2018-05-11 NOTE — Patient Instructions (Signed)
Hypertension (high blood pressure):  Begin Carvedilol, limit salt, avoid heavy weight lifting for now, limit caffiene.  Plan to call in BP and pulse reading Monday, and we will likely need to see you back in 1 week or possibly see cardiology in 1 week.  Specific recommendations:  Exercise regularly, preferably every day with aerobic exercise for 30 minutes or more  Avoid added salt in the diet.   Follow a DASH eating plan or Mediterranean eating plan  Check blood pressures 5 times weekly and record.   Hypertension, commonly called high blood pressure, is when the force of blood pumping through your arteries is too strong. Your arteries are the blood vessels that carry blood from your heart throughout your body. A blood pressure reading consists of a higher number over a lower number, such as 110/72. The higher number (systolic) is the pressure inside your arteries when your heart pumps. The lower number (diastolic) is the pressure inside your arteries when your heart relaxes. Ideally you want your blood pressure below 120/80. Hypertension forces your heart to work harder to pump blood. Your arteries may become narrow or stiff. Having hypertension puts you at risk for heart disease, stroke, and other problems.  RISK FACTORS Some risk factors for high blood pressure are controllable. Others are not.  Risk factors you cannot control include:   Race. You may be at higher risk if you are African American.  Age. Risk increases with age.  Gender. Men are at higher risk than women before age 34 years. After age 34, women are at higher risk than men. Risk factors you can control include:  Not getting enough exercise or physical activity.  Being overweight.  Getting too much fat, sugar, calories, or salt in your diet.  Drinking too much alcohol. SIGNS AND SYMPTOMS Hypertension does not usually cause signs or symptoms. Extremely high blood pressure (hypertensive crisis) may cause headache,  anxiety, shortness of breath, and nosebleed. DIAGNOSIS  To check if you have hypertension, your health care provider will measure your blood pressure while you are seated, with your arm held at the level of your heart. It should be measured at least twice using the same arm. Certain conditions can cause a difference in blood pressure between your right and left arms. A blood pressure reading that is higher than normal on one occasion does not mean that you need treatment. If one blood pressure reading is high, ask your health care provider about having it checked again. BLOOD PRESSURE STAGES Blood pressure is classified into four stages: normal, prehypertension, stage 1, and stage 2. Your blood pressure reading will be used to determine what type of treatment, if any, is necessary. Appropriate treatment options are tied to these four stages:  Normal  Systolic pressure (mm Hg): below 120.  Diastolic pressure (mm Hg): below 80. Prehypertension  Systolic pressure (mm Hg): 120 to 139.  Diastolic pressure (mm Hg): 80 to 89. Stage1  Systolic pressure (mm Hg): 140 to 159.  Diastolic pressure (mm Hg): 90 to 99. Stage2  Systolic pressure (mm Hg): 160 or above.  Diastolic pressure (mm Hg): 100 or above. RISKS RELATED TO HIGH BLOOD PRESSURE Managing your blood pressure is an important responsibility. Uncontrolled high blood pressure can lead to:  A heart attack.  A stroke.  A weakened blood vessel (aneurysm).  Heart failure.  Kidney damage.  Eye damage.  Metabolic syndrome.  Memory and concentration problems. TREATMENT  Treating high blood pressure includes making lifestyle changes and possibly  taking medicine. Living a healthy lifestyle can help lower high blood pressure. You may need to change some of your habits. Lifestyle changes may include:  Following the DASH diet. This diet is high in fruits, vegetables, and whole grains. It is low in salt, red meat, and added  sugars.  Getting at least 2 hours of brisk physical activity every week.  Losing weight if necessary.  Not smoking.  Limiting alcoholic beverages.  Learning ways to reduce stress. If lifestyle changes are not enough to get your blood pressure under control, your health care provider may prescribe medicine. You may need to take more than one. Work closely with your health care provider to understand the risks and benefits. HOME CARE INSTRUCTIONS  Have your blood pressure rechecked as directed by your health care provider.   Take medicines only as directed by your health care provider. Follow the directions carefully. Blood pressure medicines must be taken as prescribed. The medicine does not work as well when you skip doses. Skipping doses also puts you at risk for problems.   Do not smoke.   Monitor your blood pressure at home as directed by your health care provider. SEEK MEDICAL CARE IF:   You think you are having a reaction to medicines taken.  You have recurrent headaches or feel dizzy.  You have swelling in your ankles.  You have trouble with your vision. SEEK IMMEDIATE MEDICAL CARE IF:  You develop a severe headache or confusion.  You have unusual weakness, numbness, or feel faint.  You have severe chest or abdominal pain.  You vomit repeatedly.  You have trouble breathing. MAKE SURE YOU:   Understand these instructions.  Will watch your condition.  Will get help right away if you are not doing well or get worse. Document Released: 06/07/2005 Document Revised: 10/22/2013 Document Reviewed: 03/30/2013 [facility] Patient Information 2015 [place], [place]. This information is not intended to replace advice given to you by your health care provider. Make sure you discuss any questions you have with your health care provider.

## 2018-05-11 NOTE — Telephone Encounter (Signed)
Called [facility] and Carvedilol on $4 list, pt informed

## 2018-05-12 LAB — CBC WITH DIFFERENTIAL/PLATELET
BASOS ABS: 0 10*3/uL (ref 0.0–0.2)
Basos: 1 %
EOS (ABSOLUTE): 0.5 10*3/uL — AB (ref 0.0–0.4)
EOS: 6 %
HEMOGLOBIN: 15.9 g/dL (ref 13.0–17.7)
Hematocrit: 48.4 % (ref 37.5–51.0)
IMMATURE GRANS (ABS): 0 10*3/uL (ref 0.0–0.1)
Immature Granulocytes: 0 %
Lymphocytes Absolute: 2.3 10*3/uL (ref 0.7–3.1)
Lymphs: 28 %
MCH: 29.7 pg (ref 26.6–33.0)
MCHC: 32.9 g/dL (ref 31.5–35.7)
MCV: 91 fL (ref 79–97)
MONOS ABS: 0.6 10*3/uL (ref 0.1–0.9)
Monocytes: 8 %
NEUTROS PCT: 57 %
Neutrophils Absolute: 4.7 10*3/uL (ref 1.4–7.0)
Platelets: 297 10*3/uL (ref 150–450)
RBC: 5.35 x10E6/uL (ref 4.14–5.80)
RDW: 13.5 % (ref 12.3–15.4)
WBC: 8.1 10*3/uL (ref 3.4–10.8)

## 2018-05-12 LAB — TSH: TSH: 2.09 u[IU]/mL (ref 0.450–4.500)

## 2018-05-17 ENCOUNTER — Ambulatory Visit: Payer: Self-pay | Admitting: Medical

## 2018-05-17 ENCOUNTER — Telehealth: Payer: Self-pay | Admitting: Medical

## 2018-05-17 NOTE — Telephone Encounter (Signed)
  Pt was told to call in his bp and pulse  11/22   140/86 pulse 81  11/23 142/89  Pulse  78  11/24 133/90  Pulse 75  11/25  140/91   Pulse 73  11/26 134/85 pulse 70  11/27  131/86 pulse 64  Pt states he took pulse during a real intense exercise and it was 100 then  Wanted to make you aware of that    Please call

## 2018-05-22 NOTE — Telephone Encounter (Signed)
Lets continue the starting dose as it hasn't been a full month yet  Exercise most days per week with walking or other such as bicycle or swimming  One major way to improve BP is to eat more of a vegetarian and low salt diet  F/u as planned

## 2018-05-22 NOTE — Telephone Encounter (Signed)
Patient notified and states that the medication is working but he thinks he may need higher dose.   He schedule f/u appointment for 06-05-18.

## 2018-05-22 NOTE — Telephone Encounter (Signed)
Patient notified

## 2018-05-22 NOTE — Telephone Encounter (Signed)
Readings are improving.  F/u in a few weeks recheck on BP, coreg medication

## 2018-06-05 ENCOUNTER — Ambulatory Visit: Payer: Self-pay | Admitting: Medical

## 2018-06-23 ENCOUNTER — Encounter: Payer: Self-pay | Admitting: Medical

## 2018-06-23 ENCOUNTER — Ambulatory Visit (INDEPENDENT_AMBULATORY_CARE_PROVIDER_SITE_OTHER): Payer: Self-pay | Admitting: Medical

## 2018-06-23 VITALS — BP 170/96 | HR 102 | Temp 98.2°F | Resp 16 | Ht 75.0 in | Wt 212.0 lb

## 2018-06-23 DIAGNOSIS — N289 Disorder of kidney and ureter, unspecified: Secondary | ICD-10-CM

## 2018-06-23 DIAGNOSIS — I10 Essential (primary) hypertension: Secondary | ICD-10-CM

## 2018-06-23 MED ORDER — LISINOPRIL 5 MG PO TABS: 5.0000 mg | ORAL_TABLET | Freq: Every day | ORAL | 11 refills | Status: DC

## 2018-06-23 MED ORDER — CARVEDILOL 6.25 MG PO TABS: 6.2500 mg | ORAL_TABLET | Freq: Two times a day (BID) | ORAL | 11 refills | Status: DC

## 2018-06-23 NOTE — Progress Notes (Signed)
Subjective:  Eric Keller is a 33 y.o. male who presents for Chief Complaint  Patient presents with  . follow up    follow up HTN  running high      Here for BP f/u.  I saw him in late November for returning patient after being lost to follow-up for a while due to lack of insurance.  He started Carvedilol last visit but ran out and didn't go get refill although refills were sent last visit.   Checking home BPs, seeing 130s/80s.  Has seen DBP as high as 90.  Since last visit feels less palpitations, has also cut out caffeine as well.  Still exercising but being a little lighter on exercise until BP medication helps.    At last visit he was concerned about palpitations and blood pressure being elevated.  He notes hx/o BP problems even in teenage years.  Was on low dose BP medication as a teen.  For a while as an adult in his mid 8620s had normalized BP.   As he has gotten older he has had issues with elevated pulses and BP.   He was feeling flushed quite a bit, can feel with BP is high, and gets headaches occasionally.   Deals with a lot of anxiety issues.     Uses some caffeine and No Doze at times. Used some cocaine and marijuana in past, maybe 10 years ago.  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  Exercises - daily, eats healthy.  Does weight lifting a lot, conditioning.  Plays basketball, other cardio.   Denies chest pain with exercise, but sometimes gets light headed.  Does get a good amount of salt in diet.  Says 80% of diet is good.     Had been on Lisinopril and Carvedilol in the past.     He notes seeing a cardiologist in his teens, had a bunch of tests, scans.   He doesn't recall a specific diagnosis.   Wasn't obese as a teenager.    No other aggravating or relieving factors.    No other c/o.  The following portions of the patient's history were reviewed and updated as appropriate: allergies, current medications, past family history, past medical history, past social history, past surgical history  and problem list.  ROS Otherwise as in subjective above  Past Medical History:  Diagnosis Date  . Anxiety   . Anxiety   . Depression   . H/O: substance abuse (HCC)   . Hypertension    Current Outpatient Medications on File Prior to Visit  Medication Sig Dispense Refill  . MAGNESIUM OXIDE PO Take by mouth.    . Multiple Vitamin (MULTIVITAMIN) tablet Take 1 tablet by mouth daily.     No current facility-administered medications on file prior to visit.       Objective: BP (!) 170/96   Pulse (!) 102   Temp 98.2 F (36.8 C) (Oral)   Resp 16   Ht 6\' 3"  (1.905 m)   Wt 212 lb (96.2 kg)   SpO2 96%   BMI 26.50 kg/m   BP Readings from Last 3 Encounters:  06/23/18 (!) 170/96  05/11/18 (!) 160/94  08/30/17 (!) 177/90   Wt Readings from Last 3 Encounters:  06/23/18 212 lb (96.2 kg)  05/11/18 212 lb (96.2 kg)  05/06/13 201 lb (91.2 kg)    General appearance: alert, no distress, well developed, well nourished, white male, flushed appearing Neck: supple, no lymphadenopathy, no thyromegaly, no masses, no bruits Heart: tachycardic,  otherwise RRR, normal S1, S2, no murmurs Lungs: CTA bilaterally, no wheezes, rhonchi, or rales Pulses: 2+ radial pulses, 2+ pedal pulses, normal cap refill Ext: no edema    Assessment: Encounter Diagnoses  Name Primary?  . Essential hypertension, benign Yes  . Impaired renal function      Plan: Increased Carvedilol to 6.25mg  BID, add Lisinopril 5mg , counseled on diet, reducing salt, not taking caffeine supplements, cutting back on weight lifting for now, eating healthy and exercising with mild aerobic exercise for the time being.  Discussed kidney findings likely related to uncontrolled BP over the years.    advised he continue to monitor BPs.  Plan recheck in 4-6 months.     His glucose was elevated last visit but he was non fasting.  Devoe was seen today for follow up.  Diagnoses and all orders for this visit:  Essential  hypertension, benign  Impaired renal function  Other orders -     carvedilol (COREG) 6.25 MG tablet; Take 1 tablet (6.25 mg total) by mouth 2 (two) times daily with a meal. -     lisinopril (PRINIVIL,ZESTRIL) 5 MG tablet; Take 1 tablet (5 mg total) by mouth daily.   Follow up: pending labs

## 2018-06-23 NOTE — Patient Instructions (Signed)
Call either [facility] vascular lab/ultrasound department supervisor, or call [facility], Dr. Jacinto Halim and ask about cash pay price for Echocardiogram/ultrasound of the heart due to long history of high blood pressure and heart enlargement suggestion on EKG.  Let them know you currently do not have insurance.  Increase Coreg/Carvedilol to 6.25mg  twice daily, add Lisinopril 5mg  daily low dose, continue to monitor your blood pressures 3-4 times per week  Limit heavy lifting/weight lifting for another few weeks for the medication to stabilize, but continue cardio exercise

## 2018-08-24 ENCOUNTER — Encounter (HOSPITAL_COMMUNITY): Payer: Self-pay | Admitting: Emergency Medicine

## 2018-08-24 ENCOUNTER — Ambulatory Visit (INDEPENDENT_AMBULATORY_CARE_PROVIDER_SITE_OTHER): Payer: Self-pay

## 2018-08-24 ENCOUNTER — Ambulatory Visit (HOSPITAL_COMMUNITY)
Admission: EM | Admit: 2018-08-24 | Discharge: 2018-08-24 | Disposition: A | Discharge status: Home / Self Care | Payer: Self-pay | Attending: Internal Medicine | Admitting: Internal Medicine

## 2018-08-24 ENCOUNTER — Other Ambulatory Visit: Payer: Self-pay

## 2018-08-24 DIAGNOSIS — J40 Bronchitis, not specified as acute or chronic: Secondary | ICD-10-CM

## 2018-08-24 DIAGNOSIS — R0602 Shortness of breath: Secondary | ICD-10-CM

## 2018-08-24 MED ORDER — BENZONATATE 100 MG PO CAPS: 100.0000 mg | ORAL_CAPSULE | Freq: Three times a day (TID) | ORAL | 0 refills | Status: DC

## 2018-08-24 NOTE — ED Provider Notes (Signed)
[facility]    CSN: [id number] Arrival date & time: 08/24/18  1305     History   Chief Complaint Chief Complaint  Patient presents with  . URI    HPI Eric Keller is a 33 y.o. male complains of evaluation of cough, possible bronchitis. Patient describes symptoms of shortness of breath at rest, pleuritic chest pain, cough, sore throat and sputum production. Symptoms began 3 days ago and are gradually worsening since that time. Patient denies nausea and vomiting. Treatment thus far includes none Past pulmonary history is significant for occasional episodes of bronchitis  HPI  Past Medical History:  Diagnosis Date  . Anxiety   . Anxiety   . Depression   . H/O: substance abuse (HCC)   . Hypertension     Patient Active Problem List   Diagnosis Date Noted  . Essential hypertension, benign 05/11/2018  . Flushing 05/11/2018  . Nonintractable headache 05/11/2018  . Erythrocytosis 05/11/2018  . Anxiety 04/14/2011  . Depression 04/14/2011    History reviewed. No pertinent surgical history.     Home Medications    Prior to Admission medications   Medication Sig Start Date End Date Taking? Authorizing Provider  carvedilol (COREG) 6.25 MG tablet Take 1 tablet (6.25 mg total) by mouth 2 (two) times daily with a meal. 06/23/18  Yes Tysinger, Kermit Balo, PA-C  Cholecalciferol (VITAMIN D3) 10 MCG (400 UNIT) CAPS Take by mouth.   Yes [provider]  lisinopril (PRINIVIL,ZESTRIL) 5 MG tablet Take 1 tablet (5 mg total) by mouth daily. 06/23/18  Yes Tysinger, Kermit Balo, PA-C  MAGNESIUM OXIDE PO Take by mouth.   Yes [provider]  Multiple Vitamin (MULTIVITAMIN) tablet Take 1 tablet by mouth daily.    [provider]    Family History Family History  Problem Relation Age of Onset  . Cancer Mother   . Hypertension Paternal Grandfather   . Heart disease Paternal Grandfather        MIs  . Heart disease Other   . Stroke Other     Social  History Social History   Tobacco Use  . Smoking status: Never Smoker  . Smokeless tobacco: Never Used  Substance Use Topics  . Alcohol use: Yes    Comment: couple drinks a month  . Drug use: Not Currently    Types: Marijuana    Comment: Vicodin Abuse     Allergies   Patient has no known allergies.   Review of Systems Review of Systems  Constitutional: Positive for activity change, chills and fatigue. Negative for appetite change, diaphoresis and fever.  HENT: Positive for ear pain. Negative for ear discharge, hearing loss, postnasal drip, rhinorrhea, sinus pressure and sinus pain.   Eyes: Negative for discharge and itching.  Respiratory: Positive for cough, chest tightness and shortness of breath. Negative for choking, wheezing and stridor.   Gastrointestinal: Negative for abdominal distention, abdominal pain, nausea and vomiting.  Genitourinary: Negative for dysuria and hematuria.  Musculoskeletal: Negative for arthralgias and myalgias.  Skin: Negative for rash.  Neurological: Negative for dizziness, weakness and numbness.  Psychiatric/Behavioral: Negative for agitation and confusion.     Physical Exam Triage Vital Signs ED Triage Vitals  Enc Vitals Group     BP 08/24/18 1326 (!) 164/104     Pulse Rate 08/24/18 1324 73     Resp 08/24/18 1324 16     Temp 08/24/18 1324 97.8 F (36.6 C)     Temp Source 08/24/18 1324 Oral  SpO2 08/24/18 1324 100 %     Weight --      Height --      Head Circumference --      Peak Flow --      Pain Score 08/24/18 1325 3     Pain Loc --      Pain Edu? --      Excl. in GC? --    No data found.  Updated Vital Signs BP (!) 164/104   Pulse 73   Temp 97.8 F (36.6 C) (Oral)   Resp 16   SpO2 100%   Visual Acuity Right Eye Distance:   Left Eye Distance:   Bilateral Distance:    Right Eye Near:   Left Eye Near:    Bilateral Near:     Physical Exam Constitutional:      General: He is in acute distress.     Appearance:  He is ill-appearing.  HENT:     Right Ear: Tympanic membrane normal.     Left Ear: Tympanic membrane normal.     Nose: Congestion present.     Mouth/Throat:     Mouth: Mucous membranes are moist.     Pharynx: No posterior oropharyngeal erythema.  Eyes:     Conjunctiva/sclera: Conjunctivae normal.  Neck:     Musculoskeletal: Normal range of motion. No neck rigidity.  Cardiovascular:     Rate and Rhythm: Normal rate and regular rhythm.     Pulses: Normal pulses.     Heart sounds: Normal heart sounds.  Pulmonary:     Effort: Pulmonary effort is normal. No respiratory distress.     Breath sounds: No wheezing, rhonchi or rales.  Chest:     Chest wall: No tenderness.  Abdominal:     General: Bowel sounds are normal.     Palpations: Abdomen is soft.     Tenderness: There is no abdominal tenderness. There is no guarding.  Musculoskeletal: Normal range of motion.  Lymphadenopathy:     Cervical: No cervical adenopathy.  Skin:    General: Skin is warm.     Capillary Refill: Capillary refill takes less than 2 seconds.     Findings: No rash.  Neurological:     General: No focal deficit present.     Mental Status: He is alert and oriented to person, place, and time.      UC Treatments / Results  Labs (all labs ordered are listed, but only abnormal results are displayed) Labs Reviewed - No data to display  EKG None  Radiology No results found.  Procedures Procedures (including critical care time)  Medications Ordered in UC Medications - No data to display  Initial Impression / Assessment and Plan / UC Course  I have reviewed the triage vital signs and the nursing notes.  Pertinent labs & imaging results that were available during my care of the patient were reviewed by me and considered in my medical decision making (see chart for details).     1.  Acute bronchitis: Tessalon Perles Mucinex Tylenol/Motrin for pain and fever Encourage oral fluid intake  2.   Hypertension, uncontrolled likely secondary to acute illness: Continue current antihypertensive medication regimen Monitor blood pressure  Final Clinical Impressions(s) / UC Diagnoses   Final diagnoses:  None   Discharge Instructions   None    ED Prescriptions    None     Controlled Substance Prescriptions [place] Controlled Substance Registry consulted? No   Merrilee Jansky, MD 08/24/18 1501

## 2018-08-24 NOTE — ED Triage Notes (Signed)
PT reports cough, congestion, productive cough, body aches, fatigue, eyes watering, intermittent fevers, sore throat (resolved) for 2 weeks.

## 2018-09-27 ENCOUNTER — Telehealth: Payer: Self-pay | Admitting: Nurse Practitioner

## 2018-09-27 DIAGNOSIS — R05 Cough: Secondary | ICD-10-CM

## 2018-09-27 DIAGNOSIS — R059 Cough, unspecified: Secondary | ICD-10-CM

## 2018-09-27 NOTE — Progress Notes (Signed)
We are sorry that you are not feeling well.  Here is how we plan to help!  Based on your presentation I believe you most likely have A cough due to a virus.  This is called viral bronchitis and is best treated by rest, plenty of fluids and control of the cough.  You may use Ibuprofen or Tylenol as directed to help your symptoms.     In addition you may use A non-prescription cough medication called Mucinex DM: take 2 tablets every 12 hours.  * I doubt that this is covid. The recommendations are for mild covid symptoms you are to just quarantine at  Home. Since you do not have a fever, I doubt it is covid at this time. We are currently only testing the severely sick people at this time. I hope that wll change in the near future.  From your responses in the eVisit questionnaire you describe inflammation in the upper respiratory tract which is causing a significant cough.  This is commonly called Bronchitis and has four common causes:    Allergies  Viral Infections  Acid Reflux  Bacterial Infection Allergies, viruses and acid reflux are treated by controlling symptoms or eliminating the cause. An example might be a cough caused by taking certain blood pressure medications. You stop the cough by changing the medication. Another example might be a cough caused by acid reflux. Controlling the reflux helps control the cough.  USE OF BRONCHODILATOR ("RESCUE") INHALERS: There is a risk from using your bronchodilator too frequently.  The risk is that over-reliance on a medication which only relaxes the muscles surrounding the breathing tubes can reduce the effectiveness of medications prescribed to reduce swelling and congestion of the tubes themselves.  Although you feel brief relief from the bronchodilator inhaler, your asthma may actually be worsening with the tubes becoming more swollen and filled with mucus.  This can delay other crucial treatments, such as oral steroid medications. If you need to use  a bronchodilator inhaler daily, several times per day, you should discuss this with your provider.  There are probably better treatments that could be used to keep your asthma under control.     HOME CARE . Only take medications as instructed by your medical team. . Complete the entire course of an antibiotic. . Drink plenty of fluids and get plenty of rest. . Avoid close contacts especially the very young and the elderly . Cover your mouth if you cough or cough into your sleeve. . Always remember to wash your hands . A steam or ultrasonic humidifier can help congestion.   GET HELP RIGHT AWAY IF: . You develop worsening fever. . You become short of breath . You cough up blood. . Your symptoms persist after you have completed your treatment plan MAKE SURE YOU   Understand these instructions.  Will watch your condition.  Will get help right away if you are not doing well or get worse.  Your e-visit answers were reviewed by a board certified advanced clinical practitioner to complete your personal care plan.  Depending on the condition, your plan could have included both over the counter or prescription medications. If there is a problem please reply  once you have received a response from your provider. Your safety is important to us.  If you have drug allergies check your prescription carefully.    You can use MyChart to ask questions about today's visit, request a non-urgent call back, or ask for a work or school  excuse for 24 hours related to this e-Visit. If it has been greater than 24 hours you will need to follow up with your provider, or enter a new e-Visit to address those concerns. You will get an e-mail in the next two days asking about your experience.  I hope that your e-visit has been valuable and will speed your recovery. Thank you for using e-visits.  6 minutes spent reviewing and documenting in chart.

## 2018-10-25 ENCOUNTER — Telehealth: Payer: Self-pay | Admitting: Medical

## 2018-10-25 NOTE — Telephone Encounter (Signed)
Dismissal letter in guarantor snapshot  °

## 2018-12-17 ENCOUNTER — Encounter (HOSPITAL_COMMUNITY): Payer: Self-pay | Admitting: Emergency Medicine

## 2018-12-17 ENCOUNTER — Other Ambulatory Visit: Payer: Self-pay

## 2018-12-17 ENCOUNTER — Ambulatory Visit (HOSPITAL_COMMUNITY)
Admission: EM | Admit: 2018-12-17 | Discharge: 2018-12-17 | Disposition: A | Discharge status: Home / Self Care | Payer: Self-pay | Attending: Emergency Medicine | Admitting: Emergency Medicine

## 2018-12-17 DIAGNOSIS — N410 Acute prostatitis: Secondary | ICD-10-CM

## 2018-12-17 DIAGNOSIS — I10 Essential (primary) hypertension: Secondary | ICD-10-CM

## 2018-12-17 DIAGNOSIS — K5900 Constipation, unspecified: Secondary | ICD-10-CM

## 2018-12-17 LAB — POCT URINALYSIS DIP (DEVICE)
Bilirubin Urine: NEGATIVE
Glucose, UA: NEGATIVE mg/dL
Ketones, ur: NEGATIVE mg/dL
Nitrite: NEGATIVE
Protein, ur: NEGATIVE mg/dL
Specific Gravity, Urine: 1.01 (ref 1.005–1.030)
Urobilinogen, UA: 0.2 mg/dL (ref 0.0–1.0)
pH: 6.5 (ref 5.0–8.0)

## 2018-12-17 MED ORDER — CIPROFLOXACIN HCL 500 MG PO TABS: 500.0000 mg | ORAL_TABLET | Freq: Two times a day (BID) | ORAL | 0 refills | Status: AC

## 2018-12-17 MED ORDER — ONDANSETRON 4 MG PO TBDP: 4.0000 mg | ORAL_TABLET | Freq: Three times a day (TID) | ORAL | 0 refills | Status: DC | PRN

## 2018-12-17 NOTE — ED Triage Notes (Signed)
Per pt he has been having sweats and feeling fatigue along with testicle swelling and pain. Pt having problems with urination.

## 2018-12-17 NOTE — Discharge Instructions (Signed)
Your urine and symptoms are concerning for prostatitis.  We will start antibiotics for this as culture of your urine is also pending.  Miralax as needed for constipation.  Tylenol and/or ibuprofen as needed for pain or fevers.  Please follow up with your primary care provider for recheck in the next 2 weeks.  If worsening of symptoms, unable to urinate, uncontrolled fevers, dehydration, or otherwise worsening please go to the ER for further evaluation.

## 2018-12-17 NOTE — ED Provider Notes (Signed)
[facility]    CSN: [id number] Arrival date & time: 12/17/18  1724      History   Chief Complaint Chief Complaint  Patient presents with  . Groin Pain    HPI Frankey Botting is a 33 y.o. male.   Deaken Jurgens presents with complaints of sensation of difficulty with urination, some burning, as well as pelvic pressure and swelling sensation to prostate region. States yesterday he quite suddenly noted pelvic pain with nausea. He vomited once. He felt sweaty and chills, flu-like. He doesn't know if he had a fever during that time. Those symptoms seemed to improve, but still with sensation of difficulty urinating with some burning. Yesterday he did have some RLQ abdominal pain but this has since resolved. He took two ibuprofen before arrival tonight which did seem to help. No back pain. No testicular pain redness or swelling. Denies any previous similar. States it is uncomfortable to sit down, makes him feel like he needs to urinate. He works in Architect. He is not sexually active. No fevers today. States he has been constipated and took a laxative yesterday to pass a BM, which have been strained. Has had decreased appetite and hasn't really eaten today. Hx of anxiety, depression, substance abuse, htn.     ROS per HPI, negative if not otherwise mentioned.      Past Medical History:  Diagnosis Date  . Anxiety   . Anxiety   . Depression   . H/O: substance abuse ([place])   . Hypertension     Patient Active Problem List   Diagnosis Date Noted  . Essential hypertension, benign 05/11/2018  . Flushing 05/11/2018  . Nonintractable headache 05/11/2018  . Erythrocytosis 05/11/2018  . Anxiety 04/14/2011  . Depression 04/14/2011    History reviewed. No pertinent surgical history.     Home Medications    Prior to Admission medications   Medication Sig Start Date End Date Taking? Authorizing Provider  benzonatate (TESSALON) 100 MG capsule Take 1 capsule (100 mg total)  by mouth every 8 (eight) hours. 08/24/18   Chase Picket, MD  carvedilol (COREG) 6.25 MG tablet Take 1 tablet (6.25 mg total) by mouth 2 (two) times daily with a meal. 06/23/18   Tysinger, Camelia Eng, PA-C  Cholecalciferol (VITAMIN D3) 10 MCG (400 UNIT) CAPS Take by mouth.    [provider]  ciprofloxacin (CIPRO) 500 MG tablet Take 1 tablet (500 mg total) by mouth every 12 (twelve) hours for 14 days. 12/17/18 12/31/18  Zigmund Gottron, NP  lisinopril (PRINIVIL,ZESTRIL) 5 MG tablet Take 1 tablet (5 mg total) by mouth daily. 06/23/18   Tysinger, Camelia Eng, PA-C  MAGNESIUM OXIDE PO Take by mouth.    [provider]  Multiple Vitamin (MULTIVITAMIN) tablet Take 1 tablet by mouth daily.    [provider]  ondansetron (ZOFRAN-ODT) 4 MG disintegrating tablet Take 1 tablet (4 mg total) by mouth every 8 (eight) hours as needed for nausea or vomiting. 12/17/18   Zigmund Gottron, NP    Family History Family History  Problem Relation Age of Onset  . Cancer Mother   . Hypertension Paternal Grandfather   . Heart disease Paternal Grandfather        MIs  . Heart disease Other   . Stroke Other     Social History Social History   Tobacco Use  . Smoking status: Never Smoker  . Smokeless tobacco: Never Used  Substance Use Topics  . Alcohol use: Yes  Comment: couple drinks a month  . Drug use: Not Currently    Types: Marijuana    Comment: Vicodin Abuse     Allergies   Patient has no known allergies.   Review of Systems Review of Systems   Physical Exam Triage Vital Signs ED Triage Vitals  Enc Vitals Group     BP 12/17/18 1837 (!) 164/96     Pulse Rate 12/17/18 1837 94     Resp 12/17/18 1837 18     Temp 12/17/18 1837 98.1 F (36.7 C)     Temp Source 12/17/18 1837 Oral     SpO2 12/17/18 1837 99 %     Weight --      Height --      Head Circumference --      Peak Flow --      Pain Score 12/17/18 1826 7     Pain Loc --      Pain Edu? --      Excl. in GC? --     No data found.  Updated Vital Signs BP (!) 164/96 (BP Location: Left Arm)   Pulse 94   Temp 98.1 F (36.7 C) (Oral)   Resp 18   SpO2 99%    Physical Exam Constitutional:      Appearance: He is well-developed.  Cardiovascular:     Rate and Rhythm: Normal rate.  Pulmonary:     Effort: Pulmonary effort is normal.  Abdominal:     Tenderness: There is no abdominal tenderness.     Comments: No pelvic or abdominal pain on palpation; no RLQ pain on heel strike   Genitourinary:    Prostate: Enlarged and tender.     Comments: Mildly tender prostate; noted that patient is uncomfortable to sit on exam table, opting to stand  Skin:    General: Skin is warm and dry.  Neurological:     Mental Status: He is alert and oriented to person, place, and time.      UC Treatments / Results  Labs (all labs ordered are listed, but only abnormal results are displayed) Labs Reviewed  POCT URINALYSIS DIP (DEVICE) - Abnormal; Notable for the following components:      Result Value   Hgb urine dipstick MODERATE (*)    Leukocytes,Ua TRACE (*)    All other components within normal limits  URINE CULTURE  URINE CYTOLOGY ANCILLARY ONLY    EKG None  Radiology No results found.  Procedures Procedures (including critical care time)  Medications Ordered in UC Medications - No data to display  Initial Impression / Assessment and Plan / UC Course  I have reviewed the triage vital signs and the nursing notes.  Pertinent labs & imaging results that were available during my care of the patient were reviewed by me and considered in my medical decision making (see chart for details).     Constipation vs kidney stone vs appendicitis vs UTI vs prostatitis considered and discussed. Opted to provide treatment for prostatitis as also with leuks and hgb in UA today. Culture pending. Urine cytology also collected and pending. Encouraged constipation treatment. Strict er precautions discussed. Encouraged  close follow up with PCP. Patient verbalized understanding and agreeable to plan.   Final Clinical Impressions(s) / UC Diagnoses   Final diagnoses:  Acute prostatitis     Discharge Instructions     Your urine and symptoms are concerning for prostatitis.  We will start antibiotics for this as culture of your urine is also  pending.  Miralax as needed for constipation.  Tylenol and/or ibuprofen as needed for pain or fevers.  Please follow up with your primary care provider for recheck in the next 2 weeks.  If worsening of symptoms, unable to urinate, uncontrolled fevers, dehydration, or otherwise worsening please go to the ER for further evaluation.    ED Prescriptions    Medication Sig Dispense Auth. Provider   ciprofloxacin (CIPRO) 500 MG tablet Take 1 tablet (500 mg total) by mouth every 12 (twelve) hours for 14 days. 28 tablet Linsie Lupo B, NP   ondansetron (ZOFRAN-ODT) 4 MG disintegrating tablet Take 1 tablet (4 mg total) by mouth every 8 (eight) hours as needed for nausea or vomiting. 12 tablet Georgetta HaberBurky, Loetta Connelley B, NP     Controlled Substance Prescriptions [place] Controlled Substance Registry consulted? Not Applicable   Georgetta HaberBurky, Anicka Stuckert B, NP 12/17/18 2220

## 2018-12-18 LAB — URINE CULTURE: Culture: <10000 — AB

## 2018-12-19 LAB — URINE CYTOLOGY ANCILLARY ONLY
Chlamydia: NEGATIVE
Neisseria Gonorrhea: NEGATIVE
Trichomonas: NEGATIVE

## 2019-02-12 ENCOUNTER — Telehealth: Payer: Self-pay | Admitting: General Practice

## 2019-02-12 ENCOUNTER — Other Ambulatory Visit: Payer: Self-pay | Admitting: Medical

## 2019-02-12 MED ORDER — CARVEDILOL 6.25 MG PO TABS: 6.2500 mg | ORAL_TABLET | Freq: Two times a day (BID) | ORAL | 0 refills | Status: DC

## 2019-02-12 NOTE — Telephone Encounter (Signed)
Pt called he has been dismissed in May due to having a balance. Pt said he has been have some financial issues and is only able to pay half of the balance and half later. He is out of Carvedilol and states he desperately needs this medication. He wants to know if you could call him in maybe a weeks worth of the medication. Until he is able to pay his full balance. He still uses the [facility] on  [address]. He can be reached at [phone]

## 2019-02-12 NOTE — Telephone Encounter (Signed)
I sent a refill, but per chart, he wasn't out of refills.  Audelia Acton

## 2019-02-20 ENCOUNTER — Telehealth: Payer: Self-pay | Admitting: Family Medicine

## 2019-02-20 NOTE — Telephone Encounter (Signed)
Pt talked me about needing his refill and calling our office.  Pt had been dismissed.

## 2019-03-29 ENCOUNTER — Encounter (HOSPITAL_COMMUNITY): Payer: Self-pay | Admitting: Emergency Medicine

## 2019-03-29 ENCOUNTER — Ambulatory Visit (HOSPITAL_COMMUNITY)
Admission: EM | Admit: 2019-03-29 | Discharge: 2019-03-29 | Disposition: A | Discharge status: Home / Self Care | Payer: Self-pay | Attending: Family Medicine | Admitting: Family Medicine

## 2019-03-29 ENCOUNTER — Other Ambulatory Visit: Payer: Self-pay

## 2019-03-29 DIAGNOSIS — Z79899 Other long term (current) drug therapy: Secondary | ICD-10-CM

## 2019-03-29 DIAGNOSIS — Z760 Encounter for issue of repeat prescription: Secondary | ICD-10-CM

## 2019-03-29 DIAGNOSIS — R000 Tachycardia, unspecified: Secondary | ICD-10-CM

## 2019-03-29 DIAGNOSIS — I10 Essential (primary) hypertension: Secondary | ICD-10-CM

## 2019-03-29 MED ORDER — LISINOPRIL 5 MG PO TABS: 5.0000 mg | ORAL_TABLET | Freq: Every day | ORAL | 2 refills | Status: DC

## 2019-03-29 MED ORDER — CARVEDILOL 12.5 MG PO TABS: 12.5000 mg | ORAL_TABLET | Freq: Two times a day (BID) | ORAL | 2 refills | Status: DC

## 2019-03-29 NOTE — ED Provider Notes (Signed)
[facility]    CSN: [id number] Arrival date & time: 03/29/19  1352      History   Chief Complaint Chief Complaint  Patient presents with  . Medication Refill    HPI Eric Keller is a 33 y.o. male.   HPI  Patient states that he is on lisinopril and Coreg for his blood pressure.  He states that these were by his primary care doctor.  He states his primary care doctor told him to double his Coreg.  He ran out a week ago.  He has been feeling rapid heartbeat and anxiety.  No headache, dizziness, or neuro symptoms.  His blood pressure is elevated.  He is unable to go back to his prior doctor because he owes them money.  He is here for blood pressure medicine refill and recommendations regarding primary care.  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  No chest pain, shortness of breath, pedal edema  Past Medical History:  Diagnosis Date  . Anxiety   . Anxiety   . Depression   . H/O: substance abuse ([facility])   . Hypertension     Patient Active Problem List   Diagnosis Date Noted  . Essential hypertension, benign 05/11/2018  . Flushing 05/11/2018  . Nonintractable headache 05/11/2018  . Erythrocytosis 05/11/2018  . Anxiety 04/14/2011  . Depression 04/14/2011    History reviewed. No pertinent surgical history.     Home Medications    Prior to Admission medications   Medication Sig Start Date End Date Taking? Authorizing Provider  carvedilol (COREG) 12.5 MG tablet Take 1 tablet (12.5 mg total) by mouth 2 (two) times daily with a meal. 03/29/19   Eustace Moore, MD  Cholecalciferol (VITAMIN D3) 10 MCG (400 UNIT) CAPS Take by mouth.    [provider]  lisinopril (PRINIVIL,ZESTRIL) 5 MG tablet Take 1 tablet (5 mg total) by mouth daily. 06/23/18   Tysinger, Kermit Balo, PA-C  lisinopril (ZESTRIL) 5 MG tablet Take 1 tablet (5 mg total) by mouth daily. 03/29/19   Eustace Moore, MD  MAGNESIUM OXIDE PO Take by mouth.     [provider]  Multiple Vitamin (MULTIVITAMIN) tablet Take 1 tablet by mouth daily.    [provider]    Family History Family History  Problem Relation Age of Onset  . Cancer Mother   . Hypertension Paternal Grandfather   . Heart disease Paternal Grandfather        MIs  . Heart disease Other   . Stroke Other     Social History Social History   Tobacco Use  . Smoking status: Never Smoker  . Smokeless tobacco: Never Used  Substance Use Topics  . Alcohol use: Yes    Comment: couple drinks a month  . Drug use: Not Currently    Types: Marijuana    Comment: Vicodin Abuse     Allergies   Patient has no known allergies.   Review of Systems Review of Systems  Constitutional: Negative for chills and fever.  HENT: Negative for ear pain and sore throat.   Eyes: Negative for pain and visual disturbance.  Respiratory: Negative for cough and shortness of breath.   Cardiovascular: Positive for palpitations. Negative for chest pain.  Gastrointestinal: Negative for abdominal pain and vomiting.  Genitourinary: Negative for dysuria and hematuria.  Musculoskeletal: Negative for arthralgias and back pain.  Skin: Negative for color change and rash.  Neurological: Negative for seizures and syncope.  Psychiatric/Behavioral: The patient is nervous/anxious.   All other systems reviewed and are negative.    Physical Exam Triage Vital Signs ED Triage Vitals  Enc Vitals Group     BP 03/29/19 1407 (!) 183/105     Pulse Rate 03/29/19 1407 99     Resp 03/29/19 1407 18     Temp 03/29/19 1407 98.4 F (36.9 C)     Temp Source 03/29/19 1407 Oral     SpO2 03/29/19 1407 98 %     Weight --      Height --      Head Circumference --      Peak Flow --      Pain Score 03/29/19 1408 0     Pain Loc --      Pain Edu? --      Excl. in GC? --    No data found.  Updated Vital Signs BP (!) 183/105 (BP Location: Right Arm)   Pulse 99   Temp 98.4 F (36.9 C) (Oral)    Resp 18   SpO2 98%      Physical Exam Constitutional:      General: He is not in acute distress.    Appearance: He is well-developed and normal weight.  HENT:     Head: Normocephalic and atraumatic.  Eyes:     Conjunctiva/sclera: Conjunctivae normal.     Pupils: Pupils are equal, round, and reactive to light.     Comments: Disks are flat  Neck:     Musculoskeletal: Normal range of motion and neck supple.  Cardiovascular:     Rate and Rhythm: Regular rhythm. Tachycardia present.     Heart sounds: Normal heart sounds.  Pulmonary:     Effort: Pulmonary effort is normal. No respiratory distress.     Breath sounds: Normal breath sounds. No wheezing or rales.  Abdominal:     General: There is no distension.     Palpations: Abdomen is soft.  Musculoskeletal: Normal range of motion.     Right lower leg: No edema.     Left lower leg: No edema.  Skin:    General: Skin is warm and dry.  Neurological:     Mental Status: He is alert.     Deep Tendon Reflexes: Reflexes normal.  Psychiatric:        Mood and Affect: Mood normal.        Behavior: Behavior normal.      UC Treatments / Results  Labs (all labs ordered are listed, but only abnormal results are displayed) Labs Reviewed - No data to display  EKG   Radiology No results found.  Procedures Procedures (including critical care time)  Medications Ordered in UC Medications - No data to display  Initial Impression / Assessment and Plan / UC Course  I have reviewed the triage vital signs and the nursing notes.  Pertinent labs & imaging results that were available during my care of the patient were reviewed by me and considered in my medical decision making (see chart for details).     Discussed importance of finding a new PCP and staying on medicine regularly without labs.  Discussed untreated hypertension.  Discussed need for regular laboratory testing while on these medications. Final Clinical Impressions(s) / UC  Diagnoses   Final diagnoses:  Essential hypertension  Medication management     Discharge Instructions     Take your blood pressure medications as prescribed Follow-up with a new primary care doctor.  You will  need to be seen once or twice a year.   ED Prescriptions    Medication Sig Dispense Auth. Provider   carvedilol (COREG) 12.5 MG tablet Take 1 tablet (12.5 mg total) by mouth 2 (two) times daily with a meal. 60 tablet Raylene Everts, MD   lisinopril (ZESTRIL) 5 MG tablet Take 1 tablet (5 mg total) by mouth daily. 30 tablet Raylene Everts, MD     PDMP not reviewed this encounter.   Raylene Everts, MD 03/29/19 [id number]

## 2019-03-29 NOTE — ED Triage Notes (Signed)
Pt here for medication refill on lisinopril and coreg

## 2019-03-29 NOTE — Discharge Instructions (Signed)
Take your blood pressure medications as prescribed Follow-up with a new primary care doctor.  You will need to be seen once or twice a year.

## 2019-05-28 ENCOUNTER — Emergency Department: Payer: Self-pay

## 2019-05-28 ENCOUNTER — Emergency Department
Admission: EM | Admit: 2019-05-28 | Discharge: 2019-05-28 | Disposition: A | Discharge status: Home / Self Care | Payer: Self-pay | Attending: Emergency Medicine | Admitting: Emergency Medicine

## 2019-05-28 ENCOUNTER — Other Ambulatory Visit: Payer: Self-pay

## 2019-05-28 ENCOUNTER — Encounter: Payer: Self-pay | Admitting: Emergency Medicine

## 2019-05-28 DIAGNOSIS — I10 Essential (primary) hypertension: Secondary | ICD-10-CM | POA: Insufficient documentation

## 2019-05-28 DIAGNOSIS — R1115 Cyclical vomiting syndrome unrelated to migraine: Secondary | ICD-10-CM | POA: Insufficient documentation

## 2019-05-28 DIAGNOSIS — Z79899 Other long term (current) drug therapy: Secondary | ICD-10-CM | POA: Insufficient documentation

## 2019-05-28 LAB — CBC WITH DIFFERENTIAL/PLATELET
Abs Immature Granulocytes: 0.02 10*3/uL (ref 0.00–0.07)
Basophils Absolute: 0 10*3/uL (ref 0.0–0.1)
Basophils Relative: 0 %
Eosinophils Absolute: 0.1 10*3/uL (ref 0.0–0.5)
Eosinophils Relative: 1 %
HCT: 47 % (ref 39.0–52.0)
Hemoglobin: 16.7 g/dL (ref 13.0–17.0)
Immature Granulocytes: 0 %
Lymphocytes Relative: 28 %
Lymphs Abs: 2.3 10*3/uL (ref 0.7–4.0)
MCH: 32.3 pg (ref 26.0–34.0)
MCHC: 35.5 g/dL (ref 30.0–36.0)
MCV: 90.9 fL (ref 80.0–100.0)
Monocytes Absolute: 0.6 10*3/uL (ref 0.1–1.0)
Monocytes Relative: 7 %
Neutro Abs: 5.2 10*3/uL (ref 1.7–7.7)
Neutrophils Relative %: 64 %
Platelets: 196 10*3/uL (ref 150–400)
RBC: 5.17 MIL/uL (ref 4.22–5.81)
RDW: 12.4 % (ref 11.5–15.5)
WBC: 8.2 10*3/uL (ref 4.0–10.5)
nRBC: 0 % (ref 0.0–0.2)

## 2019-05-28 LAB — COMPREHENSIVE METABOLIC PANEL
ALT: 79 U/L — ABNORMAL HIGH (ref 0–44)
AST: 84 U/L — ABNORMAL HIGH (ref 15–41)
Albumin: 4.2 g/dL (ref 3.5–5.0)
Alkaline Phosphatase: 63 U/L (ref 38–126)
Anion gap: 11 (ref 5–15)
BUN: 15 mg/dL (ref 6–20)
CO2: 25 mmol/L (ref 22–32)
Calcium: 9.3 mg/dL (ref 8.9–10.3)
Chloride: 100 mmol/L (ref 98–111)
Creatinine, Ser: 0.94 mg/dL (ref 0.61–1.24)
GFR calc Af Amer: >60 mL/min (ref >60)
GFR calc non Af Amer: >60 mL/min (ref >60)
Glucose, Bld: 134 mg/dL — ABNORMAL HIGH (ref 70–99)
Potassium: 4 mmol/L (ref 3.5–5.1)
Sodium: 136 mmol/L (ref 135–145)
Total Bilirubin: 1.5 mg/dL — ABNORMAL HIGH (ref 0.3–1.2)
Total Protein: 7.1 g/dL (ref 6.5–8.1)

## 2019-05-28 LAB — URINALYSIS, ROUTINE W REFLEX MICROSCOPIC
Bilirubin Urine: NEGATIVE
Glucose, UA: NEGATIVE mg/dL
Hgb urine dipstick: NEGATIVE
Ketones, ur: NEGATIVE mg/dL
Leukocytes,Ua: NEGATIVE
Nitrite: NEGATIVE
Protein, ur: NEGATIVE mg/dL
Specific Gravity, Urine: 1.009 (ref 1.005–1.030)
pH: 6 (ref 5.0–8.0)

## 2019-05-28 LAB — MAGNESIUM: Magnesium: 2 mg/dL (ref 1.7–2.4)

## 2019-05-28 LAB — LIPASE, BLOOD: Lipase: 38 U/L (ref 11–51)

## 2019-05-28 LAB — TROPONIN I (HIGH SENSITIVITY): Troponin I (High Sensitivity): 8 ng/L (ref <18)

## 2019-05-28 MED ORDER — ONDANSETRON HCL 4 MG PO TABS: 4.0000 mg | ORAL_TABLET | Freq: Every day | ORAL | 0 refills | Status: DC | PRN

## 2019-05-28 MED ORDER — ONDANSETRON HCL 4 MG/2ML IJ SOLN
4.0000 mg | Freq: Once | INTRAMUSCULAR | Status: AC
  Administered 2019-05-28: 4 mg via INTRAVENOUS
  Filled 2019-05-28: qty 2

## 2019-05-28 MED ORDER — IOHEXOL 300 MG/ML  SOLN
100.0000 mL | Freq: Once | INTRAMUSCULAR | Status: AC | PRN
  Administered 2019-05-28: 14:00:00 100 mL via INTRAVENOUS
  Filled 2019-05-28: qty 100

## 2019-05-28 MED ORDER — SODIUM CHLORIDE 0.9 % IV BOLUS
1000.0000 mL | Freq: Once | INTRAVENOUS | Status: AC
  Administered 2019-05-28: 1000 mL via INTRAVENOUS

## 2019-05-28 MED ORDER — HALOPERIDOL LACTATE 5 MG/ML IJ SOLN
2.0000 mg | Freq: Once | INTRAMUSCULAR | Status: AC
  Administered 2019-05-28: 13:00:00 2 mg via INTRAVENOUS
  Filled 2019-05-28: qty 1

## 2019-05-28 MED ORDER — LISINOPRIL 5 MG PO TABS: 5.0000 mg | ORAL_TABLET | Freq: Every day | ORAL | 0 refills | Status: DC

## 2019-05-28 NOTE — Discharge Instructions (Addendum)
This is most likely related to your marijuana use.  You can take Zofran as needed to help with the nausea.  Stop using marijuana.  Return to the ER though if you develop worsening pain in the right upper quadrant.  There are some evidence of some inflammation of your liver and your liver function was slightly elevated.  You should follow-up for recheck in 1 to 2 weeks with your primary care doctor.  Follow up for BP recheck too.  CT scan:   No evidence of acute intra-abdominal pathology.   Mild asymmetric prominence of the right renal pelvis and ureter without overt hydroureteronephrosis. No urinary tract calculus is identified.   Hepatic steatosis.

## 2019-05-28 NOTE — ED Provider Notes (Signed)
[facility] Emergency Department Provider Note  ____________________________________________   First MD Initiated Contact with Patient 05/28/19 1208     (approximate)  I have reviewed the triage vital signs and the nursing notes.   HISTORY  Chief Complaint Emesis    HPI Eric Keller is a 33 y.o. male with anxiety, depression, hypertension who presents with emesis.  Patient had emesis for about 3 weeks.  He has been getting worse over the past 10 days where it is severe, intermittent mostly at nighttime, associated with episodes nonbloody he describes as bile looking, has not taking any relieving  factors.  He states is been associate with some right lower quadrant pain has been getting worse as well. He was worried about his appendix.  On review of systems he does endorse little bit of chest pressure but no severe chest pain.  He does use THC but says he does not think is related to that since some days he will use it be fine and some days he only use it and then have the issues. He uses it about 4x weekly.  Denies any known coronavirus contacts.          Past Medical History:  Diagnosis Date   Anxiety    Anxiety    Depression    H/O: substance abuse (HCC)    Hypertension     Patient Active Problem List   Diagnosis Date Noted   Essential hypertension, benign 05/11/2018   Flushing 05/11/2018   Nonintractable headache 05/11/2018   Erythrocytosis 05/11/2018   Anxiety 04/14/2011   Depression 04/14/2011    No past surgical history on file.  Prior to Admission medications   Medication Sig Start Date End Date Taking? Authorizing Provider  carvedilol (COREG) 12.5 MG tablet Take 1 tablet (12.5 mg total) by mouth 2 (two) times daily with a meal. 03/29/19   Eustace MooreNelson, Yvonne Sue, MD  Cholecalciferol (VITAMIN D3) 10 MCG (400 UNIT) CAPS Take by mouth.    [provider]  lisinopril (PRINIVIL,ZESTRIL) 5 MG tablet Take 1 tablet (5 mg  total) by mouth daily. 06/23/18   Tysinger, Kermit Baloavid S, PA-C  lisinopril (ZESTRIL) 5 MG tablet Take 1 tablet (5 mg total) by mouth daily. 03/29/19   Eustace MooreNelson, Yvonne Sue, MD  MAGNESIUM OXIDE PO Take by mouth.    [provider]  Multiple Vitamin (MULTIVITAMIN) tablet Take 1 tablet by mouth daily.    [provider]    Allergies Patient has no known allergies.  Family History  Problem Relation Age of Onset   Cancer Mother    Hypertension Paternal Grandfather    Heart disease Paternal Grandfather        MIs   Heart disease Other    Stroke Other     Social History Social History   Tobacco Use   Smoking status: Never Smoker   Smokeless tobacco: Never Used  Substance Use Topics   Alcohol use: Yes    Comment: couple drinks a month   Drug use: Not Currently    Types: Marijuana    Comment: Vicodin Abuse      Review of Systems Constitutional: No fever/chills Eyes: No visual changes. ENT: No sore throat. Cardiovascular: Small amount of chest pressure Respiratory: Denies shortness of breath. Gastrointestinal: Positive abdominal pain, nausea, vomiting Genitourinary: Negative for dysuria. Musculoskeletal: Negative for back pain. Skin: Negative for rash. Neurological: Negative for headaches, focal weakness or numbness. All other ROS negative ____________________________________________   PHYSICAL EXAM:  VITAL SIGNS:  Blood pressure (!) 175/124, pulse 72, temperature 98.1 F (36.7 C), temperature source Oral, resp. rate 18, height  (1.905 m), weight 99.8 kg, SpO2 98 %.   Constitutional: Alert and oriented. Well appearing and in no acute distress. Eyes: Conjunctivae are normal. EOMI. Head: Atraumatic. Nose: No congestion/rhinnorhea. Mouth/Throat: Mucous membranes are moist.   Neck: No stridor. Trachea Midline. FROM Cardiovascular: Normal rate, regular rhythm. Grossly normal heart sounds.  Good peripheral circulation. Respiratory: Normal  respiratory effort.  No retractions. Lungs CTAB. Gastrointestinal: tenderness in the RLQ.  No distention. No abdominal bruits.  Musculoskeletal: No lower extremity tenderness nor edema.  No joint effusions. Neurologic:  Normal speech and language. No gross focal neurologic deficits are appreciated.  Skin:  Skin is warm, dry and intact. No rash noted. Psychiatric: Mood and affect are normal. Speech and behavior are normal. GU: Deferred   ____________________________________________   LABS (all labs ordered are listed, but only abnormal results are displayed)  Labs Reviewed  COMPREHENSIVE METABOLIC PANEL - Abnormal; Notable for the following components:      Result Value   Glucose, Bld 134 (*)    AST 84 (*)    ALT 79 (*)    Total Bilirubin 1.5 (*)    All other components within normal limits  URINALYSIS, ROUTINE W REFLEX MICROSCOPIC - Abnormal; Notable for the following components:   Color, Urine YELLOW (*)    APPearance CLEAR (*)    All other components within normal limits  CBC WITH DIFFERENTIAL/PLATELET  LIPASE, BLOOD  MAGNESIUM  TROPONIN I (HIGH SENSITIVITY)  TROPONIN I (HIGH SENSITIVITY)   ____________________________________________   ED ECG REPORT I, Concha Se, the attending physician, personally viewed and interpreted this ECG.  EKG is normal sinus rate of 60, no ST elevation, T wave inversion in aVL, normal intervals.  Does have some Q waves in 2-3 and aVF similar to his prior EKGs. ____________________________________________  RADIOLOGY   Official radiology report(s): Ct Abdomen Pelvis W Contrast  Result Date: 05/28/2019 CLINICAL DATA:  Abdominal pain, acute, generalized, right lower quadrant pain. Nausea/vomiting for 3 weeks with some night sweats. EXAM: CT ABDOMEN AND PELVIS WITH CONTRAST TECHNIQUE: Multidetector CT imaging of the abdomen and pelvis was performed using the standard protocol following bolus administration of intravenous contrast. CONTRAST:   OMNIPAQUE IOHEXOL 300 MG/ML  SOLN COMPARISON:  CT abdomen/pelvis 05/06/2013. FINDINGS: LOWER CHEST: The imaged lungs are clear.The visualized heart is normal in size. HEPATOBILIARY: No focal liver lesion. Hepatic steatosis. No biliary ductal dilatation. PANCREAS: No focal lesion.No ductal dilatation or peripancreatic edema. SPLEEN: No focal lesion.No splenomegaly. ADRENALS/URINARY TRACT: No adrenal gland mass. Symmetric renal enhancement.No renal mass. Mild asymmetric prominence of the right renal pelvis and right ureter without overt hydroureteronephrosis. No renal, ureteral or bladder calculi are identified. The bladder is unremarkable for the degree of distension. STOMACH/BOWEL: The stomach is unremarkable.No dilated loops of bowel or bowel wall thickening.No evidence of acute appendicitis. VASCULAR/LYMPHATIC: No abdominal aortic aneurysm. The IVC, portal vein, SMV and splenic vein are patent. No lymphadenopathy. REPRODUCTIVE: Incompletely assessed by CT modality.No pathologic findings. OTHER: No ascites.The body wall is normal. MUSCULOSKELETAL: No acute bony abnormality. IMPRESSION: No evidence of acute intra-abdominal pathology. Mild asymmetric prominence of the right renal pelvis and ureter without overt hydroureteronephrosis. No urinary tract calculus is identified. Hepatic steatosis. Electronically Signed   By: Jackey Loge DO   On: 05/28/2019 13:55    ____________________________________________   PROCEDURES  Procedure(s) performed (including Critical Care):  Procedures  ____________________________________________   INITIAL IMPRESSION / ASSESSMENT AND PLAN / ED COURSE  Eric Keller was evaluated in Emergency Department on 05/28/2019 for the symptoms described in the history of present illness. He was evaluated in the context of the global COVID-19 pandemic, which necessitated consideration that the patient might be at risk for infection with the SARS-CoV-2 virus that causes  COVID-19. Institutional protocols and algorithms that pertain to the evaluation of patients at risk for COVID-19 are in a state of rapid change based on information released by regulatory bodies including the CDC and federal and state organizations. These policies and algorithms were followed during the patient's care in the ED.    Patient is a 33 year old who presents with nausea and vomiting quadrant pain.  Will get CT scan to evaluate for appendicitis, SBO, diverticulitis.  Will get labs to evaluate for electrolyte abnormalities, AKI.  Will get troponin and EKG to evaluate for ACS. No crepitus and well appearing do not suspect boerhaave. Most likely related to [facility] if workup negative.    1:08 PM patient still feeling nauseous after the Zofran.  We will try some Haldol given my concern this could be related to [facility] cyclic vomiting.  2:12 PM patient feeling much better after the Haldol.  CT imaging was reassuring.  Mild asymmetric prominence of the right renal pelvis and ureter but patient has no evidence of kidney stone and no UTI to suggest pyelonephritis.  I discussed these results with patient.  Troponin was negative and I have very low suspicion for ACS.  LFTs were slightly elevated with an AST of 84 and ALT of 79 and total bili 1.5.  I reevaluated patient he does not have any right upper quadrant tenderness.  CT scan did not mention the gallbladder but will discuss just ensure that there is no gallbladder wall thickening.  I offered an ultrasound to evaluate for cholecystitis but patient is insistent that he is not have any pain there and he would like to hold off at this time.  I did discuss with Dr. Cleatrice Burke from radiology. Gallbladder was under distended and CBD was normal with no hepatic dilation. Does have the hepatic steatosis which could contribute for some of his LFT elevation.  Also clinically patient is not tender in his right upper quadrants he is not presenting like an acute cholecystitis.   Patient will follow up with these lab abnormalities and hepatic steatosis with his PCP and return to the ER for worsening symptoms or any other concerns. He also needs BP check in 1 week. Did not take meds this morning so most likely cause of elevation.   Pt able to take some PO.   I discussed the provisional nature of ED diagnosis, the treatment so far, the ongoing plan of care, follow up appointments and return precautions with the patient and any family or support people present. They expressed understanding and agreed with the plan, discharged home.   ____________________________________________   FINAL CLINICAL IMPRESSION(S) / ED DIAGNOSES   Final diagnoses:  Cyclic vomiting syndrome      MEDICATIONS GIVEN DURING THIS VISIT:  Medications  sodium chloride 0.9 % bolus 1,000 mL (1,000 mLs Intravenous New Bag/Given 05/28/19 1251)  ondansetron (ZOFRAN) injection 4 mg (4 mg Intravenous Given 05/28/19 1246)  haloperidol lactate (HALDOL) injection 2 mg (2 mg Intravenous Given 05/28/19 1322)  iohexol (OMNIPAQUE) 300 MG/ML solution 100 mL (100 mLs Intravenous Contrast Given 05/28/19 1332)     ED Discharge Orders  Ordered    ondansetron (ZOFRAN) 4 MG tablet  Daily PRN     05/28/19 1422           Note:  This document was prepared using Dragon voice recognition software and may include unintentional dictation errors.   Vanessa Eakly, MD 05/28/19 [phone]

## 2019-05-28 NOTE — ED Triage Notes (Signed)
Pt with N/V x 3 weeks with some nights where he vomits 10 times. Unsure what brings spells on or what makes it end.

## 2019-07-19 ENCOUNTER — Inpatient Hospital Stay (HOSPITAL_COMMUNITY)
Admission: EM | Admit: 2019-07-19 | Discharge: 2019-07-27 | DRG: 897 | Disposition: A | Discharge status: Home / Self Care | Payer: Self-pay | Attending: Internal Medicine | Admitting: Internal Medicine

## 2019-07-19 ENCOUNTER — Emergency Department (HOSPITAL_COMMUNITY): Payer: Self-pay

## 2019-07-19 DIAGNOSIS — E860 Dehydration: Secondary | ICD-10-CM | POA: Diagnosis present

## 2019-07-19 DIAGNOSIS — Z823 Family history of stroke: Secondary | ICD-10-CM

## 2019-07-19 DIAGNOSIS — F13230 Sedative, hypnotic or anxiolytic dependence with withdrawal, uncomplicated: Secondary | ICD-10-CM

## 2019-07-19 DIAGNOSIS — F1290 Cannabis use, unspecified, uncomplicated: Secondary | ICD-10-CM | POA: Diagnosis present

## 2019-07-19 DIAGNOSIS — F13930 Sedative, hypnotic or anxiolytic use, unspecified with withdrawal, uncomplicated: Secondary | ICD-10-CM

## 2019-07-19 DIAGNOSIS — F1920 Other psychoactive substance dependence, uncomplicated: Secondary | ICD-10-CM

## 2019-07-19 DIAGNOSIS — I10 Essential (primary) hypertension: Secondary | ICD-10-CM | POA: Diagnosis present

## 2019-07-19 DIAGNOSIS — F10939 Alcohol use, unspecified with withdrawal, unspecified: Secondary | ICD-10-CM | POA: Diagnosis present

## 2019-07-19 DIAGNOSIS — F1110 Opioid abuse, uncomplicated: Secondary | ICD-10-CM | POA: Diagnosis present

## 2019-07-19 DIAGNOSIS — Y900 Blood alcohol level of less than 20 mg/100 ml: Secondary | ICD-10-CM | POA: Diagnosis present

## 2019-07-19 DIAGNOSIS — F10230 Alcohol dependence with withdrawal, uncomplicated: Principal | ICD-10-CM | POA: Diagnosis present

## 2019-07-19 DIAGNOSIS — Z79899 Other long term (current) drug therapy: Secondary | ICD-10-CM

## 2019-07-19 DIAGNOSIS — F32A Depression, unspecified: Secondary | ICD-10-CM | POA: Diagnosis present

## 2019-07-19 DIAGNOSIS — F10930 Alcohol use, unspecified with withdrawal, uncomplicated: Secondary | ICD-10-CM

## 2019-07-19 DIAGNOSIS — D696 Thrombocytopenia, unspecified: Secondary | ICD-10-CM | POA: Diagnosis present

## 2019-07-19 DIAGNOSIS — F1910 Other psychoactive substance abuse, uncomplicated: Secondary | ICD-10-CM | POA: Diagnosis present

## 2019-07-19 DIAGNOSIS — Z23 Encounter for immunization: Secondary | ICD-10-CM

## 2019-07-19 DIAGNOSIS — R7989 Other specified abnormal findings of blood chemistry: Secondary | ICD-10-CM | POA: Diagnosis present

## 2019-07-19 DIAGNOSIS — F329 Major depressive disorder, single episode, unspecified: Secondary | ICD-10-CM | POA: Diagnosis present

## 2019-07-19 DIAGNOSIS — N179 Acute kidney failure, unspecified: Secondary | ICD-10-CM | POA: Diagnosis present

## 2019-07-19 DIAGNOSIS — F419 Anxiety disorder, unspecified: Secondary | ICD-10-CM | POA: Diagnosis present

## 2019-07-19 DIAGNOSIS — Z20822 Contact with and (suspected) exposure to covid-19: Secondary | ICD-10-CM | POA: Diagnosis present

## 2019-07-19 DIAGNOSIS — F410 Panic disorder [episodic paroxysmal anxiety] without agoraphobia: Secondary | ICD-10-CM | POA: Diagnosis present

## 2019-07-19 DIAGNOSIS — I517 Cardiomegaly: Secondary | ICD-10-CM | POA: Diagnosis present

## 2019-07-19 DIAGNOSIS — E872 Acidosis: Secondary | ICD-10-CM | POA: Diagnosis present

## 2019-07-19 DIAGNOSIS — Z8249 Family history of ischemic heart disease and other diseases of the circulatory system: Secondary | ICD-10-CM

## 2019-07-19 DIAGNOSIS — F10239 Alcohol dependence with withdrawal, unspecified: Secondary | ICD-10-CM | POA: Diagnosis present

## 2019-07-19 DIAGNOSIS — E876 Hypokalemia: Secondary | ICD-10-CM | POA: Diagnosis not present

## 2019-07-19 DIAGNOSIS — F13239 Sedative, hypnotic or anxiolytic dependence with withdrawal, unspecified: Secondary | ICD-10-CM | POA: Diagnosis present

## 2019-07-19 DIAGNOSIS — R197 Diarrhea, unspecified: Secondary | ICD-10-CM | POA: Diagnosis not present

## 2019-07-19 LAB — HEPATIC FUNCTION PANEL
ALT: 175 U/L — ABNORMAL HIGH (ref 0–44)
AST: 261 U/L — ABNORMAL HIGH (ref 15–41)
Albumin: 2.9 g/dL — ABNORMAL LOW (ref 3.5–5.0)
Alkaline Phosphatase: 82 U/L (ref 38–126)
Bilirubin, Direct: 1.3 mg/dL — ABNORMAL HIGH (ref 0.0–0.2)
Indirect Bilirubin: 1.2 mg/dL — ABNORMAL HIGH (ref 0.3–0.9)
Total Bilirubin: 2.5 mg/dL — ABNORMAL HIGH (ref 0.3–1.2)
Total Protein: 5.9 g/dL — ABNORMAL LOW (ref 6.5–8.1)

## 2019-07-19 LAB — BASIC METABOLIC PANEL
Anion gap: 18 — ABNORMAL HIGH (ref 5–15)
BUN: 10 mg/dL (ref 6–20)
CO2: 16 mmol/L — ABNORMAL LOW (ref 22–32)
Calcium: 8.1 mg/dL — ABNORMAL LOW (ref 8.9–10.3)
Chloride: 101 mmol/L (ref 98–111)
Creatinine, Ser: 1.41 mg/dL — ABNORMAL HIGH (ref 0.61–1.24)
GFR calc Af Amer: >60 mL/min (ref >60)
GFR calc non Af Amer: >60 mL/min (ref >60)
Glucose, Bld: 96 mg/dL (ref 70–99)
Potassium: 4.5 mmol/L (ref 3.5–5.1)
Sodium: 135 mmol/L (ref 135–145)

## 2019-07-19 LAB — COMPREHENSIVE METABOLIC PANEL
ALT: 183 U/L — ABNORMAL HIGH (ref 0–44)
AST: 365 U/L — ABNORMAL HIGH (ref 15–41)
Albumin: 3.1 g/dL — ABNORMAL LOW (ref 3.5–5.0)
Alkaline Phosphatase: 94 U/L (ref 38–126)
Anion gap: 16 — ABNORMAL HIGH (ref 5–15)
BUN: 12 mg/dL (ref 6–20)
CO2: 19 mmol/L — ABNORMAL LOW (ref 22–32)
Calcium: 8.4 mg/dL — ABNORMAL LOW (ref 8.9–10.3)
Chloride: 100 mmol/L (ref 98–111)
Creatinine, Ser: 1.59 mg/dL — ABNORMAL HIGH (ref 0.61–1.24)
GFR calc Af Amer: >60 mL/min (ref >60)
GFR calc non Af Amer: 55 mL/min — ABNORMAL LOW (ref >60)
Glucose, Bld: 101 mg/dL — ABNORMAL HIGH (ref 70–99)
Potassium: 5.7 mmol/L — ABNORMAL HIGH (ref 3.5–5.1)
Sodium: 135 mmol/L (ref 135–145)
Total Bilirubin: 3.9 mg/dL — ABNORMAL HIGH (ref 0.3–1.2)
Total Protein: 6.2 g/dL — ABNORMAL LOW (ref 6.5–8.1)

## 2019-07-19 LAB — ETHANOL: Alcohol, Ethyl (B): <10 mg/dL (ref <10)

## 2019-07-19 LAB — MAGNESIUM
Magnesium: 1.9 mg/dL (ref 1.7–2.4)
Magnesium: 2 mg/dL (ref 1.7–2.4)

## 2019-07-19 LAB — RAPID URINE DRUG SCREEN, HOSP PERFORMED
Amphetamines: NOT DETECTED
Barbiturates: NOT DETECTED
Benzodiazepines: POSITIVE — AB
Cocaine: NOT DETECTED
Opiates: NOT DETECTED
Tetrahydrocannabinol: POSITIVE — AB

## 2019-07-19 LAB — URINALYSIS, ROUTINE W REFLEX MICROSCOPIC
Bilirubin Urine: NEGATIVE
Glucose, UA: NEGATIVE mg/dL
Hgb urine dipstick: NEGATIVE
Ketones, ur: 5 mg/dL — AB
Leukocytes,Ua: NEGATIVE
Nitrite: NEGATIVE
Protein, ur: NEGATIVE mg/dL
Specific Gravity, Urine: 1.012 (ref 1.005–1.030)
pH: 6 (ref 5.0–8.0)

## 2019-07-19 LAB — CBC
HCT: 55.8 % — ABNORMAL HIGH (ref 39.0–52.0)
HCT: 49.2 % (ref 39.0–52.0)
Hemoglobin: 20.4 g/dL — ABNORMAL HIGH (ref 13.0–17.0)
Hemoglobin: 17.8 g/dL — ABNORMAL HIGH (ref 13.0–17.0)
MCH: 34.2 pg — ABNORMAL HIGH (ref 26.0–34.0)
MCH: 34.4 pg — ABNORMAL HIGH (ref 26.0–34.0)
MCHC: 36.6 g/dL — ABNORMAL HIGH (ref 30.0–36.0)
MCHC: 36.2 g/dL — ABNORMAL HIGH (ref 30.0–36.0)
MCV: 93.6 fL (ref 80.0–100.0)
MCV: 95.2 fL (ref 80.0–100.0)
Platelets: 240 10*3/uL (ref 150–400)
Platelets: 200 10*3/uL (ref 150–400)
RBC: 5.96 MIL/uL — ABNORMAL HIGH (ref 4.22–5.81)
RBC: 5.17 MIL/uL (ref 4.22–5.81)
RDW: 15.3 % (ref 11.5–15.5)
RDW: 15.1 % (ref 11.5–15.5)
WBC: 6.9 10*3/uL (ref 4.0–10.5)
WBC: 5.5 10*3/uL (ref 4.0–10.5)
nRBC: 0.3 % — ABNORMAL HIGH (ref 0.0–0.2)
nRBC: 0.4 % — ABNORMAL HIGH (ref 0.0–0.2)

## 2019-07-19 LAB — PHOSPHORUS: Phosphorus: 2.7 mg/dL (ref 2.5–4.6)

## 2019-07-19 LAB — LIPASE, BLOOD: Lipase: 24 U/L (ref 11–51)

## 2019-07-19 LAB — TROPONIN I (HIGH SENSITIVITY)
Troponin I (High Sensitivity): 14 ng/L (ref <18)
Troponin I (High Sensitivity): 23 ng/L — ABNORMAL HIGH (ref <18)

## 2019-07-19 LAB — HIV ANTIBODY (ROUTINE TESTING W REFLEX): HIV Screen 4th Generation wRfx: NONREACTIVE

## 2019-07-19 LAB — SARS CORONAVIRUS 2 (TAT 6-24 HRS): SARS Coronavirus 2: NEGATIVE

## 2019-07-19 MED ORDER — THIAMINE HCL 100 MG/ML IJ SOLN
INTRAVENOUS | Status: DC
  Filled 2019-07-19: qty 1000

## 2019-07-19 MED ORDER — CLONIDINE HCL 0.1 MG PO TABS
0.1000 mg | ORAL_TABLET | Freq: Three times a day (TID) | ORAL | Status: DC | PRN
  Administered 2019-07-22: 0.1 mg via ORAL
  Filled 2019-07-19: qty 1

## 2019-07-19 MED ORDER — VITAMIN D 25 MCG (1000 UNIT) PO TABS
1000.0000 [IU] | ORAL_TABLET | Freq: Every day | ORAL | Status: DC
  Administered 2019-07-19 – 2019-07-27 (×9): 1000 [IU] via ORAL
  Filled 2019-07-19 (×10): qty 1

## 2019-07-19 MED ORDER — LORAZEPAM 1 MG PO TABS
0.0000 mg | ORAL_TABLET | Freq: Three times a day (TID) | ORAL | Status: AC
  Administered 2019-07-22: 2 mg via ORAL
  Administered 2019-07-22 – 2019-07-23 (×3): 3 mg via ORAL
  Filled 2019-07-19: qty 1
  Filled 2019-07-19 (×2): qty 3
  Filled 2019-07-19: qty 2
  Filled 2019-07-19: qty 3
  Filled 2019-07-19: qty 2

## 2019-07-19 MED ORDER — HYDROXYZINE HCL 25 MG PO TABS
25.0000 mg | ORAL_TABLET | Freq: Four times a day (QID) | ORAL | Status: DC | PRN
  Administered 2019-07-21 – 2019-07-23 (×5): 25 mg via ORAL
  Filled 2019-07-19 (×5): qty 1

## 2019-07-19 MED ORDER — DICYCLOMINE HCL 10 MG PO CAPS
10.0000 mg | ORAL_CAPSULE | Freq: Three times a day (TID) | ORAL | Status: DC | PRN
  Administered 2019-07-21 – 2019-07-27 (×11): 10 mg via ORAL
  Filled 2019-07-19 (×12): qty 1

## 2019-07-19 MED ORDER — LORAZEPAM 2 MG/ML IJ SOLN: 0.0000 mg | Freq: Two times a day (BID) | INTRAMUSCULAR | Status: DC

## 2019-07-19 MED ORDER — IBUPROFEN 600 MG PO TABS: 600.0000 mg | ORAL_TABLET | Freq: Four times a day (QID) | ORAL | Status: DC | PRN

## 2019-07-19 MED ORDER — SODIUM CHLORIDE 0.9 % IV BOLUS
1000.0000 mL | Freq: Once | INTRAVENOUS | Status: AC
  Administered 2019-07-19: 12:00:00 1000 mL via INTRAVENOUS

## 2019-07-19 MED ORDER — DOCUSATE SODIUM 100 MG PO CAPS
100.0000 mg | ORAL_CAPSULE | Freq: Two times a day (BID) | ORAL | Status: AC
  Administered 2019-07-19 – 2019-07-21 (×4): 100 mg via ORAL
  Filled 2019-07-19 (×4): qty 1

## 2019-07-19 MED ORDER — ONDANSETRON HCL 4 MG/2ML IJ SOLN
4.0000 mg | Freq: Four times a day (QID) | INTRAMUSCULAR | Status: DC | PRN
  Administered 2019-07-21 – 2019-07-22 (×2): 4 mg via INTRAVENOUS
  Filled 2019-07-19 (×2): qty 2

## 2019-07-19 MED ORDER — LISINOPRIL 10 MG PO TABS
5.0000 mg | ORAL_TABLET | Freq: Every day | ORAL | Status: DC
  Administered 2019-07-19: 5 mg via ORAL
  Filled 2019-07-19: qty 1

## 2019-07-19 MED ORDER — THIAMINE HCL 100 MG/ML IJ SOLN
INTRAVENOUS | Status: AC
  Filled 2019-07-19 (×11): qty 1000

## 2019-07-19 MED ORDER — LORAZEPAM 2 MG/ML IJ SOLN
1.0000 mg | INTRAMUSCULAR | Status: AC | PRN
  Administered 2019-07-19 – 2019-07-20 (×5): 1 mg via INTRAVENOUS
  Administered 2019-07-21 (×2): 2 mg via INTRAVENOUS
  Administered 2019-07-21: 4 mg via INTRAVENOUS
  Filled 2019-07-19 (×3): qty 1
  Filled 2019-07-19: qty 2
  Filled 2019-07-19: qty 1
  Filled 2019-07-19: qty 2
  Filled 2019-07-19 (×3): qty 1
  Filled 2019-07-19: qty 4

## 2019-07-19 MED ORDER — LORAZEPAM 1 MG PO TABS: 0.0000 mg | ORAL_TABLET | Freq: Two times a day (BID) | ORAL | Status: DC

## 2019-07-19 MED ORDER — LORAZEPAM 1 MG PO TABS
1.0000 mg | ORAL_TABLET | ORAL | Status: AC | PRN
  Administered 2019-07-20 – 2019-07-21 (×2): 2 mg via ORAL
  Administered 2019-07-21: 3 mg via ORAL
  Administered 2019-07-21 – 2019-07-22 (×3): 2 mg via ORAL
  Filled 2019-07-19: qty 3
  Filled 2019-07-19: qty 1
  Filled 2019-07-19 (×4): qty 2

## 2019-07-19 MED ORDER — THIAMINE HCL 100 MG/ML IJ SOLN
100.0000 mg | Freq: Every day | INTRAMUSCULAR | Status: DC
  Administered 2019-07-26: 100 mg via INTRAVENOUS
  Filled 2019-07-19: qty 2

## 2019-07-19 MED ORDER — THIAMINE HCL 100 MG PO TABS
100.0000 mg | ORAL_TABLET | Freq: Every day | ORAL | Status: DC
  Administered 2019-07-19 – 2019-07-27 (×8): 100 mg via ORAL
  Filled 2019-07-19 (×9): qty 1

## 2019-07-19 MED ORDER — SODIUM CHLORIDE 0.9% FLUSH
3.0000 mL | Freq: Once | INTRAVENOUS | Status: AC
  Administered 2019-07-19: 12:00:00 3 mL via INTRAVENOUS

## 2019-07-19 MED ORDER — LORAZEPAM 1 MG PO TABS: 0.0000 mg | ORAL_TABLET | Freq: Four times a day (QID) | ORAL | Status: DC

## 2019-07-19 MED ORDER — CARVEDILOL 12.5 MG PO TABS
12.5000 mg | ORAL_TABLET | Freq: Two times a day (BID) | ORAL | Status: DC
  Administered 2019-07-19 – 2019-07-23 (×8): 12.5 mg via ORAL
  Filled 2019-07-19 (×8): qty 1

## 2019-07-19 MED ORDER — HYDROXYZINE HCL 25 MG PO TABS: 25.0000 mg | ORAL_TABLET | Freq: Four times a day (QID) | ORAL | Status: DC | PRN

## 2019-07-19 MED ORDER — ONDANSETRON HCL 4 MG PO TABS
4.0000 mg | ORAL_TABLET | Freq: Four times a day (QID) | ORAL | Status: DC | PRN
  Administered 2019-07-25: 4 mg via ORAL
  Filled 2019-07-19: qty 1

## 2019-07-19 MED ORDER — LORAZEPAM 1 MG PO TABS
0.0000 mg | ORAL_TABLET | ORAL | Status: AC
  Administered 2019-07-20 (×4): 1 mg via ORAL
  Administered 2019-07-20: 2 mg via ORAL
  Administered 2019-07-21: 3 mg via ORAL
  Administered 2019-07-21: 4 mg via ORAL
  Administered 2019-07-21: 1 mg via ORAL
  Filled 2019-07-19 (×8): qty 1
  Filled 2019-07-19: qty 4

## 2019-07-19 MED ORDER — ENOXAPARIN SODIUM 40 MG/0.4ML ~~LOC~~ SOLN
40.0000 mg | SUBCUTANEOUS | Status: DC
  Administered 2019-07-19 – 2019-07-22 (×4): 40 mg via SUBCUTANEOUS
  Filled 2019-07-19 (×4): qty 0.4

## 2019-07-19 MED ORDER — LORAZEPAM 2 MG/ML IJ SOLN
0.0000 mg | Freq: Four times a day (QID) | INTRAMUSCULAR | Status: DC
  Administered 2019-07-19 (×2): 2 mg via INTRAVENOUS
  Filled 2019-07-19 (×2): qty 1

## 2019-07-19 MED ORDER — LORAZEPAM 2 MG/ML IJ SOLN
2.0000 mg | Freq: Once | INTRAMUSCULAR | Status: AC
  Administered 2019-07-19: 2 mg via INTRAVENOUS
  Filled 2019-07-19: qty 1

## 2019-07-19 NOTE — ED Triage Notes (Signed)
Pt here with c/o chest pain sob along with withdrawing from etoh ( drinks a 5th a day) and benzos ( takes about 6mg  xanax ad day ) last drink 5 am this morning , last xanax 2 days ago

## 2019-07-19 NOTE — ED Provider Notes (Signed)
This patient is a somewhat ill-appearing 34 year old male with a history of chronic alcoholism as well as a history of benzodiazepine use.  His last use of benzodiazepine has been greater than 48 hours, last use of alcohol was 4 AM but he ran out of liquor.  Since that time he started to have withdrawal symptoms including feeling diaphoretic, heart racing, weakness, dry mouth, shaking and tremulousness.  He reports he has not had anything to eat in a couple of days, he states that his substance abuse has been uncontrolled and very worsening over the last couple of months.  He also uses heroin intermittently last using 3 or 4 days ago.  He reports he sometimes uses this intravenously, sometimes he snorts it.  On exam the patient has mild diffuse abdominal tenderness but very soft, nonfocal.  His heart exam is unremarkable, he is not tachycardic on my exam though he was very tachycardic on arrival.  He is clammy, diaphoretic, has mild tremor, mucous membranes are slightly dehydrated, there is no edema.  His mental status is awake and alert, he is not hallucinating at this time.  The patient is likely in alcohol withdrawal, he is also likely very dehydrated from his lack of nutrition, his metabolic panel is pending, CBC shows high hemoglobin consistent with hemoconcentration and dehydration.  He will be given IV fluids, benzodiazepines, labs pending at this time   EKG Interpretation  Date/Time:  Thursday July 19 2019 11:03:04 EST Ventricular Rate:  156 PR Interval:  112 QRS Duration: 86 QT Interval:  260 QTC Calculation: 419 R Axis:   76 Text Interpretation: Sinus tachycardia Left ventricular hypertrophy with repolarization abnormality ( Sokolow-Lyon ) Abnormal ECG Since last tracing rate faster Confirmed by Eber Hong ([id number]) on 07/19/2019 11:49:58 AM       EKG Interpretation  Date/Time:  Thursday July 19 2019 12:15:39 EST Ventricular Rate:  90 PR Interval:  112 QRS Duration: 97 QT  Interval:  361 QTC Calculation: 442 R Axis:   77 Text Interpretation: Sinus rhythm Borderline repolarization abnormality Since last tracing rate slower Non-specific ST-t changes Confirmed by Eber Hong ([id number]) on 07/19/2019 12:26:01 PM        12:11 PM Cardiac monitoring reveals NSR rate of 95 (Rate & rhythm), as reviewed and interpreted by me. Cardiac monitoring was ordered due to tachycardia / weakness and to monitor patient for dysrhythmia.   Medical screening examination/treatment/procedure(s) were conducted as a shared visit with non-physician practitioner(s) and myself.  I personally evaluated the patient during the encounter.  Clinical Impression:   Final diagnoses:  Alcohol withdrawal syndrome without complication (HCC)  Benzodiazepine withdrawal without complication (HCC)         Eber Hong, MD 07/19/19 1722

## 2019-07-19 NOTE — Progress Notes (Signed)
NEW ADMISSION NOTE New Admission Note:   Arrival Method: Patient arrived from ED in the stretcher. Mental Orientation: Alert and oriented x 4. Telemetry: 56M-06, NSR Assessment: Completed Skin: Intact, warm, clammy. IV: Left FA infusing NS with folic acid @150  Pain: Denies any pain. Tubes:  N/A Safety Measures: Safety Fall Prevention Plan has been given, discussed and signed Admission: Completed [facility] Orientation: Patient has been orientated to the room, unit and staff.  Family: None Pt is on seizure precautions.  Orders have been reviewed and implemented. Will continue to monitor the patient. Call light has been placed within reach and bed alarm has been activated.   , RN

## 2019-07-19 NOTE — Plan of Care (Signed)
  Problem: Education: Goal: Knowledge of disease or condition will improve Outcome: Progressing   

## 2019-07-19 NOTE — ED Notes (Addendum)
Attempted Report x1.   

## 2019-07-19 NOTE — ED Provider Notes (Addendum)
[facility] EMERGENCY DEPARTMENT Provider Note   CSN: [id number] Arrival date & time: 07/19/19  1101     History No chief complaint on file.   Eric Keller is a 34 y.o. male.  HPI  Patient is a 34 year old male with a history of anxiety, depression, substance use, hypertension and alcohol and benzodiazepine use.  Patient is presented today for anxiety, tremors, chest pain, diaphoresis, heart racing, dry mouth, shortness of breath or feeling of flushed.  All the symptoms began this morning not long after his last drink of alcohol which was at 4 AM when he ran out of liquor.  His last benzodiazepine use was 2 days ago.  Patient states he is also been out of his blood pressure medications for the past 5 days.   Patient has had no oral intake in 2 days.  Patient states he also uses heroin last use was 3 to 4 days ago.  Occasionally he uses IV heroin.  Patient states he has been clean in the past most recently 4 months ago he was clean for 4 years of all opioids, cocaine, or alcohol.  Patient states that he has a balance at his primary care office and they are refusing to fill his medications until he pays that balance.  He is normally on lisinopril and Coreg.  Patient denies any history of withdrawal seizures or coma.  States his symptoms today feel identical to withdrawal symptoms in the past.    Past Medical History:  Diagnosis Date  . Anxiety   . Anxiety   . Depression   . H/O: substance abuse ([place])   . Hypertension     Patient Active Problem List   Diagnosis Date Noted  . Alcohol dependence with uncomplicated withdrawal ([place]) 07/19/2019  . Alcohol withdrawal ([place]) 07/19/2019  . Essential hypertension, benign 05/11/2018  . Flushing 05/11/2018  . Nonintractable headache 05/11/2018  . Erythrocytosis 05/11/2018  . Anxiety 04/14/2011  . Depression 04/14/2011    No past surgical history on file.     Family History  Problem Relation Age of Onset  .  Cancer Mother   . Hypertension Paternal Grandfather   . Heart disease Paternal Grandfather        MIs  . Heart disease Other   . Stroke Other     Social History   Tobacco Use  . Smoking status: Never Smoker  . Smokeless tobacco: Never Used  Substance Use Topics  . Alcohol use: Yes    Comment: couple drinks a month  . Drug use: Not Currently    Types: Marijuana    Comment: Vicodin Abuse    Home Medications Prior to Admission medications   Medication Sig Start Date End Date Taking? Authorizing Provider  carvedilol (COREG) 12.5 MG tablet Take 1 tablet (12.5 mg total) by mouth 2 (two) times daily with a meal. 03/29/19  Yes Raylene Everts, MD  Cholecalciferol (VITAMIN D3) 10 MCG (400 UNIT) CAPS Take 1 tablet by mouth daily.    Yes [provider]  lisinopril (ZESTRIL) 5 MG tablet Take 1 tablet (5 mg total) by mouth daily. 05/28/19 07/19/19 Yes Vanessa Skedee, MD  ondansetron (ZOFRAN) 4 MG tablet Take 1 tablet (4 mg total) by mouth daily as needed for nausea or vomiting. 05/28/19 05/27/20 Yes Vanessa Palmas, MD    Allergies    Patient has no known allergies.  Review of Systems   Review of Systems  Constitutional: Positive for diaphoresis and fatigue. Negative for  chills and fever.  HENT: Negative for congestion.        Dry mouth  Eyes: Negative for pain.  Respiratory: Positive for chest tightness and shortness of breath. Negative for cough.   Cardiovascular: Positive for chest pain. Negative for leg swelling.  Gastrointestinal: Negative for abdominal pain and vomiting.  Genitourinary: Negative for dysuria.  Musculoskeletal: Negative for myalgias.  Skin: Negative for rash.  Neurological: Negative for dizziness and headaches.  Psychiatric/Behavioral: The patient is nervous/anxious.     Physical Exam Updated Vital Signs BP (!) 159/112   Pulse 94   Temp 97.8 F (36.6 C)   Resp 11   SpO2 97%   Physical Exam Vitals and nursing note reviewed.  Constitutional:       General: He is not in acute distress.    Comments: Anxious appearing, tremulous 34 year old male appears somewhat older than stated age.  HENT:     Head: Normocephalic and atraumatic.     Nose: Nose normal.     Mouth/Throat:     Mouth: Mucous membranes are dry.     Pharynx: No posterior oropharyngeal erythema.  Eyes:     General: No scleral icterus.    Extraocular Movements: Extraocular movements intact.     Comments: Pupils are 4 mm and sluggish but reactive to light  Cardiovascular:     Rate and Rhythm: Normal rate and regular rhythm.     Pulses: Normal pulses.     Heart sounds: Normal heart sounds.  Pulmonary:     Effort: Pulmonary effort is normal. No respiratory distress.     Breath sounds: No wheezing.  Abdominal:     Palpations: Abdomen is soft.     Tenderness: There is no abdominal tenderness.  Musculoskeletal:     Cervical back: Normal range of motion and neck supple. No tenderness.     Right lower leg: No edema.     Left lower leg: No edema.  Skin:    General: Skin is warm and dry.     Capillary Refill: Capillary refill takes less than 2 seconds.     Comments: Patient skin appears somewhat flushed diffusely.  Neurological:     Mental Status: He is alert. Mental status is at baseline.     Comments: Strength intact all 4 limbs.  Sensation intact all 4 extremities.  Cranial nerves intact.  See eye exam for pupils  Psychiatric:        Mood and Affect: Mood normal.        Behavior: Behavior normal.     ED Results / Procedures / Treatments   Labs (all labs ordered are listed, but only abnormal results are displayed) Labs Reviewed  CBC - Abnormal; Notable for the following components:      Result Value   RBC 5.96 (*)    Hemoglobin 20.4 (*)    HCT 55.8 (*)    MCH 34.2 (*)    MCHC 36.6 (*)    nRBC 0.3 (*)    All other components within normal limits  BASIC METABOLIC PANEL - Abnormal; Notable for the following components:   CO2 16 (*)    Creatinine, Ser 1.41 (*)     Calcium 8.1 (*)    Anion gap 18 (*)    All other components within normal limits  HEPATIC FUNCTION PANEL - Abnormal; Notable for the following components:   Total Protein 5.9 (*)    Albumin 2.9 (*)    AST 261 (*)    ALT 175 (*)  Total Bilirubin 2.5 (*)    Bilirubin, Direct 1.3 (*)    Indirect Bilirubin 1.2 (*)    All other components within normal limits  RAPID URINE DRUG SCREEN, HOSP PERFORMED - Abnormal; Notable for the following components:   Benzodiazepines POSITIVE (*)    Tetrahydrocannabinol POSITIVE (*)    All other components within normal limits  TROPONIN I (HIGH SENSITIVITY) - Abnormal; Notable for the following components:   Troponin I (High Sensitivity) 23 (*)    All other components within normal limits  SARS CORONAVIRUS 2 (TAT 6-24 HRS)  MAGNESIUM  LIPASE, BLOOD  ETHANOL  URINALYSIS, ROUTINE W REFLEX MICROSCOPIC  COMPREHENSIVE METABOLIC PANEL  MAGNESIUM  PHOSPHORUS  CBC  HIV ANTIBODY (ROUTINE TESTING W REFLEX)  BASIC METABOLIC PANEL  TROPONIN I (HIGH SENSITIVITY)    EKG EKG Interpretation  Date/Time:  Thursday July 19 2019 12:15:39 EST Ventricular Rate:  90 PR Interval:  112 QRS Duration: 97 QT Interval:  361 QTC Calculation: 442 R Axis:   77 Text Interpretation: Sinus rhythm Borderline repolarization abnormality Since last tracing rate slower Non-specific ST-t changes Confirmed by Eber Hong ([id number]) on 07/19/2019 12:26:01 PM   Radiology DG Chest 2 View  Result Date: 07/19/2019 CLINICAL DATA:  Chest pain. EXAM: CHEST - 2 VIEW COMPARISON:  08/24/2018. FINDINGS: Normal heart, mediastinum and hila. Clear lungs.  No pleural effusion or pneumothorax. Skeletal structures are unremarkable. IMPRESSION: Normal chest radiographs. Electronically Signed   By: Amie Portland M.D.   On: 07/19/2019 11:54    Procedures Procedures (including critical care time)  Medications Ordered in ED Medications  LORazepam (ATIVAN) injection 0-4 mg (2 mg Intravenous  Given 07/19/19 1220)    Or  LORazepam (ATIVAN) tablet 0-4 mg ( Oral See Alternative 07/19/19 1220)  LORazepam (ATIVAN) injection 0-4 mg (has no administration in time range)    Or  LORazepam (ATIVAN) tablet 0-4 mg (has no administration in time range)  thiamine tablet 100 mg (100 mg Oral Given 07/19/19 1221)    Or  thiamine (B-1) injection 100 mg ( Intravenous See Alternative 07/19/19 1221)  docusate sodium (COLACE) capsule 100 mg (has no administration in time range)  carvedilol (COREG) tablet 12.5 mg (has no administration in time range)  lisinopril (ZESTRIL) tablet 5 mg (has no administration in time range)  Vitamin D3 CAPS 1 tablet (has no administration in time range)  LORazepam (ATIVAN) tablet 1-4 mg (has no administration in time range)    Or  LORazepam (ATIVAN) injection 1-4 mg (has no administration in time range)  LORazepam (ATIVAN) tablet 0-4 mg (has no administration in time range)    Followed by  LORazepam (ATIVAN) tablet 0-4 mg (has no administration in time range)  sodium chloride 0.9 % 1,000 mL with thiamine 100 mg, folic acid 1 mg, multivitamins adult 10 mL infusion (has no administration in time range)  enoxaparin (LOVENOX) injection 40 mg (has no administration in time range)  ibuprofen (ADVIL) tablet 600 mg (has no administration in time range)  ondansetron (ZOFRAN) tablet 4 mg (has no administration in time range)    Or  ondansetron (ZOFRAN) injection 4 mg (has no administration in time range)  sodium chloride flush (NS) 0.9 % injection 3 mL (3 mLs Intravenous Given 07/19/19 1222)  sodium chloride 0.9 % bolus 1,000 mL (0 mLs Intravenous Stopped 07/19/19 1200)  sodium chloride 0.9 % bolus 1,000 mL (0 mLs Intravenous Stopped 07/19/19 1329)  LORazepam (ATIVAN) injection 2 mg (2 mg Intravenous Given 07/19/19 1342)  ED Course  I have reviewed the triage vital signs and the nursing notes.  Pertinent labs & imaging results that were available during my care of the patient  were reviewed by me and considered in my medical decision making (see chart for details).  Clinical Course as of Jul 18 1718  Thu Jul 19, 2019  1550 UDS positive for [facility] and benzos negative for opioids   [WF]  1551 Troponin mildly elevated at 23 likely secondary to demand ischemia delta troponin is 9.  He denies any chest pain currently states that he feels somewhat tight in his chest similar to how he has felt since early this morning.  Troponin I (High Sensitivity)(!) [WF]  1551 No significant electrolyte abnormalities.  Anion gap of 18.  Likely tachycardia secondary to alcoholic ketoacidosis.  He is also in starvation state has not eaten for 2 days.  Basic metabolic panel(!) [WF]  1555 CBC with no anemia or leukocytosis.  Appears hemoconcentrated.  Likely secondary to patient's extreme dehydration and poor p.o. intake.  CBC(!) [WF]  1624 Discussed patient with Dr. Chipper Herb hospitalist who agrees to admit patient to hospital for monitoring and continued CIWA monitoring   [WF]    Clinical Course User Index [WF] Gailen Shelter, [place]   MDM Rules/Calculators/A&P                      Patient is a 34 year old male with a history of benzodiazepine and alcohol use as well as heroin use.  Presented today with symptoms of withdrawal.  Elevated CIWA score treated with 4 mg of Ativan and 2 L normal saline and placed on CIWA protocol and administered thiamine.  Patient has not eaten for 2 days and has had withdrawal symptoms all day today.  Attempted to deal with the symptoms at home before presenting to ED.  Initially tachycardic at 155 tachycardia improved to low 100s/110 with 2 L of normal saline and CIWA protocol.  Blood work and urine significant for extreme dehydration.  See more detailed lab analysis above.   3:57 PM -discussed plan to admit patient to hospitalist service as patient has continued tachycardia, with continued tremors and chest tightness and shortness of breath.  He does have mildly  elevated troponin likely secondary to demand.  Discussed this result with attending physician Dr. Hyacinth Meeker who agrees that patient is appropriate for hospital admission.  Covid test ordered and pending at this time.  Patient agreeable with plan for admission.   Final Clinical Impression(s) / ED Diagnoses Final diagnoses:  Alcohol withdrawal syndrome without complication (HCC)  Benzodiazepine withdrawal without complication [facility])    Rx / DC Orders ED Discharge Orders    None       Gailen Shelter, [place] 07/19/19 1600    Solon Augusta [place], [place] 07/19/19 1720    Eber Hong, MD 07/19/19 [phone]

## 2019-07-19 NOTE — H&P (Addendum)
History and Physical    [name] JQZ:[id number] DOB: [birth year] DOA: 07/19/2019  PCP: Jac Canavan, PA-C   Patient coming from: Home  I have personally briefly reviewed patient's old medical records in [facility]  Chief Complaint: I feel sick  HPI: Eric Keller is a 34 y.o. male with medical history significant of alcoholism and  benzodiazepine abuse and dependence presented with tremors and whole body aching for 2 days. Patient reported that he ran out of benzodiazepine for 3 days, and became unsteady and tremorous since 2 days ago, with significant nausea and was not able to catch up drinking as much as usual (for him, usually 1 Liter of hard liquor a day). His last use of alcohol was 4 AM about a cup of liquor. Soon after that, this morning he started to feel sweaty, heart racing, weakness, dry mouth, shaking and tremulousness. He reports he has not had anything to eat in a couple of days, he states that his substance abuse has been uncontrolled and very worsening over the last couple of months.  He also uses heroin intermittently last using 3 or 4 days ago.  He reports he sometimes uses this intravenously, sometimes he snorts it.  ED Course: Was found to be in withdrawing, CIWA initiated.  Review of Systems: As per HPI otherwise 10 point review of systems negative.    Past Medical History:  Diagnosis Date  . Anxiety   . Anxiety   . Depression   . H/O: substance abuse (HCC)   . Hypertension     No past surgical history on file.   reports that he has never smoked. He has never used smokeless tobacco. He reports current alcohol use. He reports previous drug use. Drug: Marijuana.  No Known Allergies  Family History  Problem Relation Age of Onset  . Cancer Mother   . Hypertension Paternal Grandfather   . Heart disease Paternal Grandfather        MIs  . Heart disease Other   . Stroke Other      Prior to Admission medications   Medication Sig Start Date  End Date Taking? Authorizing Provider  carvedilol (COREG) 12.5 MG tablet Take 1 tablet (12.5 mg total) by mouth 2 (two) times daily with a meal. 03/29/19  Yes Eustace Moore, MD  Cholecalciferol (VITAMIN D3) 10 MCG (400 UNIT) CAPS Take 1 tablet by mouth daily.    Yes [provider]  lisinopril (ZESTRIL) 5 MG tablet Take 1 tablet (5 mg total) by mouth daily. 05/28/19 07/19/19 Yes Concha Se, MD  ondansetron (ZOFRAN) 4 MG tablet Take 1 tablet (4 mg total) by mouth daily as needed for nausea or vomiting. 05/28/19 05/27/20 Yes Concha Se, MD    Physical Exam: Vitals:   07/19/19 1630 07/19/19 1645 07/19/19 1700 07/19/19 1758  BP: (!) 173/121 (!) 159/112 (!) 169/105 (!) 160/90  Pulse: (!) 103 94 (!) 102 (!) 101  Resp:  11 15 14   Temp:    99 F (37.2 C)  TempSrc:    Oral  SpO2:  97% 98% 97%  Weight:   91.1 kg   Height:   6\' 3"  (1.905 m)     Constitutional: NAD, calm, comfortable Vitals:   07/19/19 1630 07/19/19 1645 07/19/19 1700 07/19/19 1758  BP: (!) 173/121 (!) 159/112 (!) 169/105 (!) 160/90  Pulse: (!) 103 94 (!) 102 (!) 101  Resp:  11 15 14   Temp:    99 F (37.2 C)  TempSrc:    Oral  SpO2:  97% 98% 97%  Weight:   91.1 kg   Height:   6\' 3"  (1.905 m)    Eyes: PERRL, lids and conjunctivae normal ENMT: Mucous membranes are dry. Posterior pharynx clear of any exudate or lesions.Normal dentition.  Neck: normal, supple, no masses, no thyromegaly Respiratory: clear to auscultation bilaterally, no wheezing, no crackles. Normal respiratory effort. No accessory muscle use.  Cardiovascular: Regular rate and rhythm, no murmurs / rubs / gallops. No extremity edema. 2+ pedal pulses. No carotid bruits.  Abdomen: no tenderness, no masses palpated. No hepatosplenomegaly. Bowel sounds positive.  Musculoskeletal: no clubbing / cyanosis. No joint deformity upper and lower extremities. Good ROM, no contractures. Normal muscle tone.  Skin: no rashes, lesions, ulcers. No  induration Neurologic: CN 2-12 grossly intact. Sensation intact, DTR normal. Strength 5/5 in all 4.  Psychiatric: tremors on finger tips, very nervous looking    Labs on Admission: I have personally reviewed following labs and imaging studies  CBC: Recent Labs  Lab 07/19/19 1110 07/19/19 1702  WBC 6.9 5.5  HGB 20.4* 17.8*  HCT 55.8* 49.2  MCV 93.6 95.2  PLT 240 200   Basic Metabolic Panel: Recent Labs  Lab 07/19/19 1208  NA 135  K 4.5  CL 101  CO2 16*  GLUCOSE 96  BUN 10  CREATININE 1.41*  CALCIUM 8.1*  MG 1.9   GFR: Estimated Creatinine Clearance: 87.4 mL/min (A) (by C-G formula based on SCr of 1.41 mg/dL (H)). Liver Function Tests: Recent Labs  Lab 07/19/19 1422  AST 261*  ALT 175*  ALKPHOS 82  BILITOT 2.5*  PROT 5.9*  ALBUMIN 2.9*   Recent Labs  Lab 07/19/19 1422  LIPASE 24   No results for input(s): AMMONIA in the last 168 hours. Coagulation Profile: No results for input(s): INR, PROTIME in the last 168 hours. Cardiac Enzymes: No results for input(s): CKTOTAL, CKMB, CKMBINDEX, TROPONINI in the last 168 hours. BNP (last 3 results) No results for input(s): PROBNP in the last 8760 hours. HbA1C: No results for input(s): HGBA1C in the last 72 hours. CBG: No results for input(s): GLUCAP in the last 168 hours. Lipid Profile: No results for input(s): CHOL, HDL, LDLCALC, TRIG, CHOLHDL, LDLDIRECT in the last 72 hours. Thyroid Function Tests: No results for input(s): TSH, T4TOTAL, FREET4, T3FREE, THYROIDAB in the last 72 hours. Anemia Panel: No results for input(s): VITAMINB12, FOLATE, FERRITIN, TIBC, IRON, RETICCTPCT in the last 72 hours. Urine analysis:    Component Value Date/Time   COLORURINE YELLOW (A) 05/28/2019 1246   APPEARANCEUR CLEAR (A) 05/28/2019 1246   APPEARANCEUR Clear 12/10/2011 1538   LABSPEC 1.009 05/28/2019 1246   LABSPEC 1.004 12/10/2011 1538   PHURINE 6.0 05/28/2019 1246   GLUCOSEU NEGATIVE 05/28/2019 1246   GLUCOSEU Negative  12/10/2011 1538   HGBUR NEGATIVE 05/28/2019 1246   BILIRUBINUR NEGATIVE 05/28/2019 1246   BILIRUBINUR Negative 12/10/2011 1538   KETONESUR NEGATIVE 05/28/2019 1246   PROTEINUR NEGATIVE 05/28/2019 1246   UROBILINOGEN 0.2 12/17/2018 1836   NITRITE NEGATIVE 05/28/2019 1246   LEUKOCYTESUR NEGATIVE 05/28/2019 1246   LEUKOCYTESUR Negative 12/10/2011 1538    Radiological Exams on Admission: DG Chest 2 View  Result Date: 07/19/2019 CLINICAL DATA:  Chest pain. EXAM: CHEST - 2 VIEW COMPARISON:  08/24/2018. FINDINGS: Normal heart, mediastinum and hila. Clear lungs.  No pleural effusion or pneumothorax. Skeletal structures are unremarkable. IMPRESSION: Normal chest radiographs. Electronically Signed   By: 10/24/2018 M.D.   On:  07/19/2019 11:54    EKG: Independently reviewed.   Assessment/Plan Active Problems:   Alcohol dependence with uncomplicated withdrawal (HCC)   Alcohol withdrawal (HCC)  Alcohol withdrawal CIWA protocol with around-the-clock benzo, and as needed Ativan injection Banana bags x3 days. Seizure precautions.  AKI, likely from dehydration continue high rate of IV fluid and recheck BMP in the morning.  High anion gap metabolic acidosis, suspect alcoholic/starvation ketoacidosis, IV fluid and recheck.  Transaminitis, suspect alcoholic hepatitis, check Discriminate factor. Send HepB and HepC studies. Check RUQ U/S.  Heroin abuse, PRN clonidine, hydroxyzine, and Bentyl, so far no heroin withdraw symptoms. Consult transition care team, pt interested in drug rehab.  HTN uncontrolled Continue Coreg, add PRN clonidine D/C Lisinopril for AKI.  DVT prophylaxis: Heparin subcu Code Status: Full code Family Communication: None at bedside Disposition Plan: Drug rehab Consults called: None Admission status: Tele admit   Lequita Halt MD [facility] Pager [phone]  If 7PM-7AM, please contact night-coverage [email or web address] Password [facility]  07/19/2019, 7:02 PM

## 2019-07-20 ENCOUNTER — Inpatient Hospital Stay (HOSPITAL_COMMUNITY): Payer: Self-pay

## 2019-07-20 ENCOUNTER — Encounter (HOSPITAL_COMMUNITY): Payer: Self-pay | Admitting: Internal Medicine

## 2019-07-20 ENCOUNTER — Other Ambulatory Visit: Payer: Self-pay

## 2019-07-20 DIAGNOSIS — F1910 Other psychoactive substance abuse, uncomplicated: Secondary | ICD-10-CM | POA: Diagnosis present

## 2019-07-20 DIAGNOSIS — F10939 Alcohol use, unspecified with withdrawal, unspecified: Secondary | ICD-10-CM

## 2019-07-20 DIAGNOSIS — F10230 Alcohol dependence with withdrawal, uncomplicated: Principal | ICD-10-CM

## 2019-07-20 DIAGNOSIS — F10239 Alcohol dependence with withdrawal, unspecified: Secondary | ICD-10-CM

## 2019-07-20 HISTORY — DX: Alcohol dependence with withdrawal, unspecified: F10.239

## 2019-07-20 HISTORY — DX: Alcohol use, unspecified with withdrawal, unspecified: F10.939

## 2019-07-20 LAB — COMPREHENSIVE METABOLIC PANEL
ALT: 164 U/L — ABNORMAL HIGH (ref 0–44)
ALT: 163 U/L — ABNORMAL HIGH (ref 0–44)
AST: 326 U/L — ABNORMAL HIGH (ref 15–41)
AST: 281 U/L — ABNORMAL HIGH (ref 15–41)
Albumin: 2.7 g/dL — ABNORMAL LOW (ref 3.5–5.0)
Albumin: 2.7 g/dL — ABNORMAL LOW (ref 3.5–5.0)
Alkaline Phosphatase: 80 U/L (ref 38–126)
Alkaline Phosphatase: 80 U/L (ref 38–126)
Anion gap: 11 (ref 5–15)
Anion gap: 9 (ref 5–15)
BUN: 9 mg/dL (ref 6–20)
BUN: 6 mg/dL (ref 6–20)
CO2: 19 mmol/L — ABNORMAL LOW (ref 22–32)
CO2: 24 mmol/L (ref 22–32)
Calcium: 8.1 mg/dL — ABNORMAL LOW (ref 8.9–10.3)
Calcium: 8.5 mg/dL — ABNORMAL LOW (ref 8.9–10.3)
Chloride: 104 mmol/L (ref 98–111)
Chloride: 105 mmol/L (ref 98–111)
Creatinine, Ser: 1.41 mg/dL — ABNORMAL HIGH (ref 0.61–1.24)
Creatinine, Ser: 1.34 mg/dL — ABNORMAL HIGH (ref 0.61–1.24)
GFR calc Af Amer: >60 mL/min (ref >60)
GFR calc Af Amer: >60 mL/min (ref >60)
GFR calc non Af Amer: >60 mL/min (ref >60)
GFR calc non Af Amer: >60 mL/min (ref >60)
Glucose, Bld: 107 mg/dL — ABNORMAL HIGH (ref 70–99)
Glucose, Bld: 122 mg/dL — ABNORMAL HIGH (ref 70–99)
Potassium: 4.6 mmol/L (ref 3.5–5.1)
Potassium: 4.1 mmol/L (ref 3.5–5.1)
Sodium: 134 mmol/L — ABNORMAL LOW (ref 135–145)
Sodium: 138 mmol/L (ref 135–145)
Total Bilirubin: 2.6 mg/dL — ABNORMAL HIGH (ref 0.3–1.2)
Total Bilirubin: 2.3 mg/dL — ABNORMAL HIGH (ref 0.3–1.2)
Total Protein: 5.1 g/dL — ABNORMAL LOW (ref 6.5–8.1)
Total Protein: 5.5 g/dL — ABNORMAL LOW (ref 6.5–8.1)

## 2019-07-20 LAB — CBC
HCT: 42.6 % (ref 39.0–52.0)
Hemoglobin: 15 g/dL (ref 13.0–17.0)
MCH: 33.3 pg (ref 26.0–34.0)
MCHC: 35.2 g/dL (ref 30.0–36.0)
MCV: 94.7 fL (ref 80.0–100.0)
Platelets: 104 10*3/uL — ABNORMAL LOW (ref 150–400)
RBC: 4.5 MIL/uL (ref 4.22–5.81)
RDW: 14.8 % (ref 11.5–15.5)
WBC: 3.8 10*3/uL — ABNORMAL LOW (ref 4.0–10.5)
nRBC: 0.5 % — ABNORMAL HIGH (ref 0.0–0.2)

## 2019-07-20 LAB — HEPATITIS B CORE ANTIBODY, TOTAL: Hep B Core Total Ab: NONREACTIVE

## 2019-07-20 LAB — HEPATITIS A ANTIBODY, IGM: Hep A IgM: NONREACTIVE

## 2019-07-20 LAB — PROTIME-INR
INR: 1.1 (ref 0.8–1.2)
Prothrombin Time: 14.1 seconds (ref 11.4–15.2)

## 2019-07-20 LAB — HEPATITIS C ANTIBODY: HCV Ab: NONREACTIVE

## 2019-07-20 MED ORDER — PNEUMOCOCCAL VAC POLYVALENT 25 MCG/0.5ML IJ INJ
0.5000 mL | INJECTION | INTRAMUSCULAR | Status: DC
  Filled 2019-07-20: qty 0.5

## 2019-07-20 MED ORDER — INFLUENZA VAC SPLIT QUAD 0.5 ML IM SUSY
0.5000 mL | PREFILLED_SYRINGE | INTRAMUSCULAR | Status: AC
  Administered 2019-07-23: 0.5 mL via INTRAMUSCULAR
  Filled 2019-07-20: qty 0.5

## 2019-07-20 NOTE — Plan of Care (Signed)
  Problem: Health Behavior/Discharge Planning: Goal: Ability to identify changes in lifestyle to reduce recurrence of condition will improve Outcome: Progressing Goal: Identification of resources available to assist in meeting health care needs will improve Outcome: Progressing   Problem: Safety: Goal: Ability to remain free from injury will improve Outcome: Progressing   Problem: Activity: Goal: Risk for activity intolerance will decrease Outcome: Progressing   Problem: Nutrition: Goal: Adequate nutrition will be maintained Outcome: Progressing   Problem: Coping: Goal: Level of anxiety will decrease Outcome: Progressing   Problem: Elimination: Goal: Will not experience complications related to bowel motility Outcome: Progressing Goal: Will not experience complications related to urinary retention Outcome: Progressing   Problem: Pain Managment: Goal: General experience of comfort will improve Outcome: Progressing   Problem: Safety: Goal: Ability to remain free from injury will improve Outcome: Progressing   Problem: Skin Integrity: Goal: Risk for impaired skin integrity will decrease Outcome: Progressing

## 2019-07-20 NOTE — Progress Notes (Signed)
PROGRESS NOTE    Eric Keller  [id number]:[id number] DOB: 1986/03/09 DOA: 07/19/2019 PCP: Carlena Hurl, PA-C     Brief Narrative:  Patient is a 34 year old male with a medical history of alcoholism, benzodiazepine abuse/dependence, and hypertension who presented on 1/28 for tremors and myalgias.  A banana bag was started and CIWA initiated.  No seizure-like activity.  To him before.   New events last 24 hours / Subjective: He has been taking Xanax and Klonopin chronically.  His last dose was 3 days ago.  He has uncontrolled anxiety/depression.  He would like to see psychiatry.  He is unable to see his primary care provider due to an outstanding bill.  He drinks around 1/5 of liquor daily.  His last drink was around 4 AM on 1/28.  Assessment & Plan:   Principal Problem:   Alcohol dependence with uncomplicated withdrawal ([place])  Appreciate care management team  CIWA  Banana bag 150 mL/hr  Zofran prn  Active Problems:   Anxiety/Depression  Will need to see psych outpatient    Essential hypertension, benign  Coreg 12.5 mg bid    LFT elevation  Monitor CMP    Substance abuse ([place])   DVT prophylaxis: Lovenox Code Status: Full Family Communication: Self Disposition Plan: Patient is coming from home, likely discharged outpatient rehab  Objective: Vitals:   07/19/19 1758 07/19/19 1948 07/20/19 0632 07/20/19 0934  BP: (!) 160/90 108/86 (!) 145/100 (!) 161/91  Pulse: (!) 101 80 78 83  Resp: 14 18 18 18   Temp: 99 F (37.2 C) 98 F (36.7 C) 98.2 F (36.8 C) 98.2 F (36.8 C)  TempSrc: Oral   Oral  SpO2: 97% 97% 95% 99%  Weight:  91.1 kg    Height:        Intake/Output Summary (Last 24 hours) at 07/20/2019 1056 Last data filed at 07/20/2019 0935 Gross per 24 hour  Intake 2968.4 ml  Output 1850 ml  Net 1118.4 ml   Filed Weights   07/19/19 1700 07/19/19 1948  Weight: 91.1 kg 91.1 kg    Examination:  General exam: Appears calm and comfortable  Respiratory system:  Clear to auscultation. Respiratory effort normal. No respiratory distress. No conversational dyspnea.  Cardiovascular system: S1 & S2 heard, RRR. No murmurs. No pedal edema. Gastrointestinal system: Abdomen is nondistended, soft and mild tenderness to palpation. Normal bowel sounds heard. Central nervous system: Alert and oriented. No focal neurological deficits. Speech clear.  Extremities: Symmetric in appearance  Skin: No rashes, lesions or ulcers on exposed skin  Psychiatry: Judgement and insight appear normal. Mood & affect appropriate.   Data Reviewed: I have personally reviewed following labs and imaging studies  CBC: Recent Labs  Lab 07/19/19 1110 07/19/19 1702 07/20/19 0941  WBC 6.9 5.5 3.8*  HGB 20.4* 17.8* 15.0  HCT 55.8* 49.2 42.6  MCV 93.6 95.2 94.7  PLT 240 200 272*   Basic Metabolic Panel: Recent Labs  Lab 07/19/19 1208 07/19/19 1702 07/20/19 0012  NA 135 135 134*  K 4.5 5.7* 4.6  CL 101 100 104  CO2 16* 19* 19*  GLUCOSE 96 101* 107*  BUN 10 12 9   CREATININE 1.41* 1.59* 1.41*  CALCIUM 8.1* 8.4* 8.1*  MG 1.9 2.0  --   PHOS  --  2.7  --    GFR: Estimated Creatinine Clearance: 87.4 mL/min (A) (by C-G formula based on SCr of 1.41 mg/dL (H)).   Liver Function Tests: Recent Labs  Lab 07/19/19 1422 07/19/19 1702 07/20/19  0012  AST 261* 365* 326*  ALT 175* 183* 164*  ALKPHOS 82 94 80  BILITOT 2.5* 3.9* 2.6*  PROT 5.9* 6.2* 5.1*  ALBUMIN 2.9* 3.1* 2.7*   Recent Labs  Lab 07/19/19 1422  LIPASE 24   Coagulation Profile: Recent Labs  Lab 07/20/19 0012  INR 1.1    Recent Results (from the past 240 hour(s))  SARS CORONAVIRUS 2 (TAT 6-24 HRS) Nasopharyngeal Nasopharyngeal Swab     Status: None   Collection Time: 07/19/19  4:24 PM   Specimen: Nasopharyngeal Swab  Result Value Ref Range Status   SARS Coronavirus 2 NEGATIVE NEGATIVE Final    Comment: (NOTE) SARS-CoV-2 target nucleic acids are NOT DETECTED. The SARS-CoV-2 RNA is generally  detectable in upper and lower respiratory specimens during the acute phase of infection. Negative results do not preclude SARS-CoV-2 infection, do not rule out co-infections with other pathogens, and should not be used as the sole basis for treatment or other patient management decisions. Negative results must be combined with clinical observations, patient history, and epidemiological information. The expected result is Negative. Fact Sheet for Patients: HairSlick.no Fact Sheet for Healthcare Providers: quierodirigir.com This test is not yet approved or cleared by the [place] FDA and  has been authorized for detection and/or diagnosis of SARS-CoV-2 by FDA under an Emergency Use Authorization (EUA). This EUA will remain  in effect (meaning this test can be used) for the duration of the COVID-19 declaration under Section 56 4(b)(1) of the Act, 21 U.S.C. section 360bbb-3(b)(1), unless the authorization is terminated or revoked sooner. Performed at [facility], [address] [place], [place] [address]       Radiology Studies: DG Chest 2 View  Result Date: 07/19/2019 CLINICAL DATA:  Chest pain. EXAM: CHEST - 2 VIEW COMPARISON:  08/24/2018. FINDINGS: Normal heart, mediastinum and hila. Clear lungs.  No pleural effusion or pneumothorax. Skeletal structures are unremarkable. IMPRESSION: Normal chest radiographs. Electronically Signed   By: Amie Portland M.D.   On: 07/19/2019 11:54   US Abdomen Limited RUQ  Result Date: 07/20/2019 CLINICAL DATA:  Elevated LFTs EXAM: ULTRASOUND ABDOMEN LIMITED RIGHT UPPER QUADRANT COMPARISON:  CT abdomen pelvis 05/28/2019 FINDINGS: Gallbladder: No gallstones or wall thickening visualized. No sonographic Murphy sign noted by sonographer. Common bile duct: Diameter: 4 mm, nondilated Liver: Diffusely increased hepatic echogenicity with loss of definition of the portal triads and diminished  posterior through transmission compatible with hepatic steatosis. No focal lesion. Portal vein is patent on color Doppler imaging with normal direction of blood flow towards the liver. Other: None. IMPRESSION: Diffusely increased hepatic echogenicity with decreased through transmission is most compatible with hepatic steatosis. Otherwise unremarkable right upper quadrant ultrasound. Electronically Signed   By: Kreg Shropshire M.D.   On: 07/20/2019 05:19      Scheduled Meds: . carvedilol  12.5 mg Oral BID WC  . cholecalciferol  1,000 Units Oral Daily  . docusate sodium  100 mg Oral BID  . enoxaparin (LOVENOX) injection  40 mg Subcutaneous Q24H  . LORazepam  0-4 mg Oral Q4H   Followed by  . [START ON 07/21/2019] LORazepam  0-4 mg Oral Q8H  . thiamine  100 mg Oral Daily   Or  . thiamine  100 mg Intravenous Daily   Continuous Infusions: . banana bag IV 1000 mL 150 mL/hr at 07/20/19 0047     LOS: 1 day   Time spent: 25 minutes   Sharlene Dory, DO [facility] 07/20/2019, 10:56  AM   Available via Epic secure chat 7am-7pm After these hours, please refer to coverage provider listed on [email or web address]

## 2019-07-20 NOTE — Progress Notes (Signed)
Spoke with Eric Keller briefly this morning. He mentioned how important his faith is. He feels like he has "ran out of grace" and let his family down. He said he would like to talk more later on. This chaplain will continue to be available for support and spiritual care as he needs.   Rev. Margaretann Loveless Chaplain M. Div.

## 2019-07-20 NOTE — Plan of Care (Signed)
  Problem: Safety: Goal: Ability to remain free from injury will improve Outcome: Progressing   

## 2019-07-21 LAB — COMPREHENSIVE METABOLIC PANEL
ALT: 139 U/L — ABNORMAL HIGH (ref 0–44)
AST: 200 U/L — ABNORMAL HIGH (ref 15–41)
Albumin: 2.7 g/dL — ABNORMAL LOW (ref 3.5–5.0)
Alkaline Phosphatase: 71 U/L (ref 38–126)
Anion gap: 7 (ref 5–15)
BUN: <5 mg/dL — ABNORMAL LOW (ref 6–20)
CO2: 25 mmol/L (ref 22–32)
Calcium: 8.5 mg/dL — ABNORMAL LOW (ref 8.9–10.3)
Chloride: 105 mmol/L (ref 98–111)
Creatinine, Ser: 1.08 mg/dL (ref 0.61–1.24)
GFR calc Af Amer: >60 mL/min (ref >60)
GFR calc non Af Amer: >60 mL/min (ref >60)
Glucose, Bld: 105 mg/dL — ABNORMAL HIGH (ref 70–99)
Potassium: 3.4 mmol/L — ABNORMAL LOW (ref 3.5–5.1)
Sodium: 137 mmol/L (ref 135–145)
Total Bilirubin: 2.2 mg/dL — ABNORMAL HIGH (ref 0.3–1.2)
Total Protein: 5.5 g/dL — ABNORMAL LOW (ref 6.5–8.1)

## 2019-07-21 LAB — CBC
HCT: 40.9 % (ref 39.0–52.0)
Hemoglobin: 14.8 g/dL (ref 13.0–17.0)
MCH: 34 pg (ref 26.0–34.0)
MCHC: 36.2 g/dL — ABNORMAL HIGH (ref 30.0–36.0)
MCV: 94 fL (ref 80.0–100.0)
Platelets: 95 10*3/uL — ABNORMAL LOW (ref 150–400)
RBC: 4.35 MIL/uL (ref 4.22–5.81)
RDW: 14.7 % (ref 11.5–15.5)
WBC: 4.5 10*3/uL (ref 4.0–10.5)
nRBC: 0.4 % — ABNORMAL HIGH (ref 0.0–0.2)

## 2019-07-21 LAB — HEPATITIS B E ANTIGEN: Hep B E Ag: NEGATIVE

## 2019-07-21 MED ORDER — POTASSIUM CHLORIDE CRYS ER 20 MEQ PO TBCR
40.0000 meq | EXTENDED_RELEASE_TABLET | Freq: Once | ORAL | Status: AC
  Administered 2019-07-21: 40 meq via ORAL
  Filled 2019-07-21: qty 2

## 2019-07-21 MED ORDER — PROMETHAZINE HCL 25 MG PO TABS
25.0000 mg | ORAL_TABLET | Freq: Four times a day (QID) | ORAL | Status: DC | PRN
  Administered 2019-07-21 – 2019-07-25 (×5): 25 mg via ORAL
  Filled 2019-07-21 (×5): qty 1

## 2019-07-21 NOTE — Progress Notes (Signed)
PROGRESS NOTE    Eric Keller  ZWC:[id number] DOB: 19-Mar-1986 DOA: 07/19/2019 PCP: Jac Canavan, PA-C     Brief Narrative:  Patient is a 34 year old male with a medical history of alcoholism, benzodiazepine abuse/dependence, and hypertension who presented on 1/28 for tremors and myalgias.  A banana bag was started and CIWA initiated.  No seizure-like activity.  To him before.   New events last 24 hours / Subjective: He reports having nausea, his appetite is poor. Denies vomiting. He drinks around 1/5 of liquor daily. Denies tremors or hallucinations but has anxiety. His last drink was around 4 AM on 1/28.  Assessment & Plan:   Principal Problem:   Alcohol dependence with uncomplicated withdrawal ([facility])  Appreciate care management team  CIWA  Continue Banana bag 150 mL/hr  Zofran prn  Active Problems:   Anxiety/Depression  Will need to see psych outpatient, will request eval while here to potentially start suboxone which he reports he was on before     Essential hypertension, benign  Coreg 12.5 mg bid    LFT elevation  Monitor CMP    Substance abuse ([facility])   DVT prophylaxis: Lovenox Code Status: Full Family Communication: Self Disposition Plan: Patient is coming from home, likely discharge to outpatient rehab  Objective: Vitals:   07/21/19 0122 07/21/19 0411 07/21/19 0949 07/21/19 1746  BP: (!) 145/93 138/90 (!) 134/95 (!) 142/85  Pulse: 64 61 74 72  Resp:  18 18 18   Temp:   98.3 F (36.8 C) 99 F (37.2 C)  TempSrc:  Oral Oral Oral  SpO2:  98% 97% 97%  Weight:      Height:        Intake/Output Summary (Last 24 hours) at 07/21/2019 1904 Last data filed at 07/21/2019 1700 Gross per 24 hour  Intake 4972.25 ml  Output 3050 ml  Net 1922.25 ml   Filed Weights   07/19/19 1700 07/19/19 1948  Weight: 91.1 kg 91.1 kg    Examination:  General exam: Appears calm and comfortable  Respiratory system: Respiratory effort normal. No respiratory distress. No  conversational dyspnea.  Cardiovascular system: S1 & S2 present, RRR. No pedal edema. Gastrointestinal system: Abdomen is nondistended, soft and mild tenderness to palpation. Normal bowel sounds heard. Central nervous system: Alert and oriented. No focal neurological deficits. Speech clear.  Extremities: Symmetric in appearance  Skin: No rashes, lesions or ulcers on exposed skin  Psychiatry: Mood & affect appropriate.   Data Reviewed: I have personally reviewed following labs and imaging studies  CBC: Recent Labs  Lab 07/19/19 1110 07/19/19 1702 07/20/19 0941 07/21/19 0325  WBC 6.9 5.5 3.8* 4.5  HGB 20.4* 17.8* 15.0 14.8  HCT 55.8* 49.2 42.6 40.9  MCV 93.6 95.2 94.7 94.0  PLT 240 200 104* 95*   Basic Metabolic Panel: Recent Labs  Lab 07/19/19 1208 07/19/19 1702 07/20/19 0012 07/20/19 0941 07/21/19 0325  NA 135 135 134* 138 137  K 4.5 5.7* 4.6 4.1 3.4*  CL 101 100 104 105 105  CO2 16* 19* 19* 24 25  GLUCOSE 96 101* 107* 122* 105*  BUN 10 12 9 6  <5*  CREATININE 1.41* 1.59* 1.41* 1.34* 1.08  CALCIUM 8.1* 8.4* 8.1* 8.5* 8.5*  MG 1.9 2.0  --   --   --   PHOS  --  2.7  --   --   --    GFR: Estimated Creatinine Clearance: 114.1 mL/min (by C-G formula based on SCr of 1.08 mg/dL).   Liver Function  Tests: Recent Labs  Lab 07/19/19 1422 07/19/19 1702 07/20/19 0012 07/20/19 0941 07/21/19 0325  AST 261* 365* 326* 281* 200*  ALT 175* 183* 164* 163* 139*  ALKPHOS 82 94 80 80 71  BILITOT 2.5* 3.9* 2.6* 2.3* 2.2*  PROT 5.9* 6.2* 5.1* 5.5* 5.5*  ALBUMIN 2.9* 3.1* 2.7* 2.7* 2.7*   Recent Labs  Lab 07/19/19 1422  LIPASE 24   Coagulation Profile: Recent Labs  Lab 07/20/19 0012  INR 1.1    Recent Results (from the past 240 hour(s))  SARS CORONAVIRUS 2 (TAT 6-24 HRS) Nasopharyngeal Nasopharyngeal Swab     Status: None   Collection Time: 07/19/19  4:24 PM   Specimen: Nasopharyngeal Swab  Result Value Ref Range Status   SARS Coronavirus 2 NEGATIVE NEGATIVE Final     Comment: (NOTE) SARS-CoV-2 target nucleic acids are NOT DETECTED. The SARS-CoV-2 RNA is generally detectable in upper and lower respiratory specimens during the acute phase of infection. Negative results do not preclude SARS-CoV-2 infection, do not rule out co-infections with other pathogens, and should not be used as the sole basis for treatment or other patient management decisions. Negative results must be combined with clinical observations, patient history, and epidemiological information. The expected result is Negative. Fact Sheet for Patients: SugarRoll.be Fact Sheet for Healthcare Providers: https://www.woods-mathews.com/ This test is not yet approved or cleared by the [place] FDA and  has been authorized for detection and/or diagnosis of SARS-CoV-2 by FDA under an Emergency Use Authorization (EUA). This EUA will remain  in effect (meaning this test can be used) for the duration of the COVID-19 declaration under Section 56 4(b)(1) of the Act, 21 U.S.C. section 360bbb-3(b)(1), unless the authorization is terminated or revoked sooner. Performed at [facility], [place] [address] [place], [place] [address]       Radiology Studies: US Abdomen Limited RUQ  Result Date: 07/20/2019 CLINICAL DATA:  Elevated LFTs EXAM: ULTRASOUND ABDOMEN LIMITED RIGHT UPPER QUADRANT COMPARISON:  CT abdomen pelvis 05/28/2019 FINDINGS: Gallbladder: No gallstones or wall thickening visualized. No sonographic Murphy sign noted by sonographer. Common bile duct: Diameter: 4 mm, nondilated Liver: Diffusely increased hepatic echogenicity with loss of definition of the portal triads and diminished posterior through transmission compatible with hepatic steatosis. No focal lesion. Portal vein is patent on color Doppler imaging with normal direction of blood flow towards the liver. Other: None. IMPRESSION: Diffusely increased hepatic echogenicity with decreased  through transmission is most compatible with hepatic steatosis. Otherwise unremarkable right upper quadrant ultrasound. Electronically Signed   By: Lovena Le M.D.   On: 07/20/2019 05:19      Scheduled Meds: . carvedilol  12.5 mg Oral BID WC  . cholecalciferol  1,000 Units Oral Daily  . enoxaparin (LOVENOX) injection  40 mg Subcutaneous Q24H  . influenza vac split quadrivalent PF  0.5 mL Intramuscular Tomorrow-1000  . LORazepam  0-4 mg Oral Q8H  . pneumococcal 23 valent vaccine  0.5 mL Intramuscular Tomorrow-1000  . thiamine  100 mg Oral Daily   Or  . thiamine  100 mg Intravenous Daily   Continuous Infusions: . banana bag IV 1000 mL 150 mL/hr at 07/21/19 1215     LOS: 2 days   Time spent: 25 minutes   Blain Pais, MD [facility] 07/21/2019, 7:04 PM   Available via Epic secure chat 7am-7pm After these hours, please refer to coverage provider listed on [email or web address]

## 2019-07-22 DIAGNOSIS — F13930 Sedative, hypnotic or anxiolytic use, unspecified with withdrawal, uncomplicated: Secondary | ICD-10-CM

## 2019-07-22 DIAGNOSIS — F1920 Other psychoactive substance dependence, uncomplicated: Secondary | ICD-10-CM

## 2019-07-22 DIAGNOSIS — F13230 Sedative, hypnotic or anxiolytic dependence with withdrawal, uncomplicated: Secondary | ICD-10-CM

## 2019-07-22 LAB — COMPREHENSIVE METABOLIC PANEL
ALT: 129 U/L — ABNORMAL HIGH (ref 0–44)
AST: 154 U/L — ABNORMAL HIGH (ref 15–41)
Albumin: 2.7 g/dL — ABNORMAL LOW (ref 3.5–5.0)
Alkaline Phosphatase: 65 U/L (ref 38–126)
Anion gap: 8 (ref 5–15)
BUN: <5 mg/dL — ABNORMAL LOW (ref 6–20)
CO2: 25 mmol/L (ref 22–32)
Calcium: 8.8 mg/dL — ABNORMAL LOW (ref 8.9–10.3)
Chloride: 108 mmol/L (ref 98–111)
Creatinine, Ser: 1.17 mg/dL (ref 0.61–1.24)
GFR calc Af Amer: >60 mL/min (ref >60)
GFR calc non Af Amer: >60 mL/min (ref >60)
Glucose, Bld: 101 mg/dL — ABNORMAL HIGH (ref 70–99)
Potassium: 3.7 mmol/L (ref 3.5–5.1)
Sodium: 141 mmol/L (ref 135–145)
Total Bilirubin: 1.8 mg/dL — ABNORMAL HIGH (ref 0.3–1.2)
Total Protein: 5.4 g/dL — ABNORMAL LOW (ref 6.5–8.1)

## 2019-07-22 LAB — CBC
HCT: 41.4 % (ref 39.0–52.0)
Hemoglobin: 14.3 g/dL (ref 13.0–17.0)
MCH: 33.4 pg (ref 26.0–34.0)
MCHC: 34.5 g/dL (ref 30.0–36.0)
MCV: 96.7 fL (ref 80.0–100.0)
Platelets: 87 10*3/uL — ABNORMAL LOW (ref 150–400)
RBC: 4.28 MIL/uL (ref 4.22–5.81)
RDW: 14.8 % (ref 11.5–15.5)
WBC: 5 10*3/uL (ref 4.0–10.5)
nRBC: 0 % (ref 0.0–0.2)

## 2019-07-22 LAB — MAGNESIUM: Magnesium: 1.9 mg/dL (ref 1.7–2.4)

## 2019-07-22 MED ORDER — GABAPENTIN 300 MG PO CAPS
300.0000 mg | ORAL_CAPSULE | Freq: Two times a day (BID) | ORAL | Status: DC
  Administered 2019-07-22 – 2019-07-25 (×8): 300 mg via ORAL
  Filled 2019-07-22 (×8): qty 1

## 2019-07-22 MED ORDER — FLUOXETINE HCL 10 MG PO CAPS
10.0000 mg | ORAL_CAPSULE | Freq: Every day | ORAL | Status: DC
  Administered 2019-07-22 – 2019-07-23 (×2): 10 mg via ORAL
  Filled 2019-07-22 (×2): qty 1

## 2019-07-22 NOTE — Consult Note (Addendum)
Telepsych Consultation   Reason for Consult:  ''IV drug use, wants rehab.'' Referring Physician:  Sara Chu, MD Location of Patient: [id number] Location of Provider: [facility]  Patient Identification: Eric Keller MRN:  [id number] Principal Diagnosis: Polysubstance dependence including opioid type drug with complication, continuous use (HCC) Diagnosis:  Principal Problem:   Polysubstance dependence including opioid type drug with complication, continuous use (HCC) Active Problems:   Anxiety   Depression   Essential hypertension, benign   Alcohol dependence with uncomplicated withdrawal (HCC)   LFT elevation   Substance abuse (HCC)   Benzodiazepine withdrawal without complication (HCC)   Total Time spent with patient: 1 hour  Subjective:   Eric Keller is a 34 y.o. male patient admitted with anxiety, depression and polysubstance use.  HPI: 34 y.o. male with medical history significant of Polysubstance dependence(Opiates, Alcohol, Benzodiazepine abuse and Cannabis) who was admitted for nausea, tremors and muscle ache. Patient reports long history of multiple substance dependence since 72. He reports daily use of about 6-8mg  of Xanax, Suboxone/IV Heroin which he buys off the street. He also reports excessive use of Alcohol and smokes Cannabis on daily basis. Patient reports history of drugs/alcohol rehab 3-4x time in the past and longest sobriety was between 2015 to 2019. However, his last rehab was at [facility]  6 months ago, was sober for 1 day before he relapsed after he lost his job.Few days after that he reports that he became more anxious, depressed and using more drugs because the country was locked down due to Covid-19 pandemic. He states that he lives with his father who has told him he needs to clean up his drug use or else he is no longer welcome in his home. Today, patient still report feeling anxious, depressed, nauseous, mild tremors, apprehensive but denies  psychosis, delusions or self harming thoughts. Per patient, last opiate use was 5 days ago, THC and Benzo-4 days ago.  Past Psychiatric History:   Risk to Self:  denies Risk to Others:  denies Prior Inpatient Therapy:  none reported Prior Outpatient Therapy:  [facility] in the past  Past Medical History:  Past Medical History:  Diagnosis Date  . Alcohol withdrawal (HCC) 07/20/2019  . Anxiety   . Anxiety   . Depression   . H/O: substance abuse (HCC)   . Hypertension     Past Surgical History:  Procedure Laterality Date  . WISDOM TOOTH EXTRACTION     Family History:  Family History  Problem Relation Age of Onset  . Cancer Mother   . Hypertension Paternal Grandfather   . Heart disease Paternal Grandfather        MIs  . Heart disease Other   . Stroke Other    Family Psychiatric  History: Patient reports that his Uncle drinks Social History:  Social History   Substance and Sexual Activity  Alcohol Use Yes   Comment: daily 5th of liquor     Social History   Substance and Sexual Activity  Drug Use Not Currently  . Types: Marijuana   Comment: Vicodin Abuse    Social History   Socioeconomic History  . Marital status: Single    Spouse name: Not on file  . Number of children: Not on file  . Years of education: Not on file  . Highest education level: Not on file  Occupational History  . Not on file  Tobacco Use  . Smoking status: Never Smoker  . Smokeless tobacco: Never Used  Substance and Sexual Activity  .  Alcohol use: Yes    Comment: daily 5th of liquor  . Drug use: Not Currently    Types: Marijuana    Comment: Vicodin Abuse  . Sexual activity: Not on file  Other Topics Concern  . Not on file  Social History Narrative  . Not on file   Social Determinants of Health   Financial Resource Strain:   . Difficulty of Paying Living Expenses: Not on file  Food Insecurity:   . Worried About Programme researcher, broadcasting/film/video in the Last Year: Not on file  . Ran Out of Food in  the Last Year: Not on file  Transportation Needs:   . Lack of Transportation (Medical): Not on file  . Lack of Transportation (Non-Medical): Not on file  Physical Activity:   . Days of Exercise per Week: Not on file  . Minutes of Exercise per Session: Not on file  Stress:   . Feeling of Stress : Not on file  Social Connections:   . Frequency of Communication with Friends and Family: Not on file  . Frequency of Social Gatherings with Friends and Family: Not on file  . Attends Religious Services: Not on file  . Active Member of Clubs or Organizations: Not on file  . Attends Banker Meetings: Not on file  . Marital Status: Not on file   Additional Social History:    Allergies:  No Known Allergies  Labs:  Results for orders placed or performed during the hospital encounter of 07/19/19 (from the past 48 hour(s))  CBC     Status: Abnormal   Collection Time: 07/21/19  3:25 AM  Result Value Ref Range   WBC 4.5 4.0 - 10.5 K/uL   RBC 4.35 4.22 - 5.81 MIL/uL   Hemoglobin 14.8 13.0 - 17.0 g/dL   HCT 86.7 61.9 - 50.9 %   MCV 94.0 80.0 - 100.0 fL   MCH 34.0 26.0 - 34.0 pg   MCHC 36.2 (H) 30.0 - 36.0 g/dL   RDW 32.6 71.2 - 45.8 %   Platelets 95 (L) 150 - 400 K/uL    Comment: REPEATED TO VERIFY Immature Platelet Fraction may be clinically indicated, consider ordering this additional test [id number] CONSISTENT WITH PREVIOUS RESULT    nRBC 0.4 (H) 0.0 - 0.2 %    Comment: Performed at [facility], [address], [place], [place] [address]  Comprehensive metabolic panel     Status: Abnormal   Collection Time: 07/21/19  3:25 AM  Result Value Ref Range   Sodium 137 135 - 145 mmol/L   Potassium 3.4 (L) 3.5 - 5.1 mmol/L   Chloride 105 98 - 111 mmol/L   CO2 25 22 - 32 mmol/L   Glucose, Bld 105 (H) 70 - 99 mg/dL   BUN <5 (L) 6 - 20 mg/dL   Creatinine, Ser 7.67 0.61 - 1.24 mg/dL   Calcium 8.5 (L) 8.9 - 10.3 mg/dL   Total Protein 5.5 (L) 6.5 - 8.1 g/dL   Albumin 2.7  (L) 3.5 - 5.0 g/dL   AST 341 (H) 15 - 41 U/L   ALT 139 (H) 0 - 44 U/L   Alkaline Phosphatase 71 38 - 126 U/L   Total Bilirubin 2.2 (H) 0.3 - 1.2 mg/dL   GFR calc non Af Amer >60 >60 mL/min   GFR calc Af Amer >60 >60 mL/min   Anion gap 7 5 - 15    Comment: Performed at [facility], [address], [place], [place] [address]  CBC     Status: Abnormal   Collection Time: 07/22/19  6:29 AM  Result Value Ref Range   WBC 5.0 4.0 - 10.5 K/uL   RBC 4.28 4.22 - 5.81 MIL/uL   Hemoglobin 14.3 13.0 - 17.0 g/dL   HCT 41.4 39.0 - 52.0 %   MCV 96.7 80.0 - 100.0 fL   MCH 33.4 26.0 - 34.0 pg   MCHC 34.5 30.0 - 36.0 g/dL   RDW 14.8 11.5 - 15.5 %   Platelets 87 (L) 150 - 400 K/uL    Comment: Immature Platelet Fraction may be clinically indicated, consider ordering this additional test [id number] CONSISTENT WITH PREVIOUS RESULT    nRBC 0.0 0.0 - 0.2 %    Comment: Performed at [facility], [place] [address], [place], [place] [address]  Comprehensive metabolic panel     Status: Abnormal   Collection Time: 07/22/19  6:29 AM  Result Value Ref Range   Sodium 141 135 - 145 mmol/L   Potassium 3.7 3.5 - 5.1 mmol/L   Chloride 108 98 - 111 mmol/L   CO2 25 22 - 32 mmol/L   Glucose, Bld 101 (H) 70 - 99 mg/dL   BUN <5 (L) 6 - 20 mg/dL   Creatinine, Ser 1.17 0.61 - 1.24 mg/dL   Calcium 8.8 (L) 8.9 - 10.3 mg/dL   Total Protein 5.4 (L) 6.5 - 8.1 g/dL   Albumin 2.7 (L) 3.5 - 5.0 g/dL   AST 154 (H) 15 - 41 U/L   ALT 129 (H) 0 - 44 U/L   Alkaline Phosphatase 65 38 - 126 U/L   Total Bilirubin 1.8 (H) 0.3 - 1.2 mg/dL   GFR calc non Af Amer >60 >60 mL/min   GFR calc Af Amer >60 >60 mL/min   Anion gap 8 5 - 15    Comment: Performed at [facility], [place] [address], [place], [place] [address]  Magnesium     Status: None   Collection Time: 07/22/19  6:29 AM  Result Value Ref Range   Magnesium 1.9 1.7 - 2.4 mg/dL    Comment: Performed at [facility] [address] [place], [place]  [address]    Medications:  Current Facility-Administered Medications  Medication Dose Route Frequency Provider Last Rate Last Admin  . carvedilol (COREG) tablet 12.5 mg  12.5 mg Oral BID WC Wynetta Fines T, MD   12.5 mg at 07/22/19 0856  . cholecalciferol (VITAMIN D3) tablet 1,000 Units  1,000 Units Oral Daily Lequita Halt, MD   1,000 Units at 07/22/19 [phone]  . cloNIDine (CATAPRES) tablet 0.1 mg  0.1 mg Oral TID PRN Wynetta Fines T, MD   0.1 mg at 07/22/19 1210  . dicyclomine (BENTYL) capsule 10 mg  10 mg Oral TID PRN Lequita Halt, MD   10 mg at 07/21/19 1221  . enoxaparin (LOVENOX) injection 40 mg  40 mg Subcutaneous Q24H Wynetta Fines T, MD   40 mg at 07/21/19 1701  . hydrOXYzine (ATARAX/VISTARIL) tablet 25 mg  25 mg Oral Q6H PRN Lequita Halt, MD   25 mg at 07/22/19 0858  . influenza vac split quadrivalent PF (FLUARIX) injection 0.5 mL  0.5 mL Intramuscular Tomorrow-1000 Shelda Pal, DO      . LORazepam (ATIVAN) tablet 1-4 mg  1-4 mg Oral Q1H PRN Wynetta Fines T, MD   2 mg at 07/22/19 1210   Or  . LORazepam (ATIVAN)  injection 1-4 mg  1-4 mg Intravenous Q1H PRN Mikey CollegeZhang, Ping T, MD   2 mg at 07/21/19 1701  . LORazepam (ATIVAN) tablet 0-4 mg  0-4 mg Oral Q8H Emeline GeneralZhang, Ping T, MD   3 mg at 07/22/19 0857  . ondansetron (ZOFRAN) tablet 4 mg  4 mg Oral Q6H PRN Mikey CollegeZhang, Ping T, MD       Or  . ondansetron [facility](ZOFRAN) injection 4 mg  4 mg Intravenous Q6H PRN Emeline GeneralZhang, Ping T, MD   4 mg at 07/22/19 0858  . pneumococcal 23 valent vaccine (PNEUMOVAX-23) injection 0.5 mL  0.5 mL Intramuscular Tomorrow-1000 Wendling, Jilda RocheNicholas Xavien, DO      . promethazine (PHENERGAN) tablet 25 mg  25 mg Oral Q6H PRN Sara ChuKennerly, Solianny D, MD   25 mg at 07/22/19 1210  . sodium chloride 0.9 % 1,000 mL with thiamine 100 mg, folic acid 1 mg, multivitamins adult 10 mL infusion   Intravenous Continuous Mikey CollegeZhang, Ping T, MD 150 mL/hr at 07/22/19 1101 New Bag at 07/22/19 1101  . thiamine tablet 100 mg  100 mg Oral Daily Solon AugustaFondaw, Wylder S, [place]   100  mg at 07/22/19 [id number]   Or  . thiamine (B-1) injection 100 mg  100 mg Intravenous Daily Gailen ShelterFondaw, Wylder S, [place]        Musculoskeletal: Strength & Muscle Tone: not tested Gait & Station: not tested Patient leans: N/A  Psychiatric Specialty Exam: Physical Exam  Psychiatric: His speech is normal. Thought content normal. His mood appears anxious. He is withdrawn. Cognition and memory are normal. He expresses impulsivity. He exhibits a depressed mood.    Review of Systems  Constitutional: Positive for fatigue.  HENT: Negative.   Eyes: Negative.   Respiratory: Negative.   Cardiovascular: Negative.   Gastrointestinal: Positive for nausea.  Endocrine: Negative.   Genitourinary: Negative.   Musculoskeletal: Positive for myalgias.  Skin: Negative.   Allergic/Immunologic: Negative.   Neurological: Positive for tremors.  Psychiatric/Behavioral: The patient is nervous/anxious.     Blood pressure (!) 166/97, pulse 67, temperature 98 F (36.7 C), temperature source Oral, resp. rate 18, height 6\' 3"  (1.905 m), weight 91.1 kg, SpO2 98 %.Body mass index is 25.1 kg/m.  General Appearance: Casual  Eye Contact:  Good  Speech:  Clear and Coherent  Volume:  Decreased  Mood:  Anxious  Affect:  Congruent  Thought Process:  Coherent and Linear  Orientation:  Full (Time, Place, and Person)  Thought Content:  Logical  Suicidal Thoughts:  No  Homicidal Thoughts:  No  Memory:  Immediate;   Good Recent;   Good Remote;   Good  Judgement:  Other:  marginal  Insight:  Fair  Psychomotor Activity:  Psychomotor Retardation  Concentration:  Concentration: Fair and Attention Span: Fair  Recall:  Good  Fund of Knowledge:  Good  Language:  Good  Akathisia:  No  Handed:  Right  AIMS (if indicated):     Assets:  Communication Skills Desire for Improvement Social Support  ADL's:  Intact  Cognition:  WNL  Sleep:   poor     Treatment Plan Summary: [age] year old male with long history of Polysubstance  dependence but drug of choice are Benzodiazepine, Alcohol and IV Heroin/Suboxone. Patient still reports anxiety, worries, depression,nausea, apprehension and mild bilateral tremors. He denies psychosis, delusions or self harming thoughts. Based on my assessment today, patient does not meet criteria for inpatient psychiatric admission but he will benefit from referral for outpatient drugs/alcohol rehab after he is  medically cleared.  Recommendations: -Continue Lorazepam alcohol detox protocol -Continue Clonidine 0.1 mg TID PRN for Opioid detox -Continue Hydroxyzine 25 mg Q6 PRN for anxiety -Add Prozac 10 mg po daily for depression -Add Gabapentin 300 mg twice daily for alcohol withdrawal/anxiety. -Consider social worker consult to facilitate referral to [place], [facility] and or [facility] for drug rehab upon discharge.  Disposition: No evidence of imminent risk to self or others at present.   Patient does not meet criteria for psychiatric inpatient admission. Supportive therapy provided about ongoing stressors. Psychiatric service signing out. Re-consult as needed  This service was provided via telemedicine using a 2-way, interactive audio and video technology.  Names of all persons participating in this telemedicine service and their role in this encounter. Name: Eric Keller Role: Patient  Name: Fuller Mandril Role: RN  Name: Thedore Mins, MD Role: Psychiatrist       Thedore Mins, MD 07/22/2019 12:47 PM

## 2019-07-22 NOTE — Progress Notes (Signed)
PROGRESS NOTE    Eric Keller  GEX:[id number] DOB: [birth year] DOA: 07/19/2019 PCP: Jac Canavan, PA-C     Brief Narrative:  Patient is a 34 year old male with a medical history of alcoholism, benzodiazepine abuse/dependence, and hypertension who presented on 1/28 for tremors and myalgias.  A banana bag was started and CIWA initiated.  No seizure-like activity.  To him before.   New events last 24 hours / Subjective: He reports having nausea but this improved slightly and he was able to tolerate some food intake at lunch time. Denies vomiting. He drinks around 1/5 of liquor daily. Has had tremors, denies hallucinations, has had anxiety. His last drink was around 4 AM on 1/28. Reports that he was taking "street" suboxone pta and would like to be considered to start low dose suboxone, reports that he was on methadone clinic treatment in the past.   Assessment & Plan:   Principal Problem:   Alcohol dependence with uncomplicated withdrawal (HCC)  Appreciate care management team  CIWA  Continue Banana bag 150 mL/hr  Zofran prn and phenergan if Zofran not effective  Active Problems:   Anxiety/Depression  Will need to see psych outpatient, will request eval while here to potentially start suboxone which he reports he was on before.              Appreciate Psychiatry seeing him.     Essential hypertension, benign  Coreg 12.5 mg bid    LFT elevation  Monitor CMP    Substance abuse (HCC)             Needs Social worker eval  DVT prophylaxis: Lovenox Code Status: Full Family Communication: Self Disposition Plan: Patient is coming from home, likely discharge to outpatient rehab  Objective: Vitals:   07/21/19 2026 07/22/19 0434 07/22/19 0934 07/22/19 1659  BP: 140/83 (!) 150/68 (!) 166/97 (!) 155/104  Pulse: 67 61 67 63  Resp: 18 18 18 18   Temp: 98.5 F (36.9 C) 97.9 F (36.6 C) 98 F (36.7 C) 98.3 F (36.8 C)  TempSrc: Oral  Oral Oral  SpO2: 100% 100% 98% 97%    Weight:      Height:        Intake/Output Summary (Last 24 hours) at 07/22/2019 1706 Last data filed at 07/22/2019 1000 Gross per 24 hour  Intake 3550.23 ml  Output 2050 ml  Net 1500.23 ml   Filed Weights   07/19/19 1700 07/19/19 1948  Weight: 91.1 kg 91.1 kg    Examination:  General exam: Appears calm and comfortable  Respiratory system: Respiratory effort normal. No respiratory distress. No conversational dyspnea.  Cardiovascular system: S1 & S2 present, RRR. No pedal edema. Gastrointestinal system: Abdomen is nondistended, soft and mild tenderness to palpation. Normal bowel sounds heard. Central nervous system: Alert and oriented. No focal neurological deficits. Speech clear.  Extremities: Symmetric in appearance  Skin: No rashes, lesions or ulcers on exposed skin  Psychiatry: Mood & affect appropriate.   Data Reviewed: I have personally reviewed following labs and imaging studies  CBC: Recent Labs  Lab 07/19/19 1110 07/19/19 1702 07/20/19 0941 07/21/19 0325 07/22/19 0629  WBC 6.9 5.5 3.8* 4.5 5.0  HGB 20.4* 17.8* 15.0 14.8 14.3  HCT 55.8* 49.2 42.6 40.9 41.4  MCV 93.6 95.2 94.7 94.0 96.7  PLT 240 200 104* 95* 87*   Basic Metabolic Panel: Recent Labs  Lab 07/19/19 1208 07/19/19 1208 07/19/19 1702 07/20/19 0012 07/20/19 0941 07/21/19 0325 07/22/19 0629  NA 135   < >  135 134* 138 137 141  K 4.5   < > 5.7* 4.6 4.1 3.4* 3.7  CL 101   < > 100 104 105 105 108  CO2 16*   < > 19* 19* 24 25 25   GLUCOSE 96   < > 101* 107* 122* 105* 101*  BUN 10   < > 12 9 6  <5* <5*  CREATININE 1.41*   < > 1.59* 1.41* 1.34* 1.08 1.17  CALCIUM 8.1*   < > 8.4* 8.1* 8.5* 8.5* 8.8*  MG 1.9  --  2.0  --   --   --  1.9  PHOS  --   --  2.7  --   --   --   --    < > = values in this interval not displayed.   GFR: Estimated Creatinine Clearance: 105.3 mL/min (by C-G formula based on SCr of 1.17 mg/dL).   Liver Function Tests: Recent Labs  Lab 07/19/19 1702 07/20/19 0012  07/20/19 0941 07/21/19 0325 07/22/19 0629  AST 365* 326* 281* 200* 154*  ALT 183* 164* 163* 139* 129*  ALKPHOS 94 80 80 71 65  BILITOT 3.9* 2.6* 2.3* 2.2* 1.8*  PROT 6.2* 5.1* 5.5* 5.5* 5.4*  ALBUMIN 3.1* 2.7* 2.7* 2.7* 2.7*   Recent Labs  Lab 07/19/19 1422  LIPASE 24   Coagulation Profile: Recent Labs  Lab 07/20/19 0012  INR 1.1    Recent Results (from the past 240 hour(s))  SARS CORONAVIRUS 2 (TAT 6-24 HRS) Nasopharyngeal Nasopharyngeal Swab     Status: None   Collection Time: 07/19/19  4:24 PM   Specimen: Nasopharyngeal Swab  Result Value Ref Range Status   SARS Coronavirus 2 NEGATIVE NEGATIVE Final    Comment: (NOTE) SARS-CoV-2 target nucleic acids are NOT DETECTED. The SARS-CoV-2 RNA is generally detectable in upper and lower respiratory specimens during the acute phase of infection. Negative results do not preclude SARS-CoV-2 infection, do not rule out co-infections with other pathogens, and should not be used as the sole basis for treatment or other patient management decisions. Negative results must be combined with clinical observations, patient history, and epidemiological information. The expected result is Negative. Fact Sheet for Patients: SugarRoll.be Fact Sheet for Healthcare Providers: https://www.woods-mathews.com/ This test is not yet approved or cleared by the [place] FDA and  has been authorized for detection and/or diagnosis of SARS-CoV-2 by FDA under an Emergency Use Authorization (EUA). This EUA will remain  in effect (meaning this test can be used) for the duration of the COVID-19 declaration under Section 56 4(b)(1) of the Act, 21 U.S.C. section 360bbb-3(b)(1), unless the authorization is terminated or revoked sooner. Performed at [facility], [place] [address] [place], [place] [address]       Radiology Studies: No results found.    Scheduled Meds: . carvedilol  12.5 mg Oral BID  WC  . cholecalciferol  1,000 Units Oral Daily  . enoxaparin (LOVENOX) injection  40 mg Subcutaneous Q24H  . FLUoxetine  10 mg Oral Daily  . gabapentin  300 mg Oral BID  . influenza vac split quadrivalent PF  0.5 mL Intramuscular Tomorrow-1000  . LORazepam  0-4 mg Oral Q8H  . pneumococcal 23 valent vaccine  0.5 mL Intramuscular Tomorrow-1000  . thiamine  100 mg Oral Daily   Or  . thiamine  100 mg Intravenous Daily   Continuous Infusions: . banana bag IV 1000 mL 150 mL/hr at 07/22/19 1101     LOS: 3 days  Time spent: 25 minutes   Ky Barban, MD [facility] 07/22/2019, 5:06 PM   Available via Epic secure chat 7am-7pm After these hours, please refer to coverage provider listed on [email or web address]

## 2019-07-23 DIAGNOSIS — I10 Essential (primary) hypertension: Secondary | ICD-10-CM

## 2019-07-23 DIAGNOSIS — F13230 Sedative, hypnotic or anxiolytic dependence with withdrawal, uncomplicated: Secondary | ICD-10-CM

## 2019-07-23 DIAGNOSIS — F1910 Other psychoactive substance abuse, uncomplicated: Secondary | ICD-10-CM

## 2019-07-23 DIAGNOSIS — R7989 Other specified abnormal findings of blood chemistry: Secondary | ICD-10-CM

## 2019-07-23 DIAGNOSIS — F1920 Other psychoactive substance dependence, uncomplicated: Secondary | ICD-10-CM

## 2019-07-23 DIAGNOSIS — F331 Major depressive disorder, recurrent, moderate: Secondary | ICD-10-CM

## 2019-07-23 LAB — COMPREHENSIVE METABOLIC PANEL
ALT: 149 U/L — ABNORMAL HIGH (ref 0–44)
AST: 163 U/L — ABNORMAL HIGH (ref 15–41)
Albumin: 2.9 g/dL — ABNORMAL LOW (ref 3.5–5.0)
Alkaline Phosphatase: 59 U/L (ref 38–126)
Anion gap: 10 (ref 5–15)
BUN: <5 mg/dL — ABNORMAL LOW (ref 6–20)
CO2: 26 mmol/L (ref 22–32)
Calcium: 9.2 mg/dL (ref 8.9–10.3)
Chloride: 107 mmol/L (ref 98–111)
Creatinine, Ser: 1.14 mg/dL (ref 0.61–1.24)
GFR calc Af Amer: >60 mL/min (ref >60)
GFR calc non Af Amer: >60 mL/min (ref >60)
Glucose, Bld: 104 mg/dL — ABNORMAL HIGH (ref 70–99)
Potassium: 3.4 mmol/L — ABNORMAL LOW (ref 3.5–5.1)
Sodium: 143 mmol/L (ref 135–145)
Total Bilirubin: 1.5 mg/dL — ABNORMAL HIGH (ref 0.3–1.2)
Total Protein: 5.9 g/dL — ABNORMAL LOW (ref 6.5–8.1)

## 2019-07-23 LAB — CBC
HCT: 41.9 % (ref 39.0–52.0)
Hemoglobin: 14.4 g/dL (ref 13.0–17.0)
MCH: 33.5 pg (ref 26.0–34.0)
MCHC: 34.4 g/dL (ref 30.0–36.0)
MCV: 97.4 fL (ref 80.0–100.0)
Platelets: 87 10*3/uL — ABNORMAL LOW (ref 150–400)
RBC: 4.3 MIL/uL (ref 4.22–5.81)
RDW: 14.6 % (ref 11.5–15.5)
WBC: 6.1 10*3/uL (ref 4.0–10.5)
nRBC: 0 % (ref 0.0–0.2)

## 2019-07-23 MED ORDER — METOPROLOL TARTRATE 25 MG PO TABS
25.0000 mg | ORAL_TABLET | Freq: Two times a day (BID) | ORAL | Status: DC
  Administered 2019-07-23 – 2019-07-27 (×7): 25 mg via ORAL
  Filled 2019-07-23: qty 1
  Filled 2019-07-23: qty 2
  Filled 2019-07-23 (×6): qty 1

## 2019-07-23 MED ORDER — FLUOXETINE HCL 10 MG PO CAPS
10.0000 mg | ORAL_CAPSULE | Freq: Every day | ORAL | Status: DC
  Administered 2019-07-24 – 2019-07-27 (×4): 10 mg via ORAL
  Filled 2019-07-23 (×4): qty 1

## 2019-07-23 MED ORDER — LORAZEPAM 1 MG PO TABS
1.0000 mg | ORAL_TABLET | Freq: Four times a day (QID) | ORAL | Status: AC | PRN
  Administered 2019-07-23: 2 mg via ORAL
  Administered 2019-07-24: 1 mg via ORAL
  Administered 2019-07-24: 2 mg via ORAL
  Administered 2019-07-25 – 2019-07-26 (×4): 1 mg via ORAL
  Filled 2019-07-23: qty 1
  Filled 2019-07-23: qty 2
  Filled 2019-07-23 (×4): qty 1
  Filled 2019-07-23: qty 2

## 2019-07-23 MED ORDER — POTASSIUM CHLORIDE CRYS ER 20 MEQ PO TBCR
40.0000 meq | EXTENDED_RELEASE_TABLET | Freq: Once | ORAL | Status: AC
  Administered 2019-07-23: 40 meq via ORAL
  Filled 2019-07-23: qty 2

## 2019-07-23 MED ORDER — LORAZEPAM 2 MG/ML IJ SOLN: 1.0000 mg | INTRAMUSCULAR | Status: DC | PRN

## 2019-07-23 MED ORDER — CLONIDINE HCL 0.1 MG PO TABS
0.1000 mg | ORAL_TABLET | Freq: Three times a day (TID) | ORAL | Status: DC
  Administered 2019-07-23 – 2019-07-27 (×11): 0.1 mg via ORAL
  Filled 2019-07-23 (×12): qty 1

## 2019-07-23 MED ORDER — DIPHENOXYLATE-ATROPINE 2.5-0.025 MG PO TABS
1.0000 | ORAL_TABLET | Freq: Once | ORAL | Status: AC
  Administered 2019-07-23: 1 via ORAL
  Filled 2019-07-23: qty 1

## 2019-07-23 MED ORDER — HYDROXYZINE HCL 25 MG PO TABS
50.0000 mg | ORAL_TABLET | Freq: Four times a day (QID) | ORAL | Status: DC | PRN
  Administered 2019-07-23 – 2019-07-27 (×7): 50 mg via ORAL
  Filled 2019-07-23 (×7): qty 2

## 2019-07-23 NOTE — Progress Notes (Signed)
PROGRESS NOTE    Eric Keller   WFU:[id number]  DOB: Sep 18, 1985  DOA: 07/19/2019 PCP: Jac Canavan, PA-C   Brief Narrative:  Eric Keller is a 34 year old male with a medical history of alcoholism, benzodiazepine abuse/dependence, and hypertension who presented on 1/28 for tremors and myalgias.  A banana bag was started and CIWA initiated.  No seizure-like activity.       Subjective: Has diarrhea- multiple episodes since yesterday. Still tremulous and having severe and having severe anxiety especially when he wakes up in the AM. He feels the ongiong    Assessment & Plan:   Principal Problem: Alcohol and Benzodiazepine withdrawl - he continues to be in active withdrawal- cont Ativan per CIWA scale - increase Clonidine to TID routine - increase Hydroxyzine to 50 mg PRN  Active Problems:   Anxiety/  Depression - see above - Prozac and Neurontin started to help with this    Essential hypertension, benign - change Coreg to Metoprolol to help with anxiety and tremors    LFT elevation - improving- due to ETOH abuse  Hypokalemia - due to diarrhea?- replace  Diarrhea - ? Due to anxiety- try Lomotil   Thrombocytopenia - ? Due to ETOH abuse - hold blood thinners- follow   Time spent in minutes: 35 DVT prophylaxis: SCDs- d/c Lovenox due to low platelets Code Status: Full code Family Communication:  Disposition Plan: cont to treat for withdrawl Consultants:   psych Procedures:   none Antimicrobials:  Anti-infectives (From admission, onward)   None       Objective: Vitals:   07/22/19 1659 07/22/19 2033 07/23/19 0446 07/23/19 0913  BP: (!) 155/104 126/78 (!) 152/89 (!) 158/93  Pulse: 63 66 63 64  Resp: 18 18 18 18   Temp: 98.3 F (36.8 C) 97.8 F (36.6 C) 98.7 F (37.1 C) 98.5 F (36.9 C)  TempSrc: Oral     SpO2: 97% 98% 99% 99%  Weight:  91.2 kg    Height:        Intake/Output Summary (Last 24 hours) at 07/23/2019 1539 Last data filed at  07/23/2019 1300 Gross per 24 hour  Intake 1080 ml  Output 300 ml  Net 780 ml   Filed Weights   07/19/19 1700 07/19/19 1948 07/22/19 2033  Weight: 91.1 kg 91.1 kg 91.2 kg    Examination: General exam: Appears uncomfortable  HEENT: PERRLA, oral mucosa moist, no sclera icterus or thrush Respiratory system: Clear to auscultation. Respiratory effort normal. Cardiovascular system: S1 & S2 heard, RRR.   Gastrointestinal system: Abdomen soft, non-tender, nondistended. Normal bowel sounds. Central nervous system: Alert and oriented. No focal neurological deficits. Extremities: No cyanosis, clubbing or edema Skin: No rashes or ulcers Psychiatry:  Flat affect     Data Reviewed: I have personally reviewed following labs and imaging studies  CBC: Recent Labs  Lab 07/19/19 1702 07/20/19 0941 07/21/19 0325 07/22/19 0629 07/23/19 0245  WBC 5.5 3.8* 4.5 5.0 6.1  HGB 17.8* 15.0 14.8 14.3 14.4  HCT 49.2 42.6 40.9 41.4 41.9  MCV 95.2 94.7 94.0 96.7 97.4  PLT 200 104* 95* 87* 87*   Basic Metabolic Panel: Recent Labs  Lab 07/19/19 1208 07/19/19 1208 07/19/19 1702 07/19/19 1702 07/20/19 0012 07/20/19 0941 07/21/19 0325 07/22/19 0629 07/23/19 0245  NA 135   < > 135   < > 134* 138 137 141 143  K 4.5   < > 5.7*   < > 4.6 4.1 3.4* 3.7 3.4*  CL 101   < >  100   < > 104 105 105 108 107  CO2 16*   < > 19*   < > 19* 24 25 25 26   GLUCOSE 96   < > 101*   < > 107* 122* 105* 101* 104*  BUN 10   < > 12   < > 9 6 <5* <5* <5*  CREATININE 1.41*   < > 1.59*   < > 1.41* 1.34* 1.08 1.17 1.14  CALCIUM 8.1*   < > 8.4*   < > 8.1* 8.5* 8.5* 8.8* 9.2  MG 1.9  --  2.0  --   --   --   --  1.9  --   PHOS  --   --  2.7  --   --   --   --   --   --    < > = values in this interval not displayed.   GFR: Estimated Creatinine Clearance: 108.1 mL/min (by C-G formula based on SCr of 1.14 mg/dL). Liver Function Tests: Recent Labs  Lab 07/20/19 0012 07/20/19 0941 07/21/19 0325 07/22/19 0629 07/23/19 0245   AST 326* 281* 200* 154* 163*  ALT 164* 163* 139* 129* 149*  ALKPHOS 80 80 71 65 59  BILITOT 2.6* 2.3* 2.2* 1.8* 1.5*  PROT 5.1* 5.5* 5.5* 5.4* 5.9*  ALBUMIN 2.7* 2.7* 2.7* 2.7* 2.9*   Recent Labs  Lab 07/19/19 1422  LIPASE 24   No results for input(s): AMMONIA in the last 168 hours. Coagulation Profile: Recent Labs  Lab 07/20/19 0012  INR 1.1   Cardiac Enzymes: No results for input(s): CKTOTAL, CKMB, CKMBINDEX, TROPONINI in the last 168 hours. BNP (last 3 results) No results for input(s): PROBNP in the last 8760 hours. HbA1C: No results for input(s): HGBA1C in the last 72 hours. CBG: No results for input(s): GLUCAP in the last 168 hours. Lipid Profile: No results for input(s): CHOL, HDL, LDLCALC, TRIG, CHOLHDL, LDLDIRECT in the last 72 hours. Thyroid Function Tests: No results for input(s): TSH, T4TOTAL, FREET4, T3FREE, THYROIDAB in the last 72 hours. Anemia Panel: No results for input(s): VITAMINB12, FOLATE, FERRITIN, TIBC, IRON, RETICCTPCT in the last 72 hours. Urine analysis:    Component Value Date/Time   COLORURINE YELLOW 07/19/2019 2100   APPEARANCEUR CLEAR 07/19/2019 2100   APPEARANCEUR Clear 12/10/2011 1538   LABSPEC 1.012 07/19/2019 2100   LABSPEC 1.004 12/10/2011 1538   PHURINE 6.0 07/19/2019 2100   GLUCOSEU NEGATIVE 07/19/2019 2100   GLUCOSEU Negative 12/10/2011 1538   HGBUR NEGATIVE 07/19/2019 2100   BILIRUBINUR NEGATIVE 07/19/2019 2100   BILIRUBINUR Negative 12/10/2011 1538   KETONESUR 5 (A) 07/19/2019 2100   PROTEINUR NEGATIVE 07/19/2019 2100   UROBILINOGEN 0.2 12/17/2018 1836   NITRITE NEGATIVE 07/19/2019 2100   LEUKOCYTESUR NEGATIVE 07/19/2019 2100   LEUKOCYTESUR Negative 12/10/2011 1538   Sepsis Labs: @LABRCNTIP (procalcitonin:4,lacticidven:4) ) Recent Results (from the past 240 hour(s))  SARS CORONAVIRUS 2 (TAT 6-24 HRS) Nasopharyngeal Nasopharyngeal Swab     Status: None   Collection Time: 07/19/19  4:24 PM   Specimen: Nasopharyngeal Swab   Result Value Ref Range Status   SARS Coronavirus 2 NEGATIVE NEGATIVE Final    Comment: (NOTE) SARS-CoV-2 target nucleic acids are NOT DETECTED. The SARS-CoV-2 RNA is generally detectable in upper and lower respiratory specimens during the acute phase of infection. Negative results do not preclude SARS-CoV-2 infection, do not rule out co-infections with other pathogens, and should not be used as the sole basis for treatment or other patient management decisions.  Negative results must be combined with clinical observations, patient history, and epidemiological information. The expected result is Negative. Fact Sheet for Patients: SugarRoll.be Fact Sheet for Healthcare Providers: https://www.woods-mathews.com/ This test is not yet approved or cleared by the [place] FDA and  has been authorized for detection and/or diagnosis of SARS-CoV-2 by FDA under an Emergency Use Authorization (EUA). This EUA will remain  in effect (meaning this test can be used) for the duration of the COVID-19 declaration under Section 56 4(b)(1) of the Act, 21 U.S.C. section 360bbb-3(b)(1), unless the authorization is terminated or revoked sooner. Performed at [facility], [place] [address], [place], [place] [address]          Radiology Studies: No results found.    Scheduled Meds: . carvedilol  12.5 mg Oral BID WC  . cholecalciferol  1,000 Units Oral Daily  . FLUoxetine  10 mg Oral Daily  . gabapentin  300 mg Oral BID  . LORazepam  0-4 mg Oral Q8H  . pneumococcal 23 valent vaccine  0.5 mL Intramuscular Tomorrow-1000  . thiamine  100 mg Oral Daily   Or  . thiamine  100 mg Intravenous Daily   Continuous Infusions:   LOS: 4 days      Debbe Odea, MD [facility] Pager: [email or web address] Password [facility] 07/23/2019, 3:39 PM

## 2019-07-24 LAB — COMPREHENSIVE METABOLIC PANEL
ALT: 154 U/L — ABNORMAL HIGH (ref 0–44)
AST: 124 U/L — ABNORMAL HIGH (ref 15–41)
Albumin: 3.1 g/dL — ABNORMAL LOW (ref 3.5–5.0)
Alkaline Phosphatase: 61 U/L (ref 38–126)
Anion gap: 6 (ref 5–15)
BUN: 6 mg/dL (ref 6–20)
CO2: 27 mmol/L (ref 22–32)
Calcium: 9.2 mg/dL (ref 8.9–10.3)
Chloride: 106 mmol/L (ref 98–111)
Creatinine, Ser: 1.27 mg/dL — ABNORMAL HIGH (ref 0.61–1.24)
GFR calc Af Amer: >60 mL/min (ref >60)
GFR calc non Af Amer: >60 mL/min (ref >60)
Glucose, Bld: 113 mg/dL — ABNORMAL HIGH (ref 70–99)
Potassium: 4.1 mmol/L (ref 3.5–5.1)
Sodium: 139 mmol/L (ref 135–145)
Total Bilirubin: 1.5 mg/dL — ABNORMAL HIGH (ref 0.3–1.2)
Total Protein: 6.5 g/dL (ref 6.5–8.1)

## 2019-07-24 LAB — CBC
HCT: 45.3 % (ref 39.0–52.0)
Hemoglobin: 15.1 g/dL (ref 13.0–17.0)
MCH: 33.3 pg (ref 26.0–34.0)
MCHC: 33.3 g/dL (ref 30.0–36.0)
MCV: 99.8 fL (ref 80.0–100.0)
Platelets: 102 10*3/uL — ABNORMAL LOW (ref 150–400)
RBC: 4.54 MIL/uL (ref 4.22–5.81)
RDW: 14.5 % (ref 11.5–15.5)
WBC: 7.7 10*3/uL (ref 4.0–10.5)
nRBC: 0 % (ref 0.0–0.2)

## 2019-07-24 MED ORDER — TRAZODONE HCL 100 MG PO TABS
100.0000 mg | ORAL_TABLET | Freq: Every day | ORAL | Status: DC
  Administered 2019-07-24 – 2019-07-26 (×3): 100 mg via ORAL
  Filled 2019-07-24 (×4): qty 1

## 2019-07-24 NOTE — Plan of Care (Signed)
  Problem: Education: Goal: Knowledge of disease or condition will improve Outcome: Progressing   Problem: Activity: Goal: Risk for activity intolerance will decrease Outcome: Progressing   

## 2019-07-24 NOTE — Progress Notes (Signed)
PROGRESS NOTE    Eric Keller   ZOX:[id number]  DOB: [birth year]  DOA: 07/19/2019 PCP: Jac Canavan, PA-C   Brief Narrative:  Eric Keller is a 34 year old male with a medical history of alcoholism, benzodiazepine abuse/dependence, and hypertension who presented on 1/28 for tremors and myalgias. He was noted to be in ETOH withdrawal.  A banana bag was started and CIWA initiated.  No seizure-like activity.       Subjective: He is still feeling very anxious. Hands still shaking and he feels his heart is racing. Not sleeping much at night.     Assessment & Plan:   Principal Problem: Alcohol and Benzodiazepine withdrawal - he has been drinking daily and using multiple milligrams (up to 6 mg a day) of (non prescribed) Xanax daily for about 1 yr now - he continues to be in active withdrawal- cont Ativan per CIWA scale - increased Clonidine to TID routine- He might need a prescription for this when discharged - increased Hydroxyzine to 50 mg PRN - add Trazodone to help him sleep tonight so he does not need to use Ativan - he will call today to see if he can get into a residential rehabilitation program  Active Problems:   Anxiety/  Depression - see above - his anxiety is more severe than his depression and he tends to have panic attacks - Prozac and Neurontin started to help with this- will need outpt follow up with psych    Essential hypertension, benign - changed Coreg to Metoprolol to help with anxiety and tremors    LFT elevation - improving- due to ETOH abuse  Hypokalemia - due to diarrhea?- replace  Diarrhea - ? Due to anxiety- cont PRN Lomotil   Thrombocytopenia - ? Due to ETOH abuse - hold blood thinners- follow   Time spent in minutes: 35 DVT prophylaxis: SCDs- d/c Lovenox due to low platelets Code Status: Full code Family Communication:  Disposition Plan: cont to treat for withdrawl Consultants:   psych Procedures:   none Antimicrobials:   Anti-infectives (From admission, onward)   None       Objective: Vitals:   07/24/19 0523 07/24/19 0847 07/24/19 1000 07/24/19 1300  BP: (!) 138/100 (!) 153/115 (!) 148/108 (!) 143/94  Pulse: 75 70  73  Resp: 18 18    Temp: 99.1 F (37.3 C) 98.4 F (36.9 C)  99 F (37.2 C)  TempSrc: Oral Oral  Oral  SpO2: 98% 98%  98%  Weight:      Height:        Intake/Output Summary (Last 24 hours) at 07/24/2019 1538 Last data filed at 07/24/2019 1400 Gross per 24 hour  Intake 1620 ml  Output 0 ml  Net 1620 ml   Filed Weights   07/19/19 1700 07/19/19 1948 07/22/19 2033  Weight: 91.1 kg 91.1 kg 91.2 kg    Examination: General exam: Appears comfortable  HEENT: PERRLA, oral mucosa moist, no sclera icterus or thrush Respiratory system: Clear to auscultation. Respiratory effort normal. Cardiovascular system: S1 & S2 heard,  No murmurs  Gastrointestinal system: Abdomen soft, non-tender, nondistended. Normal bowel sounds   Central nervous system: Alert and oriented. No focal neurological deficits. Extremities: No cyanosis, clubbing or edema Skin: No rashes or ulcers Psychiatry:  anxious and depressed    Data Reviewed: I have personally reviewed following labs and imaging studies  CBC: Recent Labs  Lab 07/20/19 0941 07/21/19 0325 07/22/19 0629 07/23/19 0245 07/24/19 0527  WBC 3.8* 4.5 5.0 6.1 7.7  HGB 15.0 14.8 14.3 14.4 15.1  HCT 42.6 40.9 41.4 41.9 45.3  MCV 94.7 94.0 96.7 97.4 99.8  PLT 104* 95* 87* 87* 627*   Basic Metabolic Panel: Recent Labs  Lab 07/19/19 1208 07/19/19 1208 07/19/19 1702 07/20/19 0012 07/20/19 0941 07/21/19 0325 07/22/19 0629 07/23/19 0245 07/24/19 0527  NA 135   < > 135   < > 138 137 141 143 139  K 4.5   < > 5.7*   < > 4.1 3.4* 3.7 3.4* 4.1  CL 101   < > 100   < > 105 105 108 107 106  CO2 16*   < > 19*   < > 24 25 25 26 27   GLUCOSE 96   < > 101*   < > 122* 105* 101* 104* 113*  BUN 10   < > 12   < > 6 <5* <5* <5* 6  CREATININE 1.41*   < >  1.59*   < > 1.34* 1.08 1.17 1.14 1.27*  CALCIUM 8.1*   < > 8.4*   < > 8.5* 8.5* 8.8* 9.2 9.2  MG 1.9  --  2.0  --   --   --  1.9  --   --   PHOS  --   --  2.7  --   --   --   --   --   --    < > = values in this interval not displayed.   GFR: Estimated Creatinine Clearance: 97 mL/min (A) (by C-G formula based on SCr of 1.27 mg/dL (H)). Liver Function Tests: Recent Labs  Lab 07/20/19 0941 07/21/19 0325 07/22/19 0629 07/23/19 0245 07/24/19 0527  AST 281* 200* 154* 163* 124*  ALT 163* 139* 129* 149* 154*  ALKPHOS 80 71 65 59 61  BILITOT 2.3* 2.2* 1.8* 1.5* 1.5*  PROT 5.5* 5.5* 5.4* 5.9* 6.5  ALBUMIN 2.7* 2.7* 2.7* 2.9* 3.1*   Recent Labs  Lab 07/19/19 1422  LIPASE 24   No results for input(s): AMMONIA in the last 168 hours. Coagulation Profile: Recent Labs  Lab 07/20/19 0012  INR 1.1   Cardiac Enzymes: No results for input(s): CKTOTAL, CKMB, CKMBINDEX, TROPONINI in the last 168 hours. BNP (last 3 results) No results for input(s): PROBNP in the last 8760 hours. HbA1C: No results for input(s): HGBA1C in the last 72 hours. CBG: No results for input(s): GLUCAP in the last 168 hours. Lipid Profile: No results for input(s): CHOL, HDL, LDLCALC, TRIG, CHOLHDL, LDLDIRECT in the last 72 hours. Thyroid Function Tests: No results for input(s): TSH, T4TOTAL, FREET4, T3FREE, THYROIDAB in the last 72 hours. Anemia Panel: No results for input(s): VITAMINB12, FOLATE, FERRITIN, TIBC, IRON, RETICCTPCT in the last 72 hours. Urine analysis:    Component Value Date/Time   COLORURINE YELLOW 07/19/2019 2100   APPEARANCEUR CLEAR 07/19/2019 2100   APPEARANCEUR Clear 12/10/2011 1538   LABSPEC 1.012 07/19/2019 2100   LABSPEC 1.004 12/10/2011 1538   PHURINE 6.0 07/19/2019 2100   GLUCOSEU NEGATIVE 07/19/2019 2100   GLUCOSEU Negative 12/10/2011 1538   HGBUR NEGATIVE 07/19/2019 2100   BILIRUBINUR NEGATIVE 07/19/2019 2100   BILIRUBINUR Negative 12/10/2011 1538   KETONESUR 5 (A) 07/19/2019  2100   PROTEINUR NEGATIVE 07/19/2019 2100   UROBILINOGEN 0.2 12/17/2018 1836   NITRITE NEGATIVE 07/19/2019 2100   LEUKOCYTESUR NEGATIVE 07/19/2019 2100   LEUKOCYTESUR Negative 12/10/2011 1538   Sepsis Labs: @LABRCNTIP (procalcitonin:4,lacticidven:4) ) Recent Results (from the past 240 hour(s))  SARS CORONAVIRUS 2 (TAT 6-24 HRS)  Nasopharyngeal Nasopharyngeal Swab     Status: None   Collection Time: 07/19/19  4:24 PM   Specimen: Nasopharyngeal Swab  Result Value Ref Range Status   SARS Coronavirus 2 NEGATIVE NEGATIVE Final    Comment: (NOTE) SARS-CoV-2 target nucleic acids are NOT DETECTED. The SARS-CoV-2 RNA is generally detectable in upper and lower respiratory specimens during the acute phase of infection. Negative results do not preclude SARS-CoV-2 infection, do not rule out co-infections with other pathogens, and should not be used as the sole basis for treatment or other patient management decisions. Negative results must be combined with clinical observations, patient history, and epidemiological information. The expected result is Negative. Fact Sheet for Patients: HairSlick.no Fact Sheet for Healthcare Providers: quierodirigir.com This test is not yet approved or cleared by the [place] FDA and  has been authorized for detection and/or diagnosis of SARS-CoV-2 by FDA under an Emergency Use Authorization (EUA). This EUA will remain  in effect (meaning this test can be used) for the duration of the COVID-19 declaration under Section 56 4(b)(1) of the Act, 21 U.S.C. section 360bbb-3(b)(1), unless the authorization is terminated or revoked sooner. Performed at [facility], [address], [place], [place] [address]          Radiology Studies: No results found.    Scheduled Meds: . cholecalciferol  1,000 Units Oral Daily  . cloNIDine  0.1 mg Oral TID  . FLUoxetine  10 mg Oral Daily  . gabapentin   300 mg Oral BID  . metoprolol tartrate  25 mg Oral BID  . pneumococcal 23 valent vaccine  0.5 mL Intramuscular Tomorrow-1000  . thiamine  100 mg Oral Daily   Or  . thiamine  100 mg Intravenous Daily   Continuous Infusions:   LOS: 5 days      Calvert Cantor, MD [facility] Pager: [email or web address] Password [facility] 07/24/2019, 3:38 PM

## 2019-07-25 LAB — COMPREHENSIVE METABOLIC PANEL
ALT: 149 U/L — ABNORMAL HIGH (ref 0–44)
AST: 104 U/L — ABNORMAL HIGH (ref 15–41)
Albumin: 3.3 g/dL — ABNORMAL LOW (ref 3.5–5.0)
Alkaline Phosphatase: 50 U/L (ref 38–126)
Anion gap: 12 (ref 5–15)
BUN: 7 mg/dL (ref 6–20)
CO2: 23 mmol/L (ref 22–32)
Calcium: 9.5 mg/dL (ref 8.9–10.3)
Chloride: 103 mmol/L (ref 98–111)
Creatinine, Ser: 1.05 mg/dL (ref 0.61–1.24)
GFR calc Af Amer: >60 mL/min (ref >60)
GFR calc non Af Amer: >60 mL/min (ref >60)
Glucose, Bld: 117 mg/dL — ABNORMAL HIGH (ref 70–99)
Potassium: 3.7 mmol/L (ref 3.5–5.1)
Sodium: 138 mmol/L (ref 135–145)
Total Bilirubin: 1.5 mg/dL — ABNORMAL HIGH (ref 0.3–1.2)
Total Protein: 6.4 g/dL — ABNORMAL LOW (ref 6.5–8.1)

## 2019-07-25 LAB — CBC
HCT: 42.7 % (ref 39.0–52.0)
Hemoglobin: 14.6 g/dL (ref 13.0–17.0)
MCH: 33.2 pg (ref 26.0–34.0)
MCHC: 34.2 g/dL (ref 30.0–36.0)
MCV: 97 fL (ref 80.0–100.0)
Platelets: 112 10*3/uL — ABNORMAL LOW (ref 150–400)
RBC: 4.4 MIL/uL (ref 4.22–5.81)
RDW: 13.9 % (ref 11.5–15.5)
WBC: 7.3 10*3/uL (ref 4.0–10.5)
nRBC: 0 % (ref 0.0–0.2)

## 2019-07-25 MED ORDER — KCL IN DEXTROSE-NACL 20-5-0.9 MEQ/L-%-% IV SOLN
INTRAVENOUS | Status: DC
  Filled 2019-07-25 (×3): qty 1000

## 2019-07-25 MED ORDER — ACETAMINOPHEN 325 MG PO TABS
650.0000 mg | ORAL_TABLET | Freq: Four times a day (QID) | ORAL | Status: DC | PRN
  Administered 2019-07-25: 650 mg via ORAL
  Filled 2019-07-25: qty 2

## 2019-07-25 NOTE — Plan of Care (Signed)
  Problem: Education: Goal: Knowledge of disease or condition will improve Outcome: Progressing   

## 2019-07-25 NOTE — Progress Notes (Addendum)
PROGRESS NOTE    Eric Keller   CVE:[id number]  DOB: 04-22-1986  DOA: 07/19/2019 PCP: Jac Canavan, PA-C   Brief Narrative:  Eric Keller is a 34 year old male with a medical history of alcoholism, benzodiazepine abuse/dependence, and hypertension who presented on 1/28 for tremors and myalgias. He was noted to be in ETOH withdrawal.  A banana bag was started and CIWA initiated.  No seizure-like activity.       Subjective: Extensive vomiting last night. Very nauseated today. Not able to eat or drink. Did not sleep much.     Assessment & Plan:   Principal Problem: Alcohol and Benzodiazepine withdrawal - he has been drinking daily and using multiple milligrams (up to 6 mg a day) of (non prescribed) Xanax daily for about 1 yr now - he continues to be in active withdrawal- cont Ativan per CIWA scale - increased Clonidine to TID routine- He states this helps a great deal - increased Hydroxyzine to 50 mg PRN - added Trazodone   Active Problems:  Vomiting and nausea- not eating but able to keep meds down so far  > start IVF today - PRN Zofran and Phenergan   Anxiety/  Depression - see above - his anxiety is more severe than his depression and he tends to have panic attacks - Prozac, Hydroxyzine and Neurontin started to help with this- will need outpt follow up with psych    Essential hypertension, benign - changed Coreg to Metoprolol to help with anxiety and tremors    LFT elevation - improving- due to ETOH abuse  Hypokalemia - due to diarrhea?- replace  Diarrhea - ? Due to anxiety- cont PRN Lomotil   Thrombocytopenia - ? Due to ETOH abuse - hold blood thinners- follow   Time spent in minutes: 35 DVT prophylaxis: SCDs- d/c Lovenox due to low platelets Code Status: Full code Family Communication:  Disposition Plan: cont to treat for withdrawal- now has vomiting and needs IVF as well Consultants:   psych Procedures:   none Antimicrobials:    Anti-infectives (From admission, onward)   None       Objective: Vitals:   07/24/19 2254 07/25/19 0638 07/25/19 0800 07/25/19 0929  BP: 136/85 138/84 134/84 (!) 157/94  Pulse: 62 63 65 (!) 59  Resp: 18 18 18 18   Temp: 98.9 F (37.2 C) 98.6 F (37 C) 98.7 F (37.1 C) 98.9 F (37.2 C)  TempSrc: Oral Oral Oral Oral  SpO2: 98% 96% 98% 98%  Weight:      Height:        Intake/Output Summary (Last 24 hours) at 07/25/2019 1044 Last data filed at 07/25/2019 0600 Gross per 24 hour  Intake 840 ml  Output 0 ml  Net 840 ml   Filed Weights   07/19/19 1700 07/19/19 1948 07/22/19 2033  Weight: 91.1 kg 91.1 kg 91.2 kg    Examination: General exam: Appears comfortable  HEENT: PERRLA, oral mucosa moist, no sclera icterus or thrush Respiratory system: Clear to auscultation. Respiratory effort normal. Cardiovascular system: S1 & S2 heard,  No murmurs  Gastrointestinal system: Abdomen soft, tender in upper mid abdomen, nondistended. Normal bowel sounds   Central nervous system: Alert and oriented. No focal neurological deficits. Extremities: No cyanosis, clubbing or edema Skin: No rashes or ulcers Psychiatry:  Mood & affect appropriate.     Data Reviewed: I have personally reviewed following labs and imaging studies  CBC: Recent Labs  Lab 07/21/19 0325 07/22/19 07/24/19 07/23/19 0245 07/24/19 0527 07/25/19 0501  WBC 4.5 5.0 6.1 7.7 7.3  HGB 14.8 14.3 14.4 15.1 14.6  HCT 40.9 41.4 41.9 45.3 42.7  MCV 94.0 96.7 97.4 99.8 97.0  PLT 95* 87* 87* 102* 112*   Basic Metabolic Panel: Recent Labs  Lab 07/19/19 1208 07/19/19 1208 07/19/19 1702 07/20/19 0012 07/21/19 0325 07/22/19 0629 07/23/19 0245 07/24/19 0527 07/25/19 0501  NA 135   < > 135   < > 137 141 143 139 138  K 4.5   < > 5.7*   < > 3.4* 3.7 3.4* 4.1 3.7  CL 101   < > 100   < > 105 108 107 106 103  CO2 16*   < > 19*   < > 25 25 26 27 23   GLUCOSE 96   < > 101*   < > 105* 101* 104* 113* 117*  BUN 10   < > 12   < >  <5* <5* <5* 6 7  CREATININE 1.41*   < > 1.59*   < > 1.08 1.17 1.14 1.27* 1.05  CALCIUM 8.1*   < > 8.4*   < > 8.5* 8.8* 9.2 9.2 9.5  MG 1.9  --  2.0  --   --  1.9  --   --   --   PHOS  --   --  2.7  --   --   --   --   --   --    < > = values in this interval not displayed.   GFR: Estimated Creatinine Clearance: 117.4 mL/min (by C-G formula based on SCr of 1.05 mg/dL). Liver Function Tests: Recent Labs  Lab 07/21/19 0325 07/22/19 0629 07/23/19 0245 07/24/19 0527 07/25/19 0501  AST 200* 154* 163* 124* 104*  ALT 139* 129* 149* 154* 149*  ALKPHOS 71 65 59 61 50  BILITOT 2.2* 1.8* 1.5* 1.5* 1.5*  PROT 5.5* 5.4* 5.9* 6.5 6.4*  ALBUMIN 2.7* 2.7* 2.9* 3.1* 3.3*   Recent Labs  Lab 07/19/19 1422  LIPASE 24   No results for input(s): AMMONIA in the last 168 hours. Coagulation Profile: Recent Labs  Lab 07/20/19 0012  INR 1.1   Cardiac Enzymes: No results for input(s): CKTOTAL, CKMB, CKMBINDEX, TROPONINI in the last 168 hours. BNP (last 3 results) No results for input(s): PROBNP in the last 8760 hours. HbA1C: No results for input(s): HGBA1C in the last 72 hours. CBG: No results for input(s): GLUCAP in the last 168 hours. Lipid Profile: No results for input(s): CHOL, HDL, LDLCALC, TRIG, CHOLHDL, LDLDIRECT in the last 72 hours. Thyroid Function Tests: No results for input(s): TSH, T4TOTAL, FREET4, T3FREE, THYROIDAB in the last 72 hours. Anemia Panel: No results for input(s): VITAMINB12, FOLATE, FERRITIN, TIBC, IRON, RETICCTPCT in the last 72 hours. Urine analysis:    Component Value Date/Time   COLORURINE YELLOW 07/19/2019 2100   APPEARANCEUR CLEAR 07/19/2019 2100   APPEARANCEUR Clear 12/10/2011 1538   LABSPEC 1.012 07/19/2019 2100   LABSPEC 1.004 12/10/2011 1538   PHURINE 6.0 07/19/2019 2100   GLUCOSEU NEGATIVE 07/19/2019 2100   GLUCOSEU Negative 12/10/2011 1538   HGBUR NEGATIVE 07/19/2019 2100   BILIRUBINUR NEGATIVE 07/19/2019 2100   BILIRUBINUR Negative 12/10/2011  1538   KETONESUR 5 (A) 07/19/2019 2100   PROTEINUR NEGATIVE 07/19/2019 2100   UROBILINOGEN 0.2 12/17/2018 1836   NITRITE NEGATIVE 07/19/2019 2100   LEUKOCYTESUR NEGATIVE 07/19/2019 2100   LEUKOCYTESUR Negative 12/10/2011 1538   Sepsis Labs: @LABRCNTIP (procalcitonin:4,lacticidven:4) ) Recent Results (from the past 240 hour(s))  SARS  CORONAVIRUS 2 (TAT 6-24 HRS) Nasopharyngeal Nasopharyngeal Swab     Status: None   Collection Time: 07/19/19  4:24 PM   Specimen: Nasopharyngeal Swab  Result Value Ref Range Status   SARS Coronavirus 2 NEGATIVE NEGATIVE Final    Comment: (NOTE) SARS-CoV-2 target nucleic acids are NOT DETECTED. The SARS-CoV-2 RNA is generally detectable in upper and lower respiratory specimens during the acute phase of infection. Negative results do not preclude SARS-CoV-2 infection, do not rule out co-infections with other pathogens, and should not be used as the sole basis for treatment or other patient management decisions. Negative results must be combined with clinical observations, patient history, and epidemiological information. The expected result is Negative. Fact Sheet for Patients: SugarRoll.be Fact Sheet for Healthcare Providers: https://www.woods-mathews.com/ This test is not yet approved or cleared by the [place] FDA and  has been authorized for detection and/or diagnosis of SARS-CoV-2 by FDA under an Emergency Use Authorization (EUA). This EUA will remain  in effect (meaning this test can be used) for the duration of the COVID-19 declaration under Section 56 4(b)(1) of the Act, 21 U.S.C. section 360bbb-3(b)(1), unless the authorization is terminated or revoked sooner. Performed at [facility], [place] [address] [place], [place] [address]          Radiology Studies: No results found.    Scheduled Meds: . cholecalciferol  1,000 Units Oral Daily  . cloNIDine  0.1 mg Oral TID  . FLUoxetine   10 mg Oral Daily  . gabapentin  300 mg Oral BID  . metoprolol tartrate  25 mg Oral BID  . pneumococcal 23 valent vaccine  0.5 mL Intramuscular Tomorrow-1000  . thiamine  100 mg Oral Daily   Or  . thiamine  100 mg Intravenous Daily  . traZODone  100 mg Oral QHS   Continuous Infusions: . dextrose 5 % and 0.9 % NaCl with KCl 20 mEq/L       LOS: 6 days      Debbe Odea, MD [facility] Pager: [email or web address] Password [facility] 07/25/2019, 10:44 AM

## 2019-07-26 LAB — BASIC METABOLIC PANEL
Anion gap: 9 (ref 5–15)
BUN: 7 mg/dL (ref 6–20)
CO2: 24 mmol/L (ref 22–32)
Calcium: 9 mg/dL (ref 8.9–10.3)
Chloride: 106 mmol/L (ref 98–111)
Creatinine, Ser: 1.25 mg/dL — ABNORMAL HIGH (ref 0.61–1.24)
GFR calc Af Amer: >60 mL/min (ref >60)
GFR calc non Af Amer: >60 mL/min (ref >60)
Glucose, Bld: 146 mg/dL — ABNORMAL HIGH (ref 70–99)
Potassium: 4.5 mmol/L (ref 3.5–5.1)
Sodium: 139 mmol/L (ref 135–145)

## 2019-07-26 LAB — CBC
HCT: 40.4 % (ref 39.0–52.0)
Hemoglobin: 13.9 g/dL (ref 13.0–17.0)
MCH: 33.7 pg (ref 26.0–34.0)
MCHC: 34.4 g/dL (ref 30.0–36.0)
MCV: 98.1 fL (ref 80.0–100.0)
Platelets: 115 10*3/uL — ABNORMAL LOW (ref 150–400)
RBC: 4.12 MIL/uL — ABNORMAL LOW (ref 4.22–5.81)
RDW: 13.9 % (ref 11.5–15.5)
WBC: 6.3 10*3/uL (ref 4.0–10.5)
nRBC: 0 % (ref 0.0–0.2)

## 2019-07-26 MED ORDER — BUSPIRONE HCL 5 MG PO TABS
7.5000 mg | ORAL_TABLET | Freq: Three times a day (TID) | ORAL | Status: DC
  Administered 2019-07-26 – 2019-07-27 (×4): 7.5 mg via ORAL
  Filled 2019-07-26 (×5): qty 2

## 2019-07-26 MED ORDER — GABAPENTIN 300 MG PO CAPS
300.0000 mg | ORAL_CAPSULE | Freq: Three times a day (TID) | ORAL | Status: DC
  Administered 2019-07-26 – 2019-07-27 (×4): 300 mg via ORAL
  Filled 2019-07-26 (×4): qty 1

## 2019-07-26 NOTE — Progress Notes (Addendum)
PROGRESS NOTE    Eric Keller   SJG:[id number]  DOB: [birth year]  DOA: 07/19/2019 PCP: Jac Canavan, PA-C   Brief Narrative:  Eric Keller is a 34 year old male with a medical history of alcoholism, benzodiazepine abuse/dependence, and hypertension who presented on 1/28 for tremors and myalgias. He was noted to be in ETOH withdrawal.  A banana bag was started and CIWA initiated.  No seizure-like activity.       Subjective: Less nausea today. Anxiety is still very severe. Slept about 4 hrs last night.     Assessment & Plan:   Principal Problem: Alcohol and Benzodiazepine withdrawal - he has been drinking daily and using multiple milligrams (up to 6 mg a day) of (non prescribed) Xanax daily for about 1 yr now - he continues to be in active withdrawal- cont Ativan per CIWA scale - increased Clonidine to TID routine- He states this helps a great deal - increased Hydroxyzine to 50 mg PRN - added Trazodone   Active Problems:  Vomiting and nausea - appears to be improving- if he is eating, drinking today, I have told RN to d/c IVF around lunch time - PRN Zofran and Phenergan   Anxiety/  Depression - see above - his anxiety is more severe than his depression and he tends to have panic attacks - Prozac, Hydroxyzine and Neurontin started to help with this- will need outpt follow up with psych - 2/4> increase Neurontin to TID and add Buspar 7.5 TID for continued severe anxiety    Essential hypertension, benign - changed Coreg to Metoprolol to help with anxiety and tremors    LFT elevation - improving- due to ETOH abuse  Hypokalemia - due to diarrhea?- replaced  Diarrhea - ? Due to anxiety- cont PRN Lomotil   Thrombocytopenia - ? Due to ETOH abuse - hold blood thinners- follow   Time spent in minutes: 35 DVT prophylaxis: SCDs- d/c' Lovenox due to low platelets- he is now more ambulatory Code Status: Full code Family Communication:  Disposition Plan:  From  home - cont to treat for withdrawal - adding medication for his severe anxiety- hoping for d/c tomorrow to residential rehab ([facility])  Consultants:   psych Procedures:   none Antimicrobials:  Anti-infectives (From admission, onward)   None       Objective: Vitals:   07/25/19 1713 07/25/19 2131 07/26/19 0425 07/26/19 0940  BP: (!) 159/102 129/80 116/90 (!) 153/90  Pulse: 70 61 61 68  Resp: 18 16 14 18   Temp: 99.3 F (37.4 C) 98.7 F (37.1 C) 98.6 F (37 C) 98.9 F (37.2 C)  TempSrc: Oral Oral Oral Oral  SpO2: 98% 99% 99% 98%  Weight:  90.8 kg    Height:        Intake/Output Summary (Last 24 hours) at 07/26/2019 1030 Last data filed at 07/26/2019 0951 Gross per 24 hour  Intake 2604.62 ml  Output --  Net 2604.62 ml   Filed Weights   07/19/19 1948 07/22/19 2033 07/25/19 2131  Weight: 91.1 kg 91.2 kg 90.8 kg    Examination: General exam: Appears comfortable  HEENT: PERRLA, oral mucosa moist, no sclera icterus or thrush Respiratory system: Clear to auscultation. Respiratory effort normal. Cardiovascular system: S1 & S2 heard,  No murmurs  Gastrointestinal system: Abdomen soft, non-tender, nondistended. Normal bowel sounds   Central nervous system: Alert and oriented. No focal neurological deficits. Extremities: No cyanosis, clubbing or edema Skin: No rashes or ulcers Psychiatry:  flat affect   Data  Reviewed: I have personally reviewed following labs and imaging studies  CBC: Recent Labs  Lab 07/22/19 0629 07/23/19 0245 07/24/19 0527 07/25/19 0501 07/26/19 0750  WBC 5.0 6.1 7.7 7.3 6.3  HGB 14.3 14.4 15.1 14.6 13.9  HCT 41.4 41.9 45.3 42.7 40.4  MCV 96.7 97.4 99.8 97.0 98.1  PLT 87* 87* 102* 112* 160*   Basic Metabolic Panel: Recent Labs  Lab 07/19/19 1208 07/19/19 1208 07/19/19 1702 07/20/19 0012 07/22/19 0629 07/23/19 0245 07/24/19 0527 07/25/19 0501 07/26/19 0750  NA 135   < > 135   < > 141 143 139 138 139  K 4.5   < > 5.7*   < > 3.7 3.4* 4.1  3.7 4.5  CL 101   < > 100   < > 108 107 106 103 106  CO2 16*   < > 19*   < > 25 26 27 23 24   GLUCOSE 96   < > 101*   < > 101* 104* 113* 117* 146*  BUN 10   < > 12   < > <5* <5* 6 7 7   CREATININE 1.41*   < > 1.59*   < > 1.17 1.14 1.27* 1.05 1.25*  CALCIUM 8.1*   < > 8.4*   < > 8.8* 9.2 9.2 9.5 9.0  MG 1.9  --  2.0  --  1.9  --   --   --   --   PHOS  --   --  2.7  --   --   --   --   --   --    < > = values in this interval not displayed.   GFR: Estimated Creatinine Clearance: 98.6 mL/min (A) (by C-G formula based on SCr of 1.25 mg/dL (H)). Liver Function Tests: Recent Labs  Lab 07/21/19 0325 07/22/19 0629 07/23/19 0245 07/24/19 0527 07/25/19 0501  AST 200* 154* 163* 124* 104*  ALT 139* 129* 149* 154* 149*  ALKPHOS 71 65 59 61 50  BILITOT 2.2* 1.8* 1.5* 1.5* 1.5*  PROT 5.5* 5.4* 5.9* 6.5 6.4*  ALBUMIN 2.7* 2.7* 2.9* 3.1* 3.3*   Recent Labs  Lab 07/19/19 1422  LIPASE 24   No results for input(s): AMMONIA in the last 168 hours. Coagulation Profile: Recent Labs  Lab 07/20/19 0012  INR 1.1   Cardiac Enzymes: No results for input(s): CKTOTAL, CKMB, CKMBINDEX, TROPONINI in the last 168 hours. BNP (last 3 results) No results for input(s): PROBNP in the last 8760 hours. HbA1C: No results for input(s): HGBA1C in the last 72 hours. CBG: No results for input(s): GLUCAP in the last 168 hours. Lipid Profile: No results for input(s): CHOL, HDL, LDLCALC, TRIG, CHOLHDL, LDLDIRECT in the last 72 hours. Thyroid Function Tests: No results for input(s): TSH, T4TOTAL, FREET4, T3FREE, THYROIDAB in the last 72 hours. Anemia Panel: No results for input(s): VITAMINB12, FOLATE, FERRITIN, TIBC, IRON, RETICCTPCT in the last 72 hours. Urine analysis:    Component Value Date/Time   COLORURINE YELLOW 07/19/2019 2100   APPEARANCEUR CLEAR 07/19/2019 2100   APPEARANCEUR Clear 12/10/2011 1538   LABSPEC 1.012 07/19/2019 2100   LABSPEC 1.004 12/10/2011 1538   PHURINE 6.0 07/19/2019 2100    GLUCOSEU NEGATIVE 07/19/2019 2100   GLUCOSEU Negative 12/10/2011 1538   HGBUR NEGATIVE 07/19/2019 2100   BILIRUBINUR NEGATIVE 07/19/2019 2100   BILIRUBINUR Negative 12/10/2011 1538   KETONESUR 5 (A) 07/19/2019 2100   PROTEINUR NEGATIVE 07/19/2019 2100   UROBILINOGEN 0.2 12/17/2018 1836   NITRITE NEGATIVE  07/19/2019 2100   LEUKOCYTESUR NEGATIVE 07/19/2019 2100   LEUKOCYTESUR Negative 12/10/2011 1538   Sepsis Labs: @LABRCNTIP (procalcitonin:4,lacticidven:4) ) Recent Results (from the past 240 hour(s))  SARS CORONAVIRUS 2 (TAT 6-24 HRS) Nasopharyngeal Nasopharyngeal Swab     Status: None   Collection Time: 07/19/19  4:24 PM   Specimen: Nasopharyngeal Swab  Result Value Ref Range Status   SARS Coronavirus 2 NEGATIVE NEGATIVE Final    Comment: (NOTE) SARS-CoV-2 target nucleic acids are NOT DETECTED. The SARS-CoV-2 RNA is generally detectable in upper and lower respiratory specimens during the acute phase of infection. Negative results do not preclude SARS-CoV-2 infection, do not rule out co-infections with other pathogens, and should not be used as the sole basis for treatment or other patient management decisions. Negative results must be combined with clinical observations, patient history, and epidemiological information. The expected result is Negative. Fact Sheet for Patients: 07/21/19 Fact Sheet for Healthcare Providers: HairSlick.no This test is not yet approved or cleared by the quierodirigir.com FDA and  has been authorized for detection and/or diagnosis of SARS-CoV-2 by FDA under an Emergency Use Authorization (EUA). This EUA will remain  in effect (meaning this test can be used) for the duration of the COVID-19 declaration under Section 56 4(b)(1) of the Act, 21 U.S.C. section 360bbb-3(b)(1), unless the authorization is terminated or revoked sooner. Performed at [facility], [address]  [place], [place]          Radiology Studies: No results found.    Scheduled Meds: . busPIRone  7.5 mg Oral TID  . cholecalciferol  1,000 Units Oral Daily  . cloNIDine  0.1 mg Oral TID  . FLUoxetine  10 mg Oral Daily  . gabapentin  300 mg Oral TID  . metoprolol tartrate  25 mg Oral BID  . pneumococcal 23 valent vaccine  0.5 mL Intramuscular Tomorrow-1000  . thiamine  100 mg Oral Daily   Or  . thiamine  100 mg Intravenous Daily  . traZODone  100 mg Oral QHS   Continuous Infusions: . dextrose 5 % and 0.9 % NaCl with KCl 20 mEq/L 100 mL/hr at 07/26/19 0146     LOS: 7 days      09/23/19, MD [facility] Pager: [email or web address] Password [email or web address] 07/26/2019, 10:30 AM

## 2019-07-26 NOTE — Plan of Care (Signed)
  Problem: Education: Goal: Knowledge of disease or condition will improve Outcome: Progressing Goal: Understanding of discharge needs will improve Outcome: Progressing   Problem: Health Behavior/Discharge Planning: Goal: Ability to identify changes in lifestyle to reduce recurrence of condition will improve Outcome: Progressing Goal: Identification of resources available to assist in meeting health care needs will improve Outcome: Progressing   Problem: Physical Regulation: Goal: Complications related to the disease process, condition or treatment will be avoided or minimized Outcome: Progressing   Problem: Safety: Goal: Ability to remain free from injury will improve Outcome: Progressing   Problem: Education: Goal: Knowledge of General Education information will improve Description: Including pain rating scale, medication(s)/side effects and non-pharmacologic comfort measures Outcome: Progressing   Problem: Health Behavior/Discharge Planning: Goal: Ability to manage health-related needs will improve Outcome: Progressing   Problem: Clinical Measurements: Goal: Ability to maintain clinical measurements within normal limits will improve Outcome: Progressing Goal: Will remain free from infection Outcome: Progressing Goal: Diagnostic test results will improve Outcome: Progressing Goal: Respiratory complications will improve Outcome: Progressing Goal: Cardiovascular complication will be avoided Outcome: Progressing   Problem: Activity: Goal: Risk for activity intolerance will decrease Outcome: Progressing   Problem: Nutrition: Goal: Adequate nutrition will be maintained Outcome: Progressing   Problem: Coping: Goal: Level of anxiety will decrease Outcome: Progressing   Problem: Elimination: Goal: Will not experience complications related to bowel motility Outcome: Progressing Goal: Will not experience complications related to urinary retention Outcome:  Progressing   Problem: Pain Managment: Goal: General experience of comfort will improve Outcome: Progressing   Problem: Safety: Goal: Ability to remain free from injury will improve Outcome: Progressing

## 2019-07-27 DIAGNOSIS — F419 Anxiety disorder, unspecified: Secondary | ICD-10-CM

## 2019-07-27 LAB — CBC
HCT: 41.2 % (ref 39.0–52.0)
Hemoglobin: 14 g/dL (ref 13.0–17.0)
MCH: 33.2 pg (ref 26.0–34.0)
MCHC: 34 g/dL (ref 30.0–36.0)
MCV: 97.6 fL (ref 80.0–100.0)
Platelets: 145 10*3/uL — ABNORMAL LOW (ref 150–400)
RBC: 4.22 MIL/uL (ref 4.22–5.81)
RDW: 13.9 % (ref 11.5–15.5)
WBC: 7.2 10*3/uL (ref 4.0–10.5)
nRBC: 0 % (ref 0.0–0.2)

## 2019-07-27 LAB — COMPREHENSIVE METABOLIC PANEL
ALT: 129 U/L — ABNORMAL HIGH (ref 0–44)
AST: 69 U/L — ABNORMAL HIGH (ref 15–41)
Albumin: 3.3 g/dL — ABNORMAL LOW (ref 3.5–5.0)
Alkaline Phosphatase: 47 U/L (ref 38–126)
Anion gap: 8 (ref 5–15)
BUN: 7 mg/dL (ref 6–20)
CO2: 25 mmol/L (ref 22–32)
Calcium: 9.5 mg/dL (ref 8.9–10.3)
Chloride: 107 mmol/L (ref 98–111)
Creatinine, Ser: 1.12 mg/dL (ref 0.61–1.24)
GFR calc Af Amer: >60 mL/min (ref >60)
GFR calc non Af Amer: >60 mL/min (ref >60)
Glucose, Bld: 110 mg/dL — ABNORMAL HIGH (ref 70–99)
Potassium: 4 mmol/L (ref 3.5–5.1)
Sodium: 140 mmol/L (ref 135–145)
Total Bilirubin: 1.4 mg/dL — ABNORMAL HIGH (ref 0.3–1.2)
Total Protein: 6.1 g/dL — ABNORMAL LOW (ref 6.5–8.1)

## 2019-07-27 MED ORDER — CLONIDINE HCL 0.1 MG PO TABS: 0.1000 mg | ORAL_TABLET | Freq: Three times a day (TID) | ORAL | 0 refills | Status: DC

## 2019-07-27 MED ORDER — BUSPIRONE HCL 10 MG PO TABS: 10.0000 mg | ORAL_TABLET | Freq: Three times a day (TID) | ORAL | 0 refills | Status: DC

## 2019-07-27 MED ORDER — TRAZODONE HCL 100 MG PO TABS: 100.0000 mg | ORAL_TABLET | Freq: Every evening | ORAL | 0 refills | Status: DC | PRN

## 2019-07-27 MED ORDER — METOPROLOL TARTRATE 25 MG PO TABS: 25.0000 mg | ORAL_TABLET | Freq: Two times a day (BID) | ORAL | 0 refills | Status: DC

## 2019-07-27 MED ORDER — HYDROXYZINE HCL 50 MG PO TABS: 50.0000 mg | ORAL_TABLET | Freq: Three times a day (TID) | ORAL | 0 refills | Status: DC | PRN

## 2019-07-27 MED ORDER — VITAMIN D3 10 MCG (400 UNIT) PO CAPS: 1.0000 | ORAL_CAPSULE | Freq: Every day | ORAL | 0 refills | Status: DC

## 2019-07-27 MED ORDER — GABAPENTIN 300 MG PO CAPS: 300.0000 mg | ORAL_CAPSULE | Freq: Three times a day (TID) | ORAL | 0 refills | Status: DC

## 2019-07-27 MED ORDER — FLUOXETINE HCL 10 MG PO CAPS: 10.0000 mg | ORAL_CAPSULE | Freq: Every day | ORAL | 3 refills | Status: DC

## 2019-07-27 MED ORDER — THIAMINE HCL 100 MG PO TABS: 100.0000 mg | ORAL_TABLET | Freq: Every day | ORAL | 0 refills | Status: DC

## 2019-07-27 MED FILL — FLUoxetine HCL 10 MG CAPS: 10 | 30 days supply | Qty: 30 | Fill #0

## 2019-07-27 MED FILL — GABAPENTIN 300 MG CAPSULE: 300 | 30 days supply | Qty: 90 | Fill #0

## 2019-07-27 MED FILL — VITAMIN B-1 100 MG TABS: 100 | 30 days supply | Qty: 30 | Fill #0

## 2019-07-27 MED FILL — traZODone HCL 100 MG TABS: 100 | 30 days supply | Qty: 30 | Fill #0

## 2019-07-27 MED FILL — METOPROLOL TARTRATE 25 MG T: 25 | 30 days supply | Qty: 60 | Fill #0

## 2019-07-27 MED FILL — cloNIDine HCL 0.1 MG TABS: 0.1 | 30 days supply | Qty: 90 | Fill #0

## 2019-07-27 MED FILL — busPIRone HCL 10 MG TABS: 10 | 30 days supply | Qty: 90 | Fill #0

## 2019-07-27 MED FILL — HYDROXYZINE PAM 25 MG CAP: 25 | 10 days supply | Qty: 60 | Fill #0

## 2019-07-27 NOTE — Discharge Summary (Addendum)
Physician Discharge Summary  [name] VHQ:[id number] DOB: [birth year] DOA: 07/19/2019  PCP: Being referred to [facility]  Admit date: 07/19/2019 Discharge date: 07/27/2019  Admitted From: home Disposition:  home   Recommendations for Outpatient Follow-up:  1. F/u on anxiety with psychiatry referral   Discharge Condition:  stable   CODE STATUS:  Full code   Diet recommendation:  Heart healthy Consultations:  psychiatry    Discharge Diagnoses:  Principal Problem:   Polysubstance dependence including opioid type drug with complication, continuous use (HCC) Active Problems:   Anxiety   Depression   Essential hypertension, benign   Alcohol dependence with uncomplicated withdrawal (HCC)   LFT elevation   Substance abuse (HCC)   Benzodiazepine withdrawal without complication (HCC)     Brief Summary: Eric Keller is a 34 year old male with a medical history of alcoholism, benzodiazepine abuse/dependence, and hypertension who presented on 1/28 for tremors and myalgias. He was noted to be in ETOH withdrawal. A banana bag was started and CIWA initiated. No seizure-like activity.    Hospital Course:  Principal Problem: Alcohol and Benzodiazepine withdrawal Underlying severe anxiety and depression - he has been drinking daily and using multiple milligrams (up to 6 mg a day) of non prescribed Xanax daily for about 1 yr now - he underwent a prolonged withdrawal and was managed with IV and oral Ativan -  He remains on Clonidine to TID routine- He states this helps a great deal  - cont Buspar TID, Neurontin TID, Hydroxyzine to 50 mg PRN, Trazodone PRN QHS and Prozac - If his anxiety and depresssion does not remain will controlled he has a strong tendency to abuse alcohol and Benzodiazepines again- he will be going to [facility] for residential rehab tomorrow- he is also going to follow up as outpatient with mental health - I have referred him to [facility] to ensure he continues to be seen by a PCP  and is able to obtain prescriptions    Active Problems:  Vomiting and nausea- related to withdrawl -  Has resolved      Essential hypertension, benign - changed Coreg to Metoprolol to help with anxiety and tremors - He is also on Clonidine but this was started for withdrawal and anxiety and appears to be helping     LFT elevation - related to ETOH abuse and improving with abstinence  Hypokalemia - due to diarrhea- replaced  Diarrhea - Due to anxiety and withdrawal - given PRN Lomotil   Thrombocytopenia - Due to ETOH abuse - hold blood thinners- nadir was 95 -is improving and is 145 today     Discharge Exam: Vitals:   07/26/19 2030 07/27/19 0525  BP: 108/69 112/74  Pulse: 63 (!) 55  Resp: 16 15  Temp: 98.8 F (37.1 C) 98.7 F (37.1 C)  SpO2: 99% 100%   Vitals:   07/26/19 1734 07/26/19 2030 07/26/19 2030 07/27/19 0525  BP: 127/89 108/69 108/69 112/74  Pulse: 70 66 63 (!) 55  Resp: 17 16 16 15   Temp: 98.7 F (37.1 C) 98.8 F (37.1 C) 98.8 F (37.1 C) 98.7 F (37.1 C)  TempSrc: Oral Oral Oral Oral  SpO2: 99% 99% 99% 100%  Weight:   89.4 kg   Height:        General: Pt is alert, awake, not in acute distress Cardiovascular: RRR, S1/S2 +, no rubs, no gallops Respiratory: CTA bilaterally, no wheezing, no rhonchi Abdominal: Soft, NT, ND, bowel sounds + Extremities: no edema, no cyanosis   Discharge Instructions  Discharge Instructions    Diet - low sodium heart healthy   Complete by: As directed    Increase activity slowly   Complete by: As directed      Allergies as of 07/27/2019   No Known Allergies     Medication List    STOP taking these medications   carvedilol 12.5 MG tablet Commonly known as: COREG   lisinopril 5 MG tablet Commonly known as: ZESTRIL   ondansetron 4 MG tablet Commonly known as: Zofran     TAKE these medications   busPIRone 10 MG tablet Commonly known as: BUSPAR Take 1 tablet (10 mg total) by mouth 3  (three) times daily.   cloNIDine 0.1 MG tablet Commonly known as: CATAPRES Take 1 tablet (0.1 mg total) by mouth 3 (three) times daily.   FLUoxetine 10 MG capsule Commonly known as: PROZAC Take 1 capsule (10 mg total) by mouth daily.   gabapentin 300 MG capsule Commonly known as: NEURONTIN Take 1 capsule (300 mg total) by mouth 3 (three) times daily.   hydrOXYzine 50 MG tablet Commonly known as: ATARAX/VISTARIL Take 1 tablet (50 mg total) by mouth every 8 (eight) hours as needed for anxiety.   metoprolol tartrate 25 MG tablet Commonly known as: LOPRESSOR Take 1 tablet (25 mg total) by mouth 2 (two) times daily.   thiamine 100 MG tablet Take 1 tablet (100 mg total) by mouth daily.   traZODone 100 MG tablet Commonly known as: DESYREL Take 1 tablet (100 mg total) by mouth at bedtime as needed for sleep.   Vitamin D3 10 MCG (400 UNIT) Caps Take 1 tablet by mouth daily.       No Known Allergies   Procedures/Studies:    DG Chest 2 View  Result Date: 07/19/2019 CLINICAL DATA:  Chest pain. EXAM: CHEST - 2 VIEW COMPARISON:  08/24/2018. FINDINGS: Normal heart, mediastinum and hila. Clear lungs.  No pleural effusion or pneumothorax. Skeletal structures are unremarkable. IMPRESSION: Normal chest radiographs. Electronically Signed   By: Amie Portland M.D.   On: 07/19/2019 11:54   US Abdomen Limited RUQ  Result Date: 07/20/2019 CLINICAL DATA:  Elevated LFTs EXAM: ULTRASOUND ABDOMEN LIMITED RIGHT UPPER QUADRANT COMPARISON:  CT abdomen pelvis 05/28/2019 FINDINGS: Gallbladder: No gallstones or wall thickening visualized. No sonographic Murphy sign noted by sonographer. Common bile duct: Diameter: 4 mm, nondilated Liver: Diffusely increased hepatic echogenicity with loss of definition of the portal triads and diminished posterior through transmission compatible with hepatic steatosis. No focal lesion. Portal vein is patent on color Doppler imaging with normal direction of blood flow  towards the liver. Other: None. IMPRESSION: Diffusely increased hepatic echogenicity with decreased through transmission is most compatible with hepatic steatosis. Otherwise unremarkable right upper quadrant ultrasound. Electronically Signed   By: Kreg Shropshire M.D.   On: 07/20/2019 05:19     The results of significant diagnostics from this hospitalization (including imaging, microbiology, ancillary and laboratory) are listed below for reference.     Microbiology: Recent Results (from the past 240 hour(s))  SARS CORONAVIRUS 2 (TAT 6-24 HRS) Nasopharyngeal Nasopharyngeal Swab     Status: None   Collection Time: 07/19/19  4:24 PM   Specimen: Nasopharyngeal Swab  Result Value Ref Range Status   SARS Coronavirus 2 NEGATIVE NEGATIVE Final    Comment: (NOTE) SARS-CoV-2 target nucleic acids are NOT DETECTED. The SARS-CoV-2 RNA is generally detectable in upper and lower respiratory specimens during the acute phase of infection. Negative results do not preclude SARS-CoV-2 infection, do  not rule out co-infections with other pathogens, and should not be used as the sole basis for treatment or other patient management decisions. Negative results must be combined with clinical observations, patient history, and epidemiological information. The expected result is Negative. Fact Sheet for Patients: HairSlick.no Fact Sheet for Healthcare Providers: quierodirigir.com This test is not yet approved or cleared by the [place] FDA and  has been authorized for detection and/or diagnosis of SARS-CoV-2 by FDA under an Emergency Use Authorization (EUA). This EUA will remain  in effect (meaning this test can be used) for the duration of the COVID-19 declaration under Section 56 4(b)(1) of the Act, 21 U.S.C. section 360bbb-3(b)(1), unless the authorization is terminated or revoked sooner. Performed at [facility], [address],  [place], [place] [address]      Labs: BNP (last 3 results) No results for input(s): BNP in the last 8760 hours. Basic Metabolic Panel: Recent Labs  Lab 07/22/19 0629 07/22/19 0629 07/23/19 0245 07/24/19 0527 07/25/19 0501 07/26/19 0750 07/27/19 0432  NA 141   < > 143 139 138 139 140  K 3.7   < > 3.4* 4.1 3.7 4.5 4.0  CL 108   < > 107 106 103 106 107  CO2 25   < > 26 27 23 24 25   GLUCOSE 101*   < > 104* 113* 117* 146* 110*  BUN <5*   < > <5* 6 7 7 7   CREATININE 1.17   < > 1.14 1.27* 1.05 1.25* 1.12  CALCIUM 8.8*   < > 9.2 9.2 9.5 9.0 9.5  MG 1.9  --   --   --   --   --   --    < > = values in this interval not displayed.   Liver Function Tests: Recent Labs  Lab 07/22/19 0629 07/23/19 0245 07/24/19 0527 07/25/19 0501 07/27/19 0432  AST 154* 163* 124* 104* 69*  ALT 129* 149* 154* 149* 129*  ALKPHOS 65 59 61 50 47  BILITOT 1.8* 1.5* 1.5* 1.5* 1.4*  PROT 5.4* 5.9* 6.5 6.4* 6.1*  ALBUMIN 2.7* 2.9* 3.1* 3.3* 3.3*   No results for input(s): LIPASE, AMYLASE in the last 168 hours. No results for input(s): AMMONIA in the last 168 hours. CBC: Recent Labs  Lab 07/23/19 0245 07/24/19 0527 07/25/19 0501 07/26/19 0750 07/27/19 0432  WBC 6.1 7.7 7.3 6.3 7.2  HGB 14.4 15.1 14.6 13.9 14.0  HCT 41.9 45.3 42.7 40.4 41.2  MCV 97.4 99.8 97.0 98.1 97.6  PLT 87* 102* 112* 115* 145*   Cardiac Enzymes: No results for input(s): CKTOTAL, CKMB, CKMBINDEX, TROPONINI in the last 168 hours. BNP: Invalid input(s): POCBNP CBG: No results for input(s): GLUCAP in the last 168 hours. D-Dimer No results for input(s): DDIMER in the last 72 hours. Hgb A1c No results for input(s): HGBA1C in the last 72 hours. Lipid Profile No results for input(s): CHOL, HDL, LDLCALC, TRIG, CHOLHDL, LDLDIRECT in the last 72 hours. Thyroid function studies No results for input(s): TSH, T4TOTAL, T3FREE, THYROIDAB in the last 72 hours.  Invalid input(s): FREET3 Anemia work up No results for input(s):  VITAMINB12, FOLATE, FERRITIN, TIBC, IRON, RETICCTPCT in the last 72 hours. Urinalysis    Component Value Date/Time   COLORURINE YELLOW 07/19/2019 2100   APPEARANCEUR CLEAR 07/19/2019 2100   APPEARANCEUR Clear 12/10/2011 1538   LABSPEC 1.012 07/19/2019 2100   LABSPEC 1.004 12/10/2011 1538   PHURINE 6.0 07/19/2019 2100   GLUCOSEU NEGATIVE 07/19/2019 2100  GLUCOSEU Negative 12/10/2011 1538   HGBUR NEGATIVE 07/19/2019 2100   BILIRUBINUR NEGATIVE 07/19/2019 2100   BILIRUBINUR Negative 12/10/2011 1538   KETONESUR 5 (A) 07/19/2019 2100   PROTEINUR NEGATIVE 07/19/2019 2100   UROBILINOGEN 0.2 12/17/2018 1836   NITRITE NEGATIVE 07/19/2019 2100   LEUKOCYTESUR NEGATIVE 07/19/2019 2100   LEUKOCYTESUR Negative 12/10/2011 1538   Sepsis Labs Invalid input(s): PROCALCITONIN,  WBC,  LACTICIDVEN Microbiology Recent Results (from the past 240 hour(s))  SARS CORONAVIRUS 2 (TAT 6-24 HRS) Nasopharyngeal Nasopharyngeal Swab     Status: None   Collection Time: 07/19/19  4:24 PM   Specimen: Nasopharyngeal Swab  Result Value Ref Range Status   SARS Coronavirus 2 NEGATIVE NEGATIVE Final    Comment: (NOTE) SARS-CoV-2 target nucleic acids are NOT DETECTED. The SARS-CoV-2 RNA is generally detectable in upper and lower respiratory specimens during the acute phase of infection. Negative results do not preclude SARS-CoV-2 infection, do not rule out co-infections with other pathogens, and should not be used as the sole basis for treatment or other patient management decisions. Negative results must be combined with clinical observations, patient history, and epidemiological information. The expected result is Negative. Fact Sheet for Patients: SugarRoll.be Fact Sheet for Healthcare Providers: https://www.woods-mathews.com/ This test is not yet approved or cleared by the [place] FDA and  has been authorized for detection and/or diagnosis of SARS-CoV-2 by FDA  under an Emergency Use Authorization (EUA). This EUA will remain  in effect (meaning this test can be used) for the duration of the COVID-19 declaration under Section 56 4(b)(1) of the Act, 21 U.S.C. section 360bbb-3(b)(1), unless the authorization is terminated or revoked sooner. Performed at [facility], [place] [address] [place], [place] [address]      Time coordinating discharge in minutes: 65  SIGNED:   Debbe Odea, MD  [facility] 07/27/2019, 8:30 AM Pager   If 7PM-7AM, please contact night-coverage [email or web address] Password [email or web address]

## 2019-07-27 NOTE — Progress Notes (Signed)
Eric Keller to be discharged Home per MD order. Discussed prescriptions and follow up appointments with the patient. Prescriptions given to patient; medication list explained in detail. Patient verbalized understanding.  Skin clean, dry and intact without evidence of skin break down, no evidence of skin tears noted. IV catheter discontinued intact. Site without signs and symptoms of complications. Dressing and pressure applied. Pt denies pain at the site currently. No complaints noted.  Patient free of lines, drains, and wounds.   An After Visit Summary (AVS) was printed and given to the patient. Patient refused to be escorted and walked out of the unit with his father.  Arvilla Meres, RN

## 2019-07-27 NOTE — TOC Transition Note (Signed)
Transition of Care [facility]) - CM/SW Discharge Note   Patient Details  Name: [name] MRN: [id number] Date of Birth: 1986-02-13  Transition of Care [facility]) CM/SW Contact:  Bess Kinds, RN Phone Number: [phone] 07/27/2019, 11:22 AM   Clinical Narrative:    Spoke with patient at the bedside. Will be going home with dad until he gets back on his feet. Has plans in progress to get treatment with [facility]. Unable to pay for meds at this time. Match created with request for override. TOC pharmacy to deliver meds to bedside. Unable to return to previous provider d/t outstanding bill. Appointment scheduled with [facility]. Patient states dad will pick him up to transport home. No further TOC needs identified at this time.     Final next level of care: Home/Self Care Barriers to Discharge: No Barriers Identified   Patient Goals and CMS Choice Patient states their goals for this hospitalization and ongoing recovery are:: home with dad until he gets back on his feet CMS Medicare.gov Compare Post Acute Care list provided to:: Patient Choice offered to / list presented to : NA  Discharge Placement                       Discharge Plan and Services                DME Arranged: N/A         HH Arranged: NA          Social Determinants of Health (SDOH) Interventions     Readmission Risk Interventions No flowsheet data found.

## 2019-07-28 ENCOUNTER — Emergency Department (HOSPITAL_COMMUNITY)
Admission: EM | Admit: 2019-07-28 | Discharge: 2019-07-29 | Disposition: A | Discharge status: Home / Self Care | Payer: Self-pay | Attending: Emergency Medicine | Admitting: Emergency Medicine

## 2019-07-28 ENCOUNTER — Other Ambulatory Visit: Payer: Self-pay

## 2019-07-28 ENCOUNTER — Encounter (HOSPITAL_COMMUNITY): Payer: Self-pay | Admitting: *Deleted

## 2019-07-28 DIAGNOSIS — I10 Essential (primary) hypertension: Secondary | ICD-10-CM | POA: Insufficient documentation

## 2019-07-28 DIAGNOSIS — F19230 Other psychoactive substance dependence with withdrawal, uncomplicated: Secondary | ICD-10-CM | POA: Insufficient documentation

## 2019-07-28 DIAGNOSIS — Z79899 Other long term (current) drug therapy: Secondary | ICD-10-CM | POA: Insufficient documentation

## 2019-07-28 DIAGNOSIS — R569 Unspecified convulsions: Secondary | ICD-10-CM | POA: Insufficient documentation

## 2019-07-28 LAB — URINALYSIS, ROUTINE W REFLEX MICROSCOPIC
Bilirubin Urine: NEGATIVE
Glucose, UA: NEGATIVE mg/dL
Hgb urine dipstick: NEGATIVE
Ketones, ur: NEGATIVE mg/dL
Leukocytes,Ua: NEGATIVE
Nitrite: NEGATIVE
Protein, ur: NEGATIVE mg/dL
Specific Gravity, Urine: 1.009 (ref 1.005–1.030)
pH: 7 (ref 5.0–8.0)

## 2019-07-28 LAB — CBC
HCT: 41.8 % (ref 39.0–52.0)
Hemoglobin: 14.4 g/dL (ref 13.0–17.0)
MCH: 33.3 pg (ref 26.0–34.0)
MCHC: 34.4 g/dL (ref 30.0–36.0)
MCV: 96.5 fL (ref 80.0–100.0)
Platelets: 254 10*3/uL (ref 150–400)
RBC: 4.33 MIL/uL (ref 4.22–5.81)
RDW: 13.3 % (ref 11.5–15.5)
WBC: 10.6 10*3/uL — ABNORMAL HIGH (ref 4.0–10.5)
nRBC: 0 % (ref 0.0–0.2)

## 2019-07-28 LAB — CBG MONITORING, ED: Glucose-Capillary: 117 mg/dL — ABNORMAL HIGH (ref 70–99)

## 2019-07-28 NOTE — ED Triage Notes (Signed)
Pt arrives via [facility] from home with c/o possible seizure. EMS went out initially, dad heard a thump, found him on floor and he was shaking (around 2030), EMS left.  Called back to the scene  for all over "muscle cramping"  Pt pupils are equal and reactive, nausea, redness to the forehead from when he fell, decreased appetite.  Pt was d/c 2 days ago from ETOH and [facility] detox. HR 100, SPO2 98%,  97.2, 146/86, IV established in the right AC.

## 2019-07-28 NOTE — ED Triage Notes (Signed)
Pt says that the last thing he remembers was that he was cooking rice, and he said that he must have passed out. He says he has a headache and does not feel well. Denies prior hx of seizures. He took hydroxyzine after the episode, no change in symptoms. Denies ETOH or drug use.

## 2019-07-29 ENCOUNTER — Emergency Department (HOSPITAL_COMMUNITY): Payer: Self-pay

## 2019-07-29 LAB — HEPATIC FUNCTION PANEL
ALT: 117 U/L — ABNORMAL HIGH (ref 0–44)
AST: 63 U/L — ABNORMAL HIGH (ref 15–41)
Albumin: 3.8 g/dL (ref 3.5–5.0)
Alkaline Phosphatase: 51 U/L (ref 38–126)
Bilirubin, Direct: 0.3 mg/dL — ABNORMAL HIGH (ref 0.0–0.2)
Indirect Bilirubin: 1 mg/dL — ABNORMAL HIGH (ref 0.3–0.9)
Total Bilirubin: 1.3 mg/dL — ABNORMAL HIGH (ref 0.3–1.2)
Total Protein: 7 g/dL (ref 6.5–8.1)

## 2019-07-29 LAB — BASIC METABOLIC PANEL
Anion gap: 12 (ref 5–15)
BUN: 11 mg/dL (ref 6–20)
CO2: 22 mmol/L (ref 22–32)
Calcium: 9.1 mg/dL (ref 8.9–10.3)
Chloride: 103 mmol/L (ref 98–111)
Creatinine, Ser: 1.14 mg/dL (ref 0.61–1.24)
GFR calc Af Amer: >60 mL/min (ref >60)
GFR calc non Af Amer: >60 mL/min (ref >60)
Glucose, Bld: 119 mg/dL — ABNORMAL HIGH (ref 70–99)
Potassium: 3.6 mmol/L (ref 3.5–5.1)
Sodium: 137 mmol/L (ref 135–145)

## 2019-07-29 LAB — RAPID URINE DRUG SCREEN, HOSP PERFORMED
Amphetamines: NOT DETECTED
Barbiturates: NOT DETECTED
Benzodiazepines: NOT DETECTED
Cocaine: NOT DETECTED
Opiates: NOT DETECTED
Tetrahydrocannabinol: POSITIVE — AB

## 2019-07-29 LAB — MAGNESIUM: Magnesium: 2.2 mg/dL (ref 1.7–2.4)

## 2019-07-29 LAB — ETHANOL: Alcohol, Ethyl (B): <10 mg/dL (ref <10)

## 2019-07-29 MED ORDER — LORAZEPAM 2 MG/ML IJ SOLN
1.0000 mg | Freq: Once | INTRAMUSCULAR | Status: AC
  Administered 2019-07-29: 1 mg via INTRAVENOUS
  Filled 2019-07-29: qty 1

## 2019-07-29 MED ORDER — CHLORDIAZEPOXIDE HCL 25 MG PO CAPS: ORAL_CAPSULE | ORAL | 0 refills | Status: DC

## 2019-07-29 MED ORDER — SODIUM CHLORIDE 0.9 % IV BOLUS
500.0000 mL | Freq: Once | INTRAVENOUS | Status: AC
  Administered 2019-07-29: 01:00:00 500 mL via INTRAVENOUS

## 2019-07-29 MED ORDER — CLONIDINE HCL 0.1 MG PO TABS
0.1000 mg | ORAL_TABLET | Freq: Once | ORAL | Status: AC
  Administered 2019-07-29: 01:00:00 0.1 mg via ORAL
  Filled 2019-07-29: qty 1

## 2019-07-29 MED ORDER — IBUPROFEN 400 MG PO TABS
600.0000 mg | ORAL_TABLET | Freq: Once | ORAL | Status: AC
  Administered 2019-07-29: 600 mg via ORAL
  Filled 2019-07-29: qty 1

## 2019-07-29 NOTE — Discharge Instructions (Addendum)
Your seizure tonight was most likely related to your drug withdrawal. This has been treated in the ED with a small dose of benzodiazepine and will need to continue treatment at home with the prescription for Librium taper dosing.   Recommend follow up as planned with [facility] rehabilitation facility.

## 2019-07-29 NOTE — ED Notes (Signed)
Pt transported to CT ?

## 2019-07-29 NOTE — ED Provider Notes (Signed)
[facility] EMERGENCY DEPARTMENT Provider Note   CSN: [id number] Arrival date & time: 07/28/19  2246     History Chief Complaint  Patient presents with  . Loss of Consciousness    Eric Keller is a 34 y.o. male.  Patient with history of polysubstance abuse, benzodiazapine dependence with recent detox admission from 07/22/19 to 07/27/19 presents with possible seizure tonight at home. Per EMS, father heard him fall and found him on the floor unconscious and shaking. Patient denies prior history of seizures. He states he has not felt well since being discharged from the hospital but symptoms of generalized weakness, shakiness/"muscle twitching" and myalgias worsened throughout today. He reports he is taking his home medications and states these are Prozac, Buspar, hydroxyzine, and blood pressure medication. On chart review he was also prescribed Clonidine TID and Trazodone. He confirms he took Clonidine only this morning and is not taking trazodone.  He denies alcohol or drug use since discharge.   The history is provided by the patient and the EMS personnel. No language interpreter was used.       Past Medical History:  Diagnosis Date  . Alcohol withdrawal (HCC) 07/20/2019  . Anxiety   . Anxiety   . Depression   . H/O: substance abuse (HCC)   . Hypertension     Patient Active Problem List   Diagnosis Date Noted  . 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 Benzodiazepine withdrawal without complication (HCC) 07/22/2019  . Alcohol dependence with uncomplicated withdrawal (HCC) 07/19/2019  . LFT elevation   . Essential hypertension, benign 05/11/2018  . Flushing 05/11/2018  . Nonintractable headache 05/11/2018  . Erythrocytosis 05/11/2018  . Anxiety 04/14/2011  . Depression 04/14/2011    Past Surgical History:  Procedure Laterality Date  . WISDOM TOOTH EXTRACTION         Family History    Problem Relation Age of Onset  . Cancer Mother   . Hypertension Paternal Grandfather   . Heart disease Paternal Grandfather        MIs  . Heart disease Other   . Stroke Other     Social History   Tobacco Use  . Smoking status: Never Smoker  . Smokeless tobacco: Never Used  Substance Use Topics  . Alcohol use: Yes    Comment: daily 5th of liquor  . Drug use: Not Currently    Types: Marijuana    Comment: Vicodin Abuse    Home Medications Prior to Admission medications   Medication Sig Start Date End Date Taking? Authorizing Provider  busPIRone (BUSPAR) 10 MG tablet Take 1 tablet (10 mg total) by mouth 3 (three) times daily. 07/27/19   Calvert Cantor, MD  Cholecalciferol (VITAMIN D3) 10 MCG (400 UNIT) CAPS Take 1 tablet by mouth daily. 07/27/19   Calvert Cantor, MD  cloNIDine (CATAPRES) 0.1 MG tablet Take 1 tablet (0.1 mg total) by mouth 3 (three) times daily. Do not stop suddenly 07/27/19   Calvert Cantor, MD  FLUoxetine (PROZAC) 10 MG capsule Take 1 capsule (10 mg total) by mouth daily. 07/27/19   Calvert Cantor, MD  gabapentin (NEURONTIN) 300 MG capsule Take 1 capsule (300 mg total) by mouth 3 (three) times daily. 07/27/19   Calvert Cantor, MD  hydrOXYzine (ATARAX/VISTARIL) 50 MG tablet Take 1 tablet (50 mg total) by mouth every 8 (eight) hours as needed for anxiety. 07/27/19   Calvert Cantor, MD  metoprolol tartrate (LOPRESSOR) 25 MG tablet Take 1  tablet (25 mg total) by mouth 2 (two) times daily. 07/27/19   Debbe Odea, MD  thiamine 100 MG tablet Take 1 tablet (100 mg total) by mouth daily. 07/27/19   Debbe Odea, MD  traZODone (DESYREL) 100 MG tablet Take 1 tablet (100 mg total) by mouth at bedtime as needed for sleep. 07/27/19   Debbe Odea, MD    Allergies    Patient has no known allergies.  Review of Systems   Review of Systems  Constitutional: Positive for fatigue. Negative for chills and fever.  HENT: Negative.   Respiratory: Negative.  Negative for shortness of breath.    Cardiovascular: Negative.  Negative for chest pain.  Gastrointestinal: Positive for nausea. Negative for abdominal pain.  Genitourinary: Negative.   Musculoskeletal: Positive for myalgias.  Skin: Negative.   Neurological: Positive for tremors, seizures (See HPI.) and weakness. Negative for headaches.    Physical Exam Updated Vital Signs BP (!) 157/99   Pulse 88   Temp 97.9 F (36.6 C) (Oral)   Resp (!) 22   SpO2 98%   Physical Exam Constitutional:      General: He is not in acute distress.    Appearance: He is well-developed.  HENT:     Head: Normocephalic.     Mouth/Throat:     Mouth: Mucous membranes are moist.  Cardiovascular:     Rate and Rhythm: Normal rate and regular rhythm.     Heart sounds: No murmur.  Pulmonary:     Effort: Pulmonary effort is normal.     Breath sounds: Normal breath sounds. No wheezing, rhonchi or rales.  Abdominal:     General: Bowel sounds are normal.     Palpations: Abdomen is soft.     Tenderness: There is no abdominal tenderness. There is no guarding or rebound.  Musculoskeletal:        General: Normal range of motion.     Cervical back: Normal range of motion and neck supple.  Skin:    General: Skin is warm and dry.     Findings: No bruising or erythema.  Neurological:     Mental Status: He is alert.     GCS: GCS eye subscore is 4. GCS verbal subscore is 5. GCS motor subscore is 6.     Comments: Has generalized weakness, follows command, CN's 3-12 intact. He is oriented. No dysarthria, or coordination deficits.      ED Results / Procedures / Treatments   Labs (all labs ordered are listed, but only abnormal results are displayed) Labs Reviewed  BASIC METABOLIC PANEL - Abnormal; Notable for the following components:      Result Value   Glucose, Bld 119 (*)    All other components within normal limits  CBC - Abnormal; Notable for the following components:   WBC 10.6 (*)    All other components within normal limits  CBG  MONITORING, ED - Abnormal; Notable for the following components:   Glucose-Capillary 117 (*)    All other components within normal limits  URINALYSIS, ROUTINE W REFLEX MICROSCOPIC  HEPATIC FUNCTION PANEL  RAPID URINE DRUG SCREEN, HOSP PERFORMED  ETHANOL  MAGNESIUM   Results for orders placed or performed during the hospital encounter of 94/85/46  Basic metabolic panel - if new onset seizures  Result Value Ref Range   Sodium 137 135 - 145 mmol/L   Potassium 3.6 3.5 - 5.1 mmol/L   Chloride 103 98 - 111 mmol/L   CO2 22 22 - 32 mmol/L  Glucose, Bld 119 (H) 70 - 99 mg/dL   BUN 11 6 - 20 mg/dL   Creatinine, Ser 4.09 0.61 - 1.24 mg/dL   Calcium 9.1 8.9 - 81.1 mg/dL   GFR calc non Af Amer >60 >60 mL/min   GFR calc Af Amer >60 >60 mL/min   Anion gap 12 5 - 15  CBC - if new onset seizures  Result Value Ref Range   WBC 10.6 (H) 4.0 - 10.5 K/uL   RBC 4.33 4.22 - 5.81 MIL/uL   Hemoglobin 14.4 13.0 - 17.0 g/dL   HCT 91.4 78.2 - 95.6 %   MCV 96.5 80.0 - 100.0 fL   MCH 33.3 26.0 - 34.0 pg   MCHC 34.4 30.0 - 36.0 g/dL   RDW 21.3 08.6 - 57.8 %   Platelets 254 150 - 400 K/uL   nRBC 0.0 0.0 - 0.2 %  Urinalysis, Routine w reflex microscopic  Result Value Ref Range   Color, Urine YELLOW YELLOW   APPearance CLEAR CLEAR   Specific Gravity, Urine 1.009 1.005 - 1.030   pH 7.0 5.0 - 8.0   Glucose, UA NEGATIVE NEGATIVE mg/dL   Hgb urine dipstick NEGATIVE NEGATIVE   Bilirubin Urine NEGATIVE NEGATIVE   Ketones, ur NEGATIVE NEGATIVE mg/dL   Protein, ur NEGATIVE NEGATIVE mg/dL   Nitrite NEGATIVE NEGATIVE   Leukocytes,Ua NEGATIVE NEGATIVE  Hepatic function panel  Result Value Ref Range   Total Protein 7.0 6.5 - 8.1 g/dL   Albumin 3.8 3.5 - 5.0 g/dL   AST 63 (H) 15 - 41 U/L   ALT 117 (H) 0 - 44 U/L   Alkaline Phosphatase 51 38 - 126 U/L   Total Bilirubin 1.3 (H) 0.3 - 1.2 mg/dL   Bilirubin, Direct 0.3 (H) 0.0 - 0.2 mg/dL   Indirect Bilirubin 1.0 (H) 0.3 - 0.9 mg/dL  Rapid urine drug screen  (hospital performed)  Result Value Ref Range   Opiates NONE DETECTED NONE DETECTED   Cocaine NONE DETECTED NONE DETECTED   Benzodiazepines NONE DETECTED NONE DETECTED   Amphetamines NONE DETECTED NONE DETECTED   Tetrahydrocannabinol POSITIVE (A) NONE DETECTED   Barbiturates NONE DETECTED NONE DETECTED  Ethanol  Result Value Ref Range   Alcohol, Ethyl (B) <10 <10 mg/dL  Magnesium  Result Value Ref Range   Magnesium 2.2 1.7 - 2.4 mg/dL  CBG monitoring, ED  Result Value Ref Range   Glucose-Capillary 117 (H) 70 - 99 mg/dL    EKG EKG Interpretation  Date/Time:  Saturday July 28 2019 23:01:26 EST Ventricular Rate:  105 PR Interval:  190 QRS Duration: 90 QT Interval:  342 QTC Calculation: 452 R Axis:   82 Text Interpretation: Sinus tachycardia ST & T wave abnormality, consider inferolateral ischemia Abnormal ECG Inferior T wave changes similar compared to prior Lateral T wave changes appear new compared to prior Confirmed by Rochele Raring [phone]) on 07/28/2019 11:38:35 PM   Radiology No results found.  Procedures Procedures (including critical care time)  Medications Ordered in ED Medications - No data to display  ED Course  I have reviewed the triage vital signs and the nursing notes.  Pertinent labs & imaging results that were available during my care of the patient were reviewed by me and considered in my medical decision making (see chart for details).    MDM Rules/Calculators/A&P                      Per dad, went into the room  after hearing him fall and found him stiff, without shaking activity, but was foaming at the mouth. Episode lasted about 5 minutes. Post-ictally he was groggy, "confused", no vomiting or urinary incontinence.   Per pharmacy, review of benzo dosing while inpatient: 0-4 mg q 6 hours, until the 1st went to q 6 prn. He received 1 mg on 4th at noon. This was his last dose.   Final Clinical Impression(s) / ED Diagnoses Final diagnoses:  None     1. Drug withdrawal seizure  Rx / DC Orders ED Discharge Orders    None       Elpidio Anis, PA-C 07/29/19 0416    Ward, Layla Maw, DO 07/29/19 [phone]

## 2019-07-29 NOTE — ED Notes (Signed)
Pt ambulated in hall with minimal assist.

## 2019-08-09 ENCOUNTER — Inpatient Hospital Stay: Payer: Self-pay | Admitting: Internal Medicine

## 2019-08-28 ENCOUNTER — Telehealth: Payer: Self-pay

## 2019-08-28 NOTE — Telephone Encounter (Signed)
Called patient to do their pre-visit COVID screening.  Call went to voicemail. Unable to do prescreening.  

## 2019-08-29 ENCOUNTER — Other Ambulatory Visit: Payer: Self-pay

## 2019-08-29 ENCOUNTER — Ambulatory Visit (INDEPENDENT_AMBULATORY_CARE_PROVIDER_SITE_OTHER): Payer: Self-pay | Admitting: Internal Medicine

## 2019-08-29 ENCOUNTER — Encounter: Payer: Self-pay | Admitting: Internal Medicine

## 2019-08-29 VITALS — BP 172/104 | HR 92 | Temp 97.3°F | Resp 17 | Wt 206.8 lb

## 2019-08-29 DIAGNOSIS — R7989 Other specified abnormal findings of blood chemistry: Secondary | ICD-10-CM

## 2019-08-29 DIAGNOSIS — F1920 Other psychoactive substance dependence, uncomplicated: Secondary | ICD-10-CM

## 2019-08-29 DIAGNOSIS — I10 Essential (primary) hypertension: Secondary | ICD-10-CM

## 2019-08-29 DIAGNOSIS — Z7689 Persons encountering health services in other specified circumstances: Secondary | ICD-10-CM

## 2019-08-29 DIAGNOSIS — L0291 Cutaneous abscess, unspecified: Secondary | ICD-10-CM

## 2019-08-29 DIAGNOSIS — Z09 Encounter for follow-up examination after completed treatment for conditions other than malignant neoplasm: Secondary | ICD-10-CM

## 2019-08-29 MED ORDER — CLONIDINE HCL 0.1 MG PO TABS: 0.1000 mg | ORAL_TABLET | Freq: Three times a day (TID) | ORAL | 0 refills | Status: DC

## 2019-08-29 MED ORDER — DOXYCYCLINE HYCLATE 100 MG PO TABS: 100.0000 mg | ORAL_TABLET | Freq: Two times a day (BID) | ORAL | 0 refills | Status: DC

## 2019-08-29 MED ORDER — BUSPIRONE HCL 10 MG PO TABS: 10.0000 mg | ORAL_TABLET | Freq: Three times a day (TID) | ORAL | 0 refills | Status: DC

## 2019-08-29 MED ORDER — TRAZODONE HCL 100 MG PO TABS: 100.0000 mg | ORAL_TABLET | Freq: Every evening | ORAL | 0 refills | Status: DC | PRN

## 2019-08-29 MED ORDER — HYDROXYZINE HCL 50 MG PO TABS: 50.0000 mg | ORAL_TABLET | Freq: Three times a day (TID) | ORAL | 0 refills | Status: DC | PRN

## 2019-08-29 MED ORDER — METOPROLOL TARTRATE 50 MG PO TABS: 50.0000 mg | ORAL_TABLET | Freq: Two times a day (BID) | ORAL | 0 refills | Status: DC

## 2019-08-29 MED ORDER — FLUOXETINE HCL 10 MG PO CAPS: 10.0000 mg | ORAL_CAPSULE | Freq: Every day | ORAL | 3 refills | Status: DC

## 2019-08-29 MED ORDER — GABAPENTIN 300 MG PO CAPS: 300.0000 mg | ORAL_CAPSULE | Freq: Three times a day (TID) | ORAL | 0 refills | Status: DC

## 2019-08-29 NOTE — Progress Notes (Signed)
Subjective:    Eric Keller - 34 y.o. male MRN [id number]  Date of birth: May 24, 1986  HPI  Eric Keller is to establish care. Patient has a PMH significant for polysubstance abuse, anxiety, HTN. Was recently hospitalized and discharged on 2/5. See below for details. Reports he is doing well. Just got a new job week before last. He is taking Clonidine which is helping.   Has concern about an area on his right hip. Thinks he got some type of bug bite a couple weeks ago. The area swelled up and became very hard and red. Eventually it opened and started draining. No fevers, nausea, vomiting, chills.    Brief Summary: Eric Keller a 33 year old male with a medical history of alcoholism, benzodiazepine abuse/dependence, and hypertension who presented on 1/28 for tremors and myalgias. He was noted to be in ETOH withdrawal. A banana bag was started and CIWA initiated. No seizure-like activity.   Hospital Course:  Principal Problem: Alcohol and Benzodiazepine withdrawal Underlying severe anxiety and depression - he has been drinking daily and using multiple milligrams (up to 6 mg a day) of non prescribed Xanax daily for about 1 yr now - he underwent a prolonged withdrawal and was managed with IV and oral Ativan -  He remains on Clonidine to TID routine- He states this helps a great deal  - cont Buspar TID, Neurontin TID, Hydroxyzine to 50 mg PRN, Trazodone PRN QHS and Prozac - If his anxiety and depresssion does not remain will controlled he has a strong tendency to abuse alcohol and Benzodiazepines again- he will be going to [facility] for residential rehab tomorrow- he is also going to follow up as outpatient with mental health - I have referred him to [facility] to ensure he continues to be seen by a PCP and is able to obtain prescriptions    Active Problems:  Vomiting and nausea- related to withdrawl -  Has resolved    Essential hypertension, benign - changed Coreg to Metoprolol to  help with anxiety and tremors - He is also on Clonidine but this was started for withdrawal and anxiety and appears to be helping   LFT elevation - related to ETOH abuse and improving with abstinence  Hypokalemia - due to diarrhea- replaced  Diarrhea - Due to anxiety and withdrawal - given PRN Lomotil  Thrombocytopenia - Due to ETOH abuse - hold blood thinners- nadir was 95 -is improving and is 145 today      ROS per HPI     Health Maintenance:   There are no preventive care reminders to display for this patient.   Past Medical History: Patient Active Problem List   Diagnosis Date Noted  . 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 Benzodiazepine withdrawal without complication (HCC) 07/22/2019  . Alcohol dependence with uncomplicated withdrawal (HCC) 07/19/2019  . LFT elevation   . Essential hypertension, benign 05/11/2018  . Flushing 05/11/2018  . Nonintractable headache 05/11/2018  . Erythrocytosis 05/11/2018  . Anxiety 04/14/2011  . Depression 04/14/2011      Social History   reports that he has never smoked. He has never used smokeless tobacco. He reports current alcohol use. He reports previous drug use. Drug: Marijuana.   Family History  family history includes Cancer in his mother; Heart disease in his paternal grandfather and another family member; Hypertension in his paternal grandfather; Stroke in an other family member.   Medications: reviewed and updated   Objective:  Physical Exam BP (!) 172/104   Pulse 92   Temp (!) 97.3 F (36.3 C) (Temporal)   Resp 17   Wt 206 lb 12.8 oz (93.8 kg)   SpO2 97%   BMI 25.85 kg/m  Physical Exam  Constitutional: He is oriented to person, place, and time and well-developed, well-nourished, and in no distress. No distress.  Cardiovascular: Normal rate.  Pulmonary/Chest: Effort normal. No respiratory distress.  Musculoskeletal:         General: Normal range of motion.  Neurological: He is alert and oriented to person, place, and time.  Skin: Skin is warm and dry. He is not diaphoretic.  Approximately 2-3 cm area on right hip that is erythematous. Slightly less than one cm central opening that is draining with yellow pustular substance. Some fluctuance appreciated, no induration. Able to express pus with gentle pressure.   Psychiatric: Affect and judgment normal.        Assessment & Plan:    1. Encounter to establish care  2. Hospital discharge follow-up  3. LFT elevation - Hepatic Function Panel  4. Essential hypertension, benign BP remains elevated. HR can tolerate a dose increase of Metoprolol, this may also have added benefit of helping with anxiety.  Counseled on blood pressure goal of less than 130/80, low-sodium, DASH diet, medication compliance, 150 minutes of moderate intensity exercise per week. Discussed medication compliance, adverse effects. - metoprolol tartrate (LOPRESSOR) 50 MG tablet; Take 1 tablet (50 mg total) by mouth 2 (two) times daily.  Dispense: 180 tablet; Refill: 0  5. Polysubstance dependence including opioid type drug with complication, continuous use (HCC) - hydrOXYzine (ATARAX/VISTARIL) 50 MG tablet; Take 1 tablet (50 mg total) by mouth every 8 (eight) hours as needed for anxiety.  Dispense: 30 tablet; Refill: 0 - traZODone (DESYREL) 100 MG tablet; Take 1 tablet (100 mg total) by mouth at bedtime as needed for sleep.  Dispense: 30 tablet; Refill: 0 - gabapentin (NEURONTIN) 300 MG capsule; Take 1 capsule (300 mg total) by mouth 3 (three) times daily.  Dispense: 90 capsule; Refill: 0 - FLUoxetine (PROZAC) 10 MG capsule; Take 1 capsule (10 mg total) by mouth daily.  Dispense: 30 capsule; Refill: 3 - cloNIDine (CATAPRES) 0.1 MG tablet; Take 1 tablet (0.1 mg total) by mouth 3 (three) times daily. Do not stop suddenly  Dispense: 90 tablet; Refill: 0 - busPIRone (BUSPAR) 10 MG tablet; Take 1  tablet (10 mg total) by mouth 3 (three) times daily.  Dispense: 90 tablet; Refill: 0  6. Abscess Skin lesion appears consistent with draining abscess. Given surrounding skin erythema, will treat with antibiotic. Wound care discussed with patient. Reasons he would need to return for I&D also reviewed.  - doxycycline (VIBRA-TABS) 100 MG tablet; Take 1 tablet (100 mg total) by mouth 2 (two) times daily.  Dispense: 20 tablet; Refill: 0    Phill Myron, D.O. 08/29/2019, 3:05 PM [facility] at [facility]

## 2019-08-29 NOTE — Patient Instructions (Addendum)
Increase your Metoprolol to 50 mg twice per day. You can take two tablets of your 25 mg tablets until you run out. I have sent the new prescription to the pharmacy.      Thank you for choosing Primary Care at [facility] to be your medical home!    Eric Keller was seen by De Hollingshead, DO today.   Eric Keller's primary care provider is [name], DO.   For the best care possible, you should try to see [name], DO whenever you come to the clinic.   We look forward to seeing you again soon!  If you have any questions about your visit today, please call us at [phone] or feel free to reach your primary care provider via MyChart.

## 2019-08-30 LAB — HEPATIC FUNCTION PANEL
ALT: 24 IU/L (ref 0–44)
AST: 22 IU/L (ref 0–40)
Albumin: 4.5 g/dL (ref 4.0–5.0)
Alkaline Phosphatase: 56 IU/L (ref 39–117)
Bilirubin Total: 0.2 mg/dL (ref 0.0–1.2)
Bilirubin, Direct: 0.12 mg/dL (ref 0.00–0.40)
Total Protein: 6.9 g/dL (ref 6.0–8.5)

## 2019-08-30 NOTE — Progress Notes (Signed)
Normal lab letter mailed.

## 2019-10-01 ENCOUNTER — Encounter (HOSPITAL_COMMUNITY): Payer: Self-pay | Admitting: Emergency Medicine

## 2019-10-01 ENCOUNTER — Emergency Department (HOSPITAL_COMMUNITY): Payer: Self-pay

## 2019-10-01 ENCOUNTER — Other Ambulatory Visit: Payer: Self-pay

## 2019-10-01 ENCOUNTER — Ambulatory Visit (HOSPITAL_COMMUNITY)
Admission: RE | Admit: 2019-10-01 | Discharge: 2019-10-01 | Disposition: A | Discharge status: Home / Self Care | Payer: Self-pay | Attending: Psychiatry | Admitting: Psychiatry

## 2019-10-01 ENCOUNTER — Emergency Department (HOSPITAL_COMMUNITY)
Admission: EM | Admit: 2019-10-01 | Discharge: 2019-10-02 | Disposition: A | Discharge status: Other Acute Inpatient Hospital | Payer: Self-pay | Attending: Emergency Medicine | Admitting: Emergency Medicine

## 2019-10-01 DIAGNOSIS — F331 Major depressive disorder, recurrent, moderate: Secondary | ICD-10-CM | POA: Insufficient documentation

## 2019-10-01 DIAGNOSIS — F1310 Sedative, hypnotic or anxiolytic abuse, uncomplicated: Secondary | ICD-10-CM | POA: Insufficient documentation

## 2019-10-01 DIAGNOSIS — Z20822 Contact with and (suspected) exposure to covid-19: Secondary | ICD-10-CM | POA: Insufficient documentation

## 2019-10-01 DIAGNOSIS — F1010 Alcohol abuse, uncomplicated: Secondary | ICD-10-CM | POA: Insufficient documentation

## 2019-10-01 DIAGNOSIS — Z79899 Other long term (current) drug therapy: Secondary | ICD-10-CM | POA: Insufficient documentation

## 2019-10-01 DIAGNOSIS — F1920 Other psychoactive substance dependence, uncomplicated: Secondary | ICD-10-CM | POA: Insufficient documentation

## 2019-10-01 DIAGNOSIS — I10 Essential (primary) hypertension: Secondary | ICD-10-CM | POA: Insufficient documentation

## 2019-10-01 DIAGNOSIS — R45851 Suicidal ideations: Secondary | ICD-10-CM | POA: Insufficient documentation

## 2019-10-01 LAB — CBC
HCT: 54.1 % — ABNORMAL HIGH (ref 39.0–52.0)
Hemoglobin: 16.8 g/dL (ref 13.0–17.0)
MCH: 33.2 pg (ref 26.0–34.0)
MCHC: 31.1 g/dL (ref 30.0–36.0)
MCV: 106.9 fL — ABNORMAL HIGH (ref 80.0–100.0)
Platelets: 259 10*3/uL (ref 150–400)
RBC: 5.06 MIL/uL (ref 4.22–5.81)
RDW: 14.3 % (ref 11.5–15.5)
WBC: 9.1 10*3/uL (ref 4.0–10.5)
nRBC: 0 % (ref 0.0–0.2)

## 2019-10-01 LAB — COMPREHENSIVE METABOLIC PANEL
ALT: 32 U/L (ref 0–44)
AST: 28 U/L (ref 15–41)
Albumin: 3.8 g/dL (ref 3.5–5.0)
Alkaline Phosphatase: 78 U/L (ref 38–126)
Anion gap: 14 (ref 5–15)
BUN: <5 mg/dL — ABNORMAL LOW (ref 6–20)
CO2: 26 mmol/L (ref 22–32)
Calcium: 9.4 mg/dL (ref 8.9–10.3)
Chloride: 98 mmol/L (ref 98–111)
Creatinine, Ser: 1.05 mg/dL (ref 0.61–1.24)
GFR calc Af Amer: >60 mL/min (ref >60)
GFR calc non Af Amer: >60 mL/min (ref >60)
Glucose, Bld: 91 mg/dL (ref 70–99)
Potassium: 3.9 mmol/L (ref 3.5–5.1)
Sodium: 138 mmol/L (ref 135–145)
Total Bilirubin: 0.9 mg/dL (ref 0.3–1.2)
Total Protein: 7.5 g/dL (ref 6.5–8.1)

## 2019-10-01 LAB — ACETAMINOPHEN LEVEL: Acetaminophen (Tylenol), Serum: <10 ug/mL — ABNORMAL LOW (ref 10–30)

## 2019-10-01 LAB — RAPID URINE DRUG SCREEN, HOSP PERFORMED
Amphetamines: NOT DETECTED
Barbiturates: NOT DETECTED
Benzodiazepines: POSITIVE — AB
Cocaine: NOT DETECTED
Opiates: NOT DETECTED
Tetrahydrocannabinol: POSITIVE — AB

## 2019-10-01 LAB — TROPONIN I (HIGH SENSITIVITY): Troponin I (High Sensitivity): 4 ng/L (ref <18)

## 2019-10-01 LAB — ETHANOL: Alcohol, Ethyl (B): <10 mg/dL (ref <10)

## 2019-10-01 LAB — SALICYLATE LEVEL: Salicylate Lvl: <7 mg/dL — ABNORMAL LOW (ref 7.0–30.0)

## 2019-10-01 NOTE — BH Assessment (Signed)
Assessment Note  Eric Keller is an 34 y.o. male that presents this date voluntary with S/I. Patient voices a plan to "run his car off a embankment." Patient denies any H/I or AVH. Patient has a history of alcoholism, opiate, benzodiazepine abuse/dependence and hypertension. Patient denies having a current OP provider although reports he was diagnosed with depression by his PCP over 5 years ago. Patient reports he has been prescribed various medications for symptom management although states he has never been compliant due to "always relapsing." Patient reports ongoing symptoms of depression to include: feeling useless, excessive fatigue and isolating. Patient was seen on 07/19/19 when he presented for tremors associated with alcohol withdrawals. Patient stated on discharge he did not follow up with referrals provided due to patient relapsing soon after he was released. Patient denies any prior attempts or gestures at self harm.  Patient reports daily use of alcohol stating he consumes up to one pint daily of liquor with last use prior to arrival when patient reported he drank "about three quarters of a pint." Patient also reports a ongoing history of benzodiazepine use stating he takes 6 to 10 mg of Xanax daily with last use over 48 hours ago when he reported he took 6 to 8 mg of that substance. Patient states he also uses various opioids to include Oxycodone up to 30 mg daily with last use over 48 hours ago when he reported he nasally ingested 20 mg of Roxicodone. He also uses heroin intermittently although he cannot recall last use. Patient reports he sometimes uses opiates intravenously or "snorts them." He reports he has not had anything to eat in a couple of days, he states that his substance abuse has been uncontrolled and worsened over the last couple of months due to multiple stressors to include lack of support and financial issues. Patient states he currently resides with his father although is unsure  if he can return to that reside due to his father "being done with him." He reports current withdrawals to include: dry mouth, shaking and tremors. Patient denies any history of abuse or current legal issues. Patient is oriented x 4 and speaks in a normal volume with clear tone. Patient presents as depressed with fair eye contact. Patient's memory is intact and thoughts organized. Patient does not appear to be responding to internal stimuli. Case was staffed with Rankin NP who recommended a inpatient admission for stabilization. Patient will be sent to [facility] for medical clearance due to withdrawals.   .  Diagnosis: F33.2 MDD recurrent without psychotic features, severe, Polysubstance abuse    Past Medical History:  Past Medical History:  Diagnosis Date  . Alcohol withdrawal ([place]) 07/20/2019  . Anxiety   . Anxiety   . Depression   . H/O: substance abuse ([place])   . Hypertension     Past Surgical History:  Procedure Laterality Date  . WISDOM TOOTH EXTRACTION      Family History:  Family History  Problem Relation Age of Onset  . Cancer Mother   . Hypertension Paternal Grandfather   . Heart disease Paternal Grandfather        MIs  . Heart disease Other   . Stroke Other     Social History:  reports that he has never smoked. He has never used smokeless tobacco. He reports current alcohol use. He reports previous drug use. Drug: Marijuana.  Additional Social History:  Alcohol / Drug Use Pain Medications: See MAR Prescriptions: See MAR Over the Counter: See  MAR History of alcohol / drug use?: Yes Longest period of sobriety (when/how long): 4 years Negative Consequences of Use: Personal relationships Withdrawal Symptoms: Agitation, Irritability Substance #1 Name of Substance 1: Benzodiazapines Substance #2 Name of Substance 2: Alcohol 2 - Age of First Use: 15 2 - Amount (size/oz): 1 pint daily 2 - Frequency: Daily 2 - Duration: Ongoing 2 - Last Use / Amount: 10/01/19 1  pint Substance #3 Name of Substance 3: Opiates 3 - Age of First Use: 23 3 - Amount (size/oz): Varies 3 - Frequency: Daily 3 - Duration: Ongoing 3 - Last Use / Amount: 09/29/19 30 mg Oxycotin  CIWA: CIWA-Ar BP: (!) 151/106 Pulse Rate: 88 COWS:    Allergies: No Known Allergies  Home Medications: (Not in a hospital admission)   OB/GYN Status:  No LMP for male patient.  General Assessment Data Location of Assessment: [facility] Assessment Services TTS Assessment: In system Is this a Tele or Face-to-Face Assessment?: Face-to-Face Is this an Initial Assessment or a Re-assessment for this encounter?: Initial Assessment Patient Accompanied by:: N/A Language Other than English: No Living Arrangements: Other (Comment)(Father) What gender do you identify as?: Male Marital status: Single Living Arrangements: Parent Can pt return to current living arrangement?: Yes Admission Status: Voluntary Is patient capable of signing voluntary admission?: Yes Referral Source: Self/Family/Friend Insurance type: SP     Crisis Care Plan Living Arrangements: Parent Legal Guardian: (NA) Name of Psychiatrist: None Name of Therapist: None  Education Status Is patient currently in school?: No Is the patient employed, unemployed or receiving disability?: Employed  Risk to self with the past 6 months Suicidal Ideation: Yes-Currently Present Has patient been a risk to self within the past 6 months prior to admission? : No Suicidal Intent: Yes-Currently Present Has patient had any suicidal intent within the past 6 months prior to admission? : No Is patient at risk for suicide?: Yes Suicidal Plan?: Yes-Currently Present Has patient had any suicidal plan within the past 6 months prior to admission? : No Specify Current Suicidal Plan: Run car off embankment Access to Means: Yes Specify Access to Suicidal Means: Pt has car What has been your use of drugs/alcohol within the last 12 months?: Current  use Previous Attempts/Gestures: No How many times?: 0 Other Self Harm Risks: (Excessive SA use) Triggers for Past Attempts: (NA) Intentional Self Injurious Behavior: None Family Suicide History: No Recent stressful life event(s): Other (Comment)(Ongoing SA issues) Persecutory voices/beliefs?: No Depression: Yes Depression Symptoms: Feeling worthless/self pity Substance abuse history and/or treatment for substance abuse?: Yes Suicide prevention information given to non-admitted patients: Not applicable  Risk to Others within the past 6 months Homicidal Ideation: No Does patient have any lifetime risk of violence toward others beyond the six months prior to admission? : No Thoughts of Harm to Others: No Current Homicidal Intent: No Current Homicidal Plan: No Access to Homicidal Means: No Identified Victim: n History of harm to others?: No Assessment of Violence: None Noted Violent Behavior Description: n Does patient have access to weapons?: No Criminal Charges Pending?: No Does patient have a court date: No Is patient on probation?: No  Psychosis Hallucinations: None noted Delusions: None noted  Mental Status Report Appearance/Hygiene: Unremarkable Eye Contact: Fair Motor Activity: Freedom of movement Speech: Logical/coherent Level of Consciousness: Alert Mood: Depressed Affect: Appropriate to circumstance Anxiety Level: Minimal Thought Processes: Coherent, Relevant Judgement: Partial Orientation: Person, Place, Time Obsessive Compulsive Thoughts/Behaviors: None  Cognitive Functioning Concentration: Normal Memory: Recent Intact, Remote Intact Is patient IDD:  No Insight: Fair Impulse Control: Fair Appetite: Good Have you had any weight changes? : No Change Sleep: No Change Total Hours of Sleep: 7 Vegetative Symptoms: None  ADLScreening [facility] Assessment Services) Patient's cognitive ability adequate to safely complete daily activities?: Yes Patient able to  express need for assistance with ADLs?: Yes Independently performs ADLs?: Yes (appropriate for developmental age)  Prior Inpatient Therapy Prior Inpatient Therapy: Yes Prior Therapy Dates: 2021 Prior Therapy Facilty/Provider(s): [facility] Reason for Treatment: MH issues  Prior Outpatient Therapy Prior Outpatient Therapy: No Does patient have an ACCT team?: No Does patient have Intensive In-House Services?  : No Does patient have [facility] services? : No Does patient have P4CC services?: No  ADL Screening (condition at time of admission) Patient's cognitive ability adequate to safely complete daily activities?: Yes Is the patient deaf or have difficulty hearing?: No Does the patient have difficulty seeing, even when wearing glasses/contacts?: No Does the patient have difficulty concentrating, remembering, or making decisions?: No Patient able to express need for assistance with ADLs?: Yes Does the patient have difficulty dressing or bathing?: No Independently performs ADLs?: Yes (appropriate for developmental age) Does the patient have difficulty walking or climbing stairs?: No Weakness of Legs: None Weakness of Arms/Hands: None  Home Assistive Devices/Equipment Home Assistive Devices/Equipment: None  Therapy Consults (therapy consults require a physician order) PT Evaluation Needed: No OT Evalulation Needed: No SLP Evaluation Needed: No Abuse/Neglect Assessment (Assessment to be complete while patient is alone) Abuse/Neglect Assessment Can Be Completed: Yes Physical Abuse: Denies Verbal Abuse: Denies Sexual Abuse: Denies Exploitation of patient/patient's resources: Denies Self-Neglect: Denies Values / Beliefs Cultural Requests During Hospitalization: None Spiritual Requests During Hospitalization: None Consults Spiritual Care Consult Needed: No Transition of Care Team Consult Needed: No Advance Directives (For Healthcare) Does Patient Have a Medical Advance Directive?:  No Would patient like information on creating a medical advance directive?: No - Patient declined          Disposition: Case was staffed with Rankin NP who recommended a inpatient admission for stabilization. Patient will be sent to [facility] for medical clearance due to withdrawals.    Disposition Initial Assessment Completed for this Encounter: Yes Disposition of Patient: Admit  On Site Evaluation by:   Reviewed with Physician:    Alfredia Ferguson 10/01/2019 5:22 PM

## 2019-10-01 NOTE — ED Provider Notes (Signed)
[facility] DEPT Provider Note   CSN: [id number] Arrival date & time: 10/01/19  1815     History Chief Complaint  Patient presents with  . Suicidal  . detox    Cambell Stanek is a 34 y.o. male.  Patient is a 34 year old male with history of alcohol dependence, anxiety, depression, and hypertension.  He presents today for evaluation of depression, benzodiazepine abuse, and suicidal ideation.  Patient was hospitalized recently and was started on Prozac for depression.  This seemed to help for a short period of time, and however he is now having thoughts of worthlessness and feeling as though those around him would be better off without him.  He also reports consuming alcohol daily and is requesting help with this as well.  The history is provided by the patient.       Past Medical History:  Diagnosis Date  . Alcohol withdrawal ([place]) 07/20/2019  . Anxiety   . Anxiety   . Depression   . H/O: substance abuse ([place])   . Hypertension     Patient Active Problem List   Diagnosis Date Noted  . Polysubstance dependence including opioid type drug with complication, continuous use ([place]) 07/22/2019  . Benzodiazepine withdrawal without complication ([place]) 88/82/8003  . Alcohol dependence with uncomplicated withdrawal ([place]) 07/19/2019  . LFT elevation   . Essential hypertension, benign 05/11/2018  . [place]ushing 05/11/2018  . Nonintractable headache 05/11/2018  . Erythrocytosis 05/11/2018  . Anxiety 04/14/2011  . Depression 04/14/2011    Past Surgical History:  Procedure Laterality Date  . WISDOM TOOTH EXTRACTION         Family History  Problem Relation Age of Onset  . Cancer Mother   . Hypertension Paternal Grandfather   . Heart disease Paternal Grandfather        MIs  . Heart disease Other   . Stroke Other     Social History   Tobacco Use  . Smoking status: Never Smoker  . Smokeless tobacco: Never Used  Substance Use Topics  . Alcohol  use: Yes    Comment: daily 5th of liquor  . Drug use: Not Currently    Types: Marijuana    Comment: Vicodin Abuse, BEnzos    Home Medications Prior to Admission medications   Medication Sig Start Date End Date Taking? Authorizing Provider  busPIRone (BUSPAR) 10 MG tablet Take 1 tablet (10 mg total) by mouth 3 (three) times daily. 08/29/19   Nicolette Bang, DO  Cholecalciferol (VITAMIN D3) 10 MCG (400 UNIT) CAPS Take 1 tablet by mouth daily. 07/27/19   Debbe Odea, MD  cloNIDine (CATAPRES) 0.1 MG tablet Take 1 tablet (0.1 mg total) by mouth 3 (three) times daily. Do not stop suddenly 08/29/19   Nicolette Bang, DO  doxycycline (VIBRA-TABS) 100 MG tablet Take 1 tablet (100 mg total) by mouth 2 (two) times daily. 08/29/19   Nicolette Bang, DO  FLUoxetine (PROZAC) 10 MG capsule Take 1 capsule (10 mg total) by mouth daily. 08/29/19   Nicolette Bang, DO  gabapentin (NEURONTIN) 300 MG capsule Take 1 capsule (300 mg total) by mouth 3 (three) times daily. 08/29/19   Nicolette Bang, DO  hydrOXYzine (ATARAX/VISTARIL) 50 MG tablet Take 1 tablet (50 mg total) by mouth every 8 (eight) hours as needed for anxiety. 08/29/19   Nicolette Bang, DO  metoprolol tartrate (LOPRESSOR) 50 MG tablet Take 1 tablet (50 mg total) by mouth 2 (two) times daily. 08/29/19   Juleen China,  Kandee Keen, DO  thiamine 100 MG tablet Take 1 tablet (100 mg total) by mouth daily. 07/27/19   Calvert Cantor, MD  traZODone (DESYREL) 100 MG tablet Take 1 tablet (100 mg total) by mouth at bedtime as needed for sleep. 08/29/19   Arvilla Market, DO    Allergies    Patient has no known allergies.  Review of Systems   Review of Systems  All other systems reviewed and are negative.   Physical Exam Updated Vital Signs BP (!) 154/91   Pulse 76   Temp 98.1 F (36.7 C) (Oral)   Resp 18   SpO2 100%   Physical Exam Vitals and nursing note reviewed.  Constitutional:       General: He is not in acute distress.    Appearance: He is well-developed. He is not diaphoretic.  HENT:     Head: Normocephalic and atraumatic.  Cardiovascular:     Rate and Rhythm: Normal rate and regular rhythm.     Heart sounds: No murmur. No friction rub.  Pulmonary:     Effort: Pulmonary effort is normal. No respiratory distress.     Breath sounds: Normal breath sounds. No wheezing or rales.  Abdominal:     General: Bowel sounds are normal. There is no distension.     Palpations: Abdomen is soft.     Tenderness: There is no abdominal tenderness.  Musculoskeletal:        General: Normal range of motion.     Cervical back: Normal range of motion and neck supple.  Skin:    General: Skin is warm and dry.  Neurological:     Mental Status: He is alert and oriented to person, place, and time.     Coordination: Coordination normal.     ED Results / Procedures / Treatments   Labs (all labs ordered are listed, but only abnormal results are displayed) Labs Reviewed  COMPREHENSIVE METABOLIC PANEL  ETHANOL  SALICYLATE LEVEL  ACETAMINOPHEN LEVEL  CBC  RAPID URINE DRUG SCREEN, HOSP PERFORMED  TROPONIN I (HIGH SENSITIVITY)  TROPONIN I (HIGH SENSITIVITY)    EKG EKG Interpretation  Date/Time:  Monday October 01 2019 18:57:11 EDT Ventricular Rate:  69 PR Interval:    QRS Duration: 105 QT Interval:  404 QTC Calculation: 433 R Axis:   70 Text Interpretation: Sinus rhythm Consider left atrial enlargement Probable LVH with secondary repol abnrm Baseline wander in lead(s) V1 No significant change since 07/28/2019 Confirmed by Geoffery Lyons ([id number]) on 10/01/2019 7:10:14 PM   Radiology DG Chest 2 View  Result Date: 10/01/2019 CLINICAL DATA:  Chest pain, hypertension, history of substance abuse EXAM: CHEST - 2 VIEW COMPARISON:  07/19/2019 FINDINGS: The heart size and mediastinal contours are within normal limits. Both lungs are clear. The visualized skeletal structures are  unremarkable. IMPRESSION: No active cardiopulmonary disease. Electronically Signed   By: Sharlet Salina M.D.   On: 10/01/2019 19:27    Procedures Procedures (including critical care time)  Medications Ordered in ED Medications - No data to display  ED Course  I have reviewed the triage vital signs and the nursing notes.  Pertinent labs & imaging results that were available during my care of the patient were reviewed by me and considered in my medical decision making (see chart for details).    MDM Rules/Calculators/A&P  Patient presenting with complaints of depression and requesting help with both alcohol and benzodiazepine abuse.  Laboratory studies are essentially unremarkable and patient appears medically cleared for psychiatric  evaluation.  TTS has been consulted and will assist in determining the final disposition.  Final Clinical Impression(s) / ED Diagnoses Final diagnoses:  None    Rx / DC Orders ED Discharge Orders    None       Geoffery Lyons, MD 10/02/19 0041

## 2019-10-01 NOTE — ED Triage Notes (Signed)
Pt reports SI x 4 days without a plan. Was put on Prozac and seemed to help for a couple works and then got worse. Reports that he started using ETOH and benzos again. Last use of ETOH was this morning around 4am Last use Benzos was day and hald ago.  ETOH average pint of liquor a day.  Pt c/o chest pains during triage.

## 2019-10-01 NOTE — BHH Counselor (Signed)
Pt was assessed as a walk-in at [facility] another TTS assessment is not needed at this time.     Redmond Pulling, MS, [facility], [facility] Triage Specialist [phone]

## 2019-10-01 NOTE — BH Assessment (Signed)
BHH Assessment Progress Note  Case was staffed with Rankin NP who recommended a inpatient admission for stabilization. Patient will be sent to [facility] for medical clearance due to withdrawals.

## 2019-10-01 NOTE — ED Notes (Signed)
Pt provided with glass of water.

## 2019-10-01 NOTE — H&P (Signed)
Behavioral Health Medical Screening Exam  Eric Keller is an 34 y.o. male patient presents as walk in at [facility] requesting detox from benzodiazepines, opiates, and alcohol.  6-8 mg Xanax or klonopin daily, 60 mg Roxy daily and 1 pt alcohol daily.  History of DT's and seizures with withdrawal.  Patient also endorses suicidal ideation with plan to overdose.  States he wants to get his life together.  Patient unable to contract for safety.  Patient sent to [facility] ED for medical clearance related to history of seizure and DTs with withdrawal.  Recommended for inpatient psychiatric treatment.    Total Time spent with patient: 30 minutes  Psychiatric Specialty Exam: Physical Exam  Vitals reviewed. Constitutional: He is oriented to person, place, and time.  Respiratory: Effort normal.  Musculoskeletal:        General: Normal range of motion.     Cervical back: Normal range of motion.  Neurological: He is alert and oriented to person, place, and time.  Skin: He is diaphoretic.  Psychiatric: His behavior is normal. His mood appears anxious. Thought content is not paranoid and not delusional. Cognition and memory are normal. He expresses impulsivity. He exhibits a depressed mood. He expresses suicidal ideation. He expresses no homicidal ideation. He expresses suicidal plans.    Review of Systems  Psychiatric/Behavioral: Positive for suicidal ideas. Negative for hallucinations. The patient is nervous/anxious.        Patient states that he take 6-8 mg of Xanax or klonopin a day, 60 mg of Roxy, and 1 pt of liquor daily.  States that he has a history of DTs and seizures with withdrawal.    All other systems reviewed and are negative.   Blood pressure (!) 151/106, pulse 88, temperature 97.7 F (36.5 C), temperature source Oral, resp. rate 20, SpO2 100 %.There is no height or weight on file to calculate BMI.  General Appearance: Casual  Eye Contact:  Good  Speech:  Clear and Coherent and Normal Rate   Volume:  Normal  Mood:  Depressed  Affect:  Congruent  Thought Process:  Coherent, Goal Directed and Descriptions of Associations: Intact  Orientation:  Full (Time, Place, and Person)  Thought Content:  WDL  Suicidal Thoughts:  Yes.  with intent/plan  Homicidal Thoughts:  No  Memory:  Immediate;   Good Recent;   Good  Judgement:  Fair  Insight:  Fair  Psychomotor Activity:  Normal  Concentration: Concentration: Good and Attention Span: Good  Recall:  Good  Fund of Knowledge:Good  Language: Good  Akathisia:  No  Handed:  Right  AIMS (if indicated):     Assets:  Communication Skills Desire for Improvement Housing Social Support  Sleep:       Musculoskeletal: Strength & Muscle Tone: within normal limits Gait & Station: normal Patient leans: N/A  Blood pressure (!) 151/106, pulse 88, temperature 97.7 F (36.5 C), temperature source Oral, resp. rate 20, SpO2 100 %.  Recommendations:  Inpatient psychiatric treatment  Based on my evaluation the patient appears to have an emergency medical condition for which I recommend the patient be transferred to the emergency department for further evaluation.   Spoke with Dr. Donnald Garre informed sending patient for medical clearance related to withdrawal history.   Eric Bushart, NP 10/01/2019, 5:53 PM

## 2019-10-01 NOTE — ED Notes (Signed)
Pt dressed out in burgundy scrubs, wanded by security, and clothing placed in belongings bag labeled at nurses station. Pt reports  " my cell phone, wallet, keys were taken by [facility]"

## 2019-10-02 ENCOUNTER — Encounter (HOSPITAL_COMMUNITY): Payer: Self-pay | Admitting: Registered Nurse

## 2019-10-02 ENCOUNTER — Inpatient Hospital Stay (HOSPITAL_COMMUNITY)
Admission: AD | Admit: 2019-10-02 | Discharge: 2019-10-09 | DRG: 885 | Disposition: A | Discharge status: Home / Self Care | Payer: Federal, State, Local not specified - Other | Source: Intra-hospital | Attending: Psychiatry | Admitting: Psychiatry

## 2019-10-02 DIAGNOSIS — F411 Generalized anxiety disorder: Secondary | ICD-10-CM | POA: Diagnosis present

## 2019-10-02 DIAGNOSIS — F1920 Other psychoactive substance dependence, uncomplicated: Secondary | ICD-10-CM

## 2019-10-02 DIAGNOSIS — F10239 Alcohol dependence with withdrawal, unspecified: Secondary | ICD-10-CM | POA: Diagnosis present

## 2019-10-02 DIAGNOSIS — Z823 Family history of stroke: Secondary | ICD-10-CM

## 2019-10-02 DIAGNOSIS — F1123 Opioid dependence with withdrawal: Secondary | ICD-10-CM | POA: Diagnosis present

## 2019-10-02 DIAGNOSIS — Z8249 Family history of ischemic heart disease and other diseases of the circulatory system: Secondary | ICD-10-CM | POA: Diagnosis not present

## 2019-10-02 DIAGNOSIS — F332 Major depressive disorder, recurrent severe without psychotic features: Secondary | ICD-10-CM | POA: Diagnosis present

## 2019-10-02 DIAGNOSIS — F13239 Sedative, hypnotic or anxiolytic dependence with withdrawal, unspecified: Secondary | ICD-10-CM | POA: Diagnosis present

## 2019-10-02 DIAGNOSIS — G4700 Insomnia, unspecified: Secondary | ICD-10-CM | POA: Diagnosis present

## 2019-10-02 DIAGNOSIS — I10 Essential (primary) hypertension: Secondary | ICD-10-CM | POA: Diagnosis present

## 2019-10-02 DIAGNOSIS — F1020 Alcohol dependence, uncomplicated: Secondary | ICD-10-CM | POA: Diagnosis present

## 2019-10-02 DIAGNOSIS — R45851 Suicidal ideations: Secondary | ICD-10-CM | POA: Diagnosis present

## 2019-10-02 LAB — URINALYSIS, ROUTINE W REFLEX MICROSCOPIC
Bilirubin Urine: NEGATIVE
Glucose, UA: NEGATIVE mg/dL
Hgb urine dipstick: NEGATIVE
Ketones, ur: 5 mg/dL — AB
Leukocytes,Ua: NEGATIVE
Nitrite: NEGATIVE
Protein, ur: NEGATIVE mg/dL
Specific Gravity, Urine: 1.009 (ref 1.005–1.030)
pH: 6 (ref 5.0–8.0)

## 2019-10-02 LAB — SARS CORONAVIRUS 2 (TAT 6-24 HRS): SARS Coronavirus 2: NEGATIVE

## 2019-10-02 LAB — TROPONIN I (HIGH SENSITIVITY): Troponin I (High Sensitivity): 6 ng/L (ref <18)

## 2019-10-02 MED ORDER — CHOLECALCIFEROL 10 MCG (400 UNIT) PO TABS
400.0000 [IU] | ORAL_TABLET | Freq: Every day | ORAL | Status: DC
  Administered 2019-10-02: 400 [IU] via ORAL
  Filled 2019-10-02: qty 1

## 2019-10-02 MED ORDER — ACETAMINOPHEN 325 MG PO TABS
650.0000 mg | ORAL_TABLET | Freq: Four times a day (QID) | ORAL | Status: DC | PRN
  Administered 2019-10-02 – 2019-10-03 (×2): 650 mg via ORAL
  Filled 2019-10-02 (×2): qty 2

## 2019-10-02 MED ORDER — HYDROXYZINE HCL 25 MG PO TABS
50.0000 mg | ORAL_TABLET | Freq: Three times a day (TID) | ORAL | Status: DC | PRN
  Administered 2019-10-02 (×2): 50 mg via ORAL
  Filled 2019-10-02 (×2): qty 2

## 2019-10-02 MED ORDER — LORAZEPAM 1 MG PO TABS
2.0000 mg | ORAL_TABLET | Freq: Once | ORAL | Status: AC
  Administered 2019-10-02: 13:00:00 2 mg via ORAL
  Filled 2019-10-02: qty 2

## 2019-10-02 MED ORDER — BUSPIRONE HCL 10 MG PO TABS
10.0000 mg | ORAL_TABLET | Freq: Three times a day (TID) | ORAL | Status: DC
  Administered 2019-10-03 (×2): 10 mg via ORAL
  Filled 2019-10-02 (×7): qty 1

## 2019-10-02 MED ORDER — CLONIDINE HCL 0.1 MG PO TABS
0.1000 mg | ORAL_TABLET | Freq: Three times a day (TID) | ORAL | Status: DC
  Administered 2019-10-02 (×2): 0.1 mg via ORAL
  Filled 2019-10-02 (×2): qty 1

## 2019-10-02 MED ORDER — BUSPIRONE HCL 10 MG PO TABS
10.0000 mg | ORAL_TABLET | Freq: Three times a day (TID) | ORAL | Status: DC
  Administered 2019-10-02 (×2): 10 mg via ORAL
  Filled 2019-10-02 (×2): qty 1

## 2019-10-02 MED ORDER — THIAMINE HCL 100 MG PO TABS
100.0000 mg | ORAL_TABLET | Freq: Every day | ORAL | Status: DC
  Administered 2019-10-03: 09:00:00 100 mg via ORAL
  Filled 2019-10-02 (×2): qty 1

## 2019-10-02 MED ORDER — THIAMINE HCL 100 MG PO TABS
100.0000 mg | ORAL_TABLET | Freq: Every day | ORAL | Status: DC
  Administered 2019-10-02: 10:00:00 100 mg via ORAL
  Filled 2019-10-02: qty 1

## 2019-10-02 MED ORDER — CHOLECALCIFEROL 10 MCG (400 UNIT) PO TABS
400.0000 [IU] | ORAL_TABLET | Freq: Every day | ORAL | Status: DC
  Administered 2019-10-03 – 2019-10-09 (×7): 400 [IU] via ORAL
  Filled 2019-10-02 (×3): qty 1
  Filled 2019-10-02: qty 14
  Filled 2019-10-02 (×6): qty 1

## 2019-10-02 MED ORDER — HYDROXYZINE HCL 50 MG PO TABS
50.0000 mg | ORAL_TABLET | Freq: Three times a day (TID) | ORAL | Status: DC | PRN
  Administered 2019-10-02 – 2019-10-03 (×2): 50 mg via ORAL
  Filled 2019-10-02 (×3): qty 1

## 2019-10-02 MED ORDER — METOPROLOL TARTRATE 50 MG PO TABS
50.0000 mg | ORAL_TABLET | Freq: Two times a day (BID) | ORAL | Status: DC
  Administered 2019-10-02 – 2019-10-09 (×14): 50 mg via ORAL
  Filled 2019-10-02: qty 1
  Filled 2019-10-02: qty 28
  Filled 2019-10-02: qty 2
  Filled 2019-10-02: qty 1
  Filled 2019-10-02: qty 28
  Filled 2019-10-02 (×14): qty 1

## 2019-10-02 MED ORDER — ALUM & MAG HYDROXIDE-SIMETH 200-200-20 MG/5ML PO SUSP: 30.0000 mL | ORAL | Status: DC | PRN

## 2019-10-02 MED ORDER — LORAZEPAM 1 MG PO TABS
2.0000 mg | ORAL_TABLET | Freq: Once | ORAL | Status: AC
  Administered 2019-10-02: 2 mg via ORAL
  Filled 2019-10-02: qty 2

## 2019-10-02 MED ORDER — LORAZEPAM 1 MG PO TABS
1.0000 mg | ORAL_TABLET | Freq: Once | ORAL | Status: AC
  Administered 2019-10-02: 1 mg via ORAL
  Filled 2019-10-02: qty 1

## 2019-10-02 MED ORDER — CLONIDINE HCL 0.1 MG PO TABS
0.1000 mg | ORAL_TABLET | Freq: Three times a day (TID) | ORAL | Status: DC
  Administered 2019-10-03 – 2019-10-09 (×18): 0.1 mg via ORAL
  Filled 2019-10-02: qty 42
  Filled 2019-10-02 (×8): qty 1
  Filled 2019-10-02: qty 42
  Filled 2019-10-02 (×19): qty 1
  Filled 2019-10-02: qty 42

## 2019-10-02 MED ORDER — FLUOXETINE HCL 10 MG PO CAPS
10.0000 mg | ORAL_CAPSULE | Freq: Every day | ORAL | Status: DC
  Administered 2019-10-03 – 2019-10-06 (×4): 10 mg via ORAL
  Filled 2019-10-02 (×5): qty 1

## 2019-10-02 MED ORDER — TRAZODONE HCL 100 MG PO TABS
100.0000 mg | ORAL_TABLET | Freq: Every evening | ORAL | Status: DC | PRN
  Administered 2019-10-02: 100 mg via ORAL
  Filled 2019-10-02: qty 1

## 2019-10-02 MED ORDER — GABAPENTIN 300 MG PO CAPS
300.0000 mg | ORAL_CAPSULE | Freq: Three times a day (TID) | ORAL | Status: DC
  Administered 2019-10-02 (×2): 300 mg via ORAL
  Filled 2019-10-02 (×2): qty 1

## 2019-10-02 MED ORDER — TRAZODONE HCL 100 MG PO TABS
100.0000 mg | ORAL_TABLET | Freq: Every evening | ORAL | Status: DC | PRN
  Administered 2019-10-02: 22:00:00 100 mg via ORAL
  Filled 2019-10-02: qty 1

## 2019-10-02 MED ORDER — MAGNESIUM HYDROXIDE 400 MG/5ML PO SUSP: 30.0000 mL | Freq: Every day | ORAL | Status: DC | PRN

## 2019-10-02 MED ORDER — METOPROLOL TARTRATE 25 MG PO TABS
50.0000 mg | ORAL_TABLET | Freq: Two times a day (BID) | ORAL | Status: DC
  Administered 2019-10-02 (×2): 50 mg via ORAL
  Filled 2019-10-02 (×3): qty 2

## 2019-10-02 MED ORDER — FLUOXETINE HCL 10 MG PO CAPS
10.0000 mg | ORAL_CAPSULE | Freq: Every day | ORAL | Status: DC
  Administered 2019-10-02: 10 mg via ORAL
  Filled 2019-10-02: qty 1

## 2019-10-02 MED ORDER — GABAPENTIN 300 MG PO CAPS
300.0000 mg | ORAL_CAPSULE | Freq: Three times a day (TID) | ORAL | Status: DC
  Administered 2019-10-03 – 2019-10-09 (×19): 300 mg via ORAL
  Filled 2019-10-02: qty 42
  Filled 2019-10-02 (×11): qty 1
  Filled 2019-10-02: qty 42
  Filled 2019-10-02: qty 1
  Filled 2019-10-02: qty 42
  Filled 2019-10-02 (×11): qty 1

## 2019-10-02 NOTE — ED Notes (Signed)
Pt given pillow and is trying to sleep.

## 2019-10-02 NOTE — ED Notes (Signed)
Pt remains anxious and restless.Pt requesting more Ativan.  [place] MD aware

## 2019-10-02 NOTE — ED Notes (Signed)
Pt moved to [place] [facility],pt is calm at present  Resting after provided with warm blanket. Continue to observe.

## 2019-10-02 NOTE — ED Notes (Signed)
Pt provided with meal tray.

## 2019-10-02 NOTE — ED Notes (Signed)
Attempted to call report, states that nurse is still in report and will call back

## 2019-10-02 NOTE — Patient Outreach (Signed)
CPSS met with the patient at the [facility] in order to provide substance use recovery support and provide information for substance use treatment resources. Patient reports a history of poly substance use with  benzodiazapine use, opioid use, and alcohol use. Patient is interested in a detox referral to address current poly substance use withdrawal symptoms. Patient states that he is uninsured. CPSS called [facility] and they currently do not have male detox beds available. Patient states that he was at [facility] four months ago for detox treatment. Patient was also admitted to [facility] medical floor on 07/19/19 and discharged 07/27/19 to address alcohol and benzodiazapine withdrawal symptoms. CPSS is currently working on a referral to [facility]. CPSS will keep the patient updated on the status of this referral. CPSS also provided several different substance use recovery resource information including detox treatment center list, residential/outpatient substance use treatment center list, [place] online/in-person NA meeting list, and CPSS contact information. CPSS strongly encouraged the patient to continue to stay in contact with CPSS if needed for further help with CPSS substance use recovery related services.

## 2019-10-02 NOTE — ED Notes (Signed)
While scanning 0953 meds, system shut down to reboot. Meds were scanned, did not transmit

## 2019-10-02 NOTE — ED Notes (Signed)
Pt resting in bed at this time. NAD noted °

## 2019-10-02 NOTE — Progress Notes (Signed)
Pt accepted to [facility] 300-02.      Shuvon Rankin, NP is the accepting provider.    Dr. Jama Flavors is the attending provider.    Call report to  [phone].     [email or web address] ED notified.     Pt is Voluntary.    Pt may be transported by [employer].   Pt scheduled to arrive at 19:00PM.   Drucilla Schmidt, MSW, LCSW-A Clinical Disposition Social Worker [employer] [facility]/TTS [phone]

## 2019-10-02 NOTE — ED Notes (Signed)
TTS complete 

## 2019-10-02 NOTE — ED Provider Notes (Signed)
Patient seen this morning.  Patient is awaiting inpatient placement.  Patient is without significant acute complaint.  No significant overnight events reported per nursing.    Wynetta Fines, MD 10/02/19 [phone]

## 2019-10-02 NOTE — BH Assessment (Signed)
[facility] Assessment Progress Note  Per Shuvon Rankin, FNP, this pt does not require psychiatric hospitalization at this time.  Pt is to be discharged from [facility], however, pt would benefit from admission to a facility for detoxification.  A peer support consult has been ordered for pt. If peer support is able to find placement for him before 15:00, pt will go to the designated facility.  Otherwise, pt will be considered for the [facility] Observation Unit.  Pt's nurse has been notified.  Doylene Canning, MA Triage Specialist [phone]

## 2019-10-02 NOTE — Consult Note (Signed)
  Eric Keller, 34 y.o., male patient seen via tele psych by this provider, Dr. Lucianne Muss; and chart reviewed on 10/02/19.  On evaluation Eric Keller reports he continues to have withdrawal symptoms and feels that it is not safe for him to go through withdrawal at home related to history of seizures and DT's with withdrawal.  Patient also continues to endorse suicidal ideation.  Peer support ordered to assist with detox and rehab facility.  [facility] awaiting available bed.  Will continue to seek inpatient psychiatric treatment.  During evaluation Eric Keller is alert/oriented x 4; calm/cooperative; and mood is congruent with affect.  He does not appear to be responding to internal/external stimuli or delusional thoughts.  Patient denies homicidal ideation, psychosis, and paranoia.  Patient answered question appropriately.     Disposition: Recommend psychiatric Inpatient admission when medically cleared.

## 2019-10-02 NOTE — BH Assessment (Signed)
[facility] Assessment Progress Note  Per Shuvon Rankin, FNP, this pt requires psychiatric hospitalization at this time.  [name], RN has assigned pt to [facility] Rm 300-2; [facility] will be ready to receive pt after 19:00.  Pt has signed Voluntary Admission and Consent for Treatment, as well as Consent to Release Information to his PCP, his father and his brother, and signed forms have been faxed to [facility].  Pt's nurse has been notified, and agrees to send original paperwork along with pt via [facility] [employer], and to call report to [phone].  Doylene Canning, [place] Behavioral Health Coordinator [phone]

## 2019-10-02 NOTE — ED Notes (Signed)
Call from [facility], answered medical questions after reviewing chart. They will call back and let staff know when he can come to facility

## 2019-10-02 NOTE — ED Notes (Signed)
Pt ambulated to bathroom, c/o feeling anxious, Atarax 50 mg given, did not eat breakfast due to feeling nauseated. Pt is not pleased he has been in hallway bed.

## 2019-10-02 NOTE — Discharge Summary (Signed)
  Patient to be transferred to [facility] for inpatient psychiatric treatment after 7:00 PM

## 2019-10-02 NOTE — ED Notes (Signed)
Pt ambulated to BR with steady gait.

## 2019-10-02 NOTE — ED Notes (Signed)
Pt provided with ginger ale 

## 2019-10-03 ENCOUNTER — Other Ambulatory Visit: Payer: Self-pay

## 2019-10-03 ENCOUNTER — Encounter (HOSPITAL_COMMUNITY): Payer: Self-pay | Admitting: Registered Nurse

## 2019-10-03 ENCOUNTER — Ambulatory Visit: Payer: Self-pay | Admitting: Internal Medicine

## 2019-10-03 DIAGNOSIS — F332 Major depressive disorder, recurrent severe without psychotic features: Secondary | ICD-10-CM | POA: Diagnosis not present

## 2019-10-03 DIAGNOSIS — F1920 Other psychoactive substance dependence, uncomplicated: Secondary | ICD-10-CM

## 2019-10-03 MED ORDER — HYDROXYZINE HCL 25 MG PO TABS: 25.0000 mg | ORAL_TABLET | Freq: Four times a day (QID) | ORAL | Status: DC | PRN

## 2019-10-03 MED ORDER — ONDANSETRON 4 MG PO TBDP: 4.0000 mg | ORAL_TABLET | Freq: Four times a day (QID) | ORAL | Status: DC | PRN

## 2019-10-03 MED ORDER — LOPERAMIDE HCL 2 MG PO CAPS: 2.0000 mg | ORAL_CAPSULE | ORAL | Status: DC | PRN

## 2019-10-03 MED ORDER — LORAZEPAM 1 MG PO TABS
1.0000 mg | ORAL_TABLET | Freq: Two times a day (BID) | ORAL | Status: DC
  Administered 2019-10-05: 1 mg via ORAL
  Filled 2019-10-03: qty 1

## 2019-10-03 MED ORDER — THIAMINE HCL 100 MG PO TABS
100.0000 mg | ORAL_TABLET | Freq: Every day | ORAL | Status: DC
  Administered 2019-10-03 – 2019-10-09 (×7): 100 mg via ORAL
  Filled 2019-10-03: qty 14
  Filled 2019-10-03 (×11): qty 1

## 2019-10-03 MED ORDER — ADULT MULTIVITAMIN W/MINERALS CH: 1.0000 | ORAL_TABLET | Freq: Every day | ORAL | Status: DC

## 2019-10-03 MED ORDER — LORAZEPAM 1 MG PO TABS: 1.0000 mg | ORAL_TABLET | Freq: Four times a day (QID) | ORAL | Status: DC | PRN

## 2019-10-03 MED ORDER — THIAMINE HCL 100 MG/ML IJ SOLN: 100.0000 mg | Freq: Once | INTRAMUSCULAR | Status: DC

## 2019-10-03 MED ORDER — LORAZEPAM 1 MG PO TABS
1.0000 mg | ORAL_TABLET | Freq: Four times a day (QID) | ORAL | Status: AC
  Administered 2019-10-03 (×3): 1 mg via ORAL
  Filled 2019-10-03 (×3): qty 1

## 2019-10-03 MED ORDER — LORAZEPAM 1 MG PO TABS: 1.0000 mg | ORAL_TABLET | Freq: Every day | ORAL | Status: DC

## 2019-10-03 MED ORDER — THIAMINE HCL 100 MG PO TABS: 100.0000 mg | ORAL_TABLET | Freq: Every day | ORAL | Status: DC

## 2019-10-03 MED ORDER — ADULT MULTIVITAMIN W/MINERALS CH
1.0000 | ORAL_TABLET | Freq: Every day | ORAL | Status: DC
  Administered 2019-10-03 – 2019-10-09 (×7): 1 via ORAL
  Filled 2019-10-03 (×11): qty 1

## 2019-10-03 MED ORDER — TRAZODONE HCL 50 MG PO TABS
50.0000 mg | ORAL_TABLET | Freq: Every evening | ORAL | Status: DC | PRN
  Administered 2019-10-03 – 2019-10-05 (×3): 50 mg via ORAL
  Administered 2019-10-06: 100 mg via ORAL
  Filled 2019-10-03 (×4): qty 1

## 2019-10-03 MED ORDER — LORAZEPAM 1 MG PO TABS
1.0000 mg | ORAL_TABLET | Freq: Three times a day (TID) | ORAL | Status: AC
  Administered 2019-10-04 (×3): 1 mg via ORAL
  Filled 2019-10-03 (×3): qty 1

## 2019-10-03 MED ORDER — LOPERAMIDE HCL 2 MG PO CAPS: 2.0000 mg | ORAL_CAPSULE | ORAL | Status: AC | PRN

## 2019-10-03 MED ORDER — LORAZEPAM 1 MG PO TABS
1.0000 mg | ORAL_TABLET | Freq: Four times a day (QID) | ORAL | Status: AC | PRN
  Administered 2019-10-05 (×2): 1 mg via ORAL
  Filled 2019-10-03 (×2): qty 1

## 2019-10-03 MED ORDER — HYDROXYZINE HCL 25 MG PO TABS
25.0000 mg | ORAL_TABLET | Freq: Four times a day (QID) | ORAL | Status: AC | PRN
  Administered 2019-10-03 – 2019-10-06 (×4): 25 mg via ORAL
  Filled 2019-10-03 (×4): qty 1

## 2019-10-03 NOTE — Progress Notes (Signed)
   10/02/19 2100  Psych Admission Type (Psych Patients Only)  Admission Status Voluntary  Psychosocial Assessment  Patient Complaints Anxiety;Depression;Substance abuse  Eye Contact Fair  Facial Expression Sad  Affect Depressed  Speech Logical/coherent  Interaction Assertive  Motor Activity Slow  Appearance/Hygiene Unremarkable  Behavior Characteristics Cooperative;Anxious  Mood Depressed;Anxious  Thought Process  Coherency WDL  Content WDL  Delusions None reported or observed  Perception WDL  Hallucination None reported or observed  Judgment Poor  Confusion None  Danger to Self  Current suicidal ideation? Denies  Danger to Others  Danger to Others None reported or observed

## 2019-10-03 NOTE — Progress Notes (Signed)
Patient ID: Eric Keller, male   DOB: 03-Oct-1985, 34 y.o.   MRN: [id number] D: Pt here voluntarily from [facility]. Pt denies SI/HI/AVH and pain at this time. Pt says he is depressed d/t numerous relapses into SA. Pt wants to get clean ans stay clean from alcohol, opiate, and benzo abuse. Pt endorses positive social support and part-time employment. Pt lives with his father.  From Assessment Note: Eric Keller is an 34 y.o. male that presents this date voluntary with S/I. Patient voices a plan to "run his car off a embankment." Patient denies any H/I or AVH. Patient has a history of alcoholism, opiate, benzodiazepine abuse/dependence and hypertension. Patient denies having a current OP provider although reports he was diagnosed with depression by his PCP over 5 years ago. Patient reports he has been prescribed various medications for symptom management although states he has never been compliant due to "always relapsing." Patient reports ongoing symptoms of depression to include: feeling useless, excessive fatigue and isolating. Patient was seen on 07/19/19 when he presented for tremors associated with alcohol withdrawals. Patient stated on discharge he did not follow up with referrals provided due to patient relapsing soon after he was released. Patient denies any prior attempts or gestures at self harm.  Patient reports daily use of alcohol stating he consumes up to one pint daily of liquor with last use prior to arrival when patient reported he drank "about three quarters of a pint." Patient also reports a ongoing history of benzodiazepine use stating he takes 6 to 10 mg of Xanax daily with last use over 48 hours ago when he reported he took 6 to 8 mg of that substance. Patient states he also uses various opioids to include Oxycodone up to 30 mg daily with last use over 48 hours ago when he reported he nasally ingested 20 mg of Roxicodone. He also uses heroin intermittently although he cannot recall last use.  Patient reports he sometimes uses opiates intravenously or "snorts them."   A: Pt was offered support and encouragement. Pt is cooperative during assessment. VS assessed and admission paperwork signed. Belongings searched and contraband items placed in locker. Non-invasive skin search completed: pt skin intact. Pt offered food and drink and both refused. Pt has been eating less d/t decreased appetite. Pt introduced to unit milieu by nursing staff. Q 15 minute checks were started for safety.  R: Pt in room. Pt safety maintained on unit.

## 2019-10-03 NOTE — Progress Notes (Signed)
   10/03/19 2024  Psych Admission Type (Psych Patients Only)  Admission Status Voluntary  Psychosocial Assessment  Patient Complaints Anxiety;Depression  Eye Contact Fair  Facial Expression Sad;Anxious  Affect Depressed  Speech Logical/coherent  Interaction Assertive  Motor Activity Slow  Appearance/Hygiene Unremarkable  Behavior Characteristics Anxious  Mood Depressed;Anxious  Thought Process  Coherency WDL  Content WDL  Delusions None reported or observed  Perception Hallucinations  Hallucination Auditory (hears music - getting softer now)  Judgment Poor  Confusion None  Danger to Self  Current suicidal ideation? Denies  Danger to Others  Danger to Others None reported or observed   Pt endorses sweats, shakes, and body aches. Anxiety rated 8/10 and depression 7/10. Body ache pain rated 7/10.

## 2019-10-03 NOTE — Progress Notes (Signed)
[facility] Group Notes:  (Nursing/MHT/Case Management/Adjunct)  Date:  10/03/2019  Time:  2030  Type of Therapy:  wrap up group  Participation Level:  Active  Participation Quality:  Appropriate, Attentive, Sharing and Supportive  Affect:  Blunted  Cognitive:  Appropriate  Insight:  Improving  Engagement in Group:  Engaged  Modes of Intervention:  Clarification, Education and Support  Summary of Progress/Problems: Positive thinking and positive change were discussed.   Marcille Buffy 10/03/2019, 9:24 PM
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
## 2019-10-03 NOTE — BHH Counselor (Signed)
Adult Comprehensive Assessment  Patient ID: Eric Keller, male   DOB: Nov 24, 1985, 34 y.o.   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 Family Relationships: pt reports good familial Sport and exercise psychologist / Lack of resources (include bankruptcy): employed Housing / Lack of housing: lives with dad Physical health (include injuries & life threatening diseases): none reported Substance abuse: Pt reports drinking daily, using benzos and opiates Bereavement / Loss: Pts mother passed when he was 5yo  Living/Environment/Situation:  Living Arrangements: Parent Living conditions (as described by patient or guardian): "pretty good" Who else lives in the home?: Pts dad How long has patient lived in current situation?: 3 years What is atmosphere in current home: Comfortable, Loving  Family History:  Marital status: Single Are you sexually active?: Yes What is your sexual orientation?: heterosexual Does patient have children?: No  Childhood History:  By whom was/is the patient raised?: Grandparents, Father Additional childhood history information: Pt was raised by his father and grandmother, his mother passed away when he was 5yo Description of patient's relationship with caregiver when they were a child: "good, my grandma was my rock" Patient's description of current relationship with people who raised him/her: Pts grandmother passed away 2 years ago, pt reports his relationship with his dad is pretty good, but sometimes rocky due to his addiction How were you disciplined when you got in trouble as a child/adolescent?:  yelled at Does patient have siblings?: Yes Number of Siblings: 2 Description of patient's current relationship with siblings: Pt reports good relationship with his siblings Did patient suffer any verbal/emotional/physical/sexual abuse as a child?: No Did patient suffer from severe childhood neglect?: No Has patient ever been sexually abused/assaulted/raped as an adolescent or adult?: No Was the patient ever a victim of a crime or a disaster?: No Witnessed domestic violence?: No Has patient been effected by domestic violence as an adult?: No  Education:  Highest grade of school patient has completed: Scientist, product/process development degree Currently a student?: No Learning disability?: No  Employment/Work Situation:   Employment situation: Employed 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?: No Are There Guns or Other Weapons in Your Home?: No Are These Comptroller?: (N/A)  Financial Resources:   Financial resources: Income from employment Does patient have a representative payee or guardian?: No  Alcohol/Substance Abuse:   What has been your use of drugs/alcohol within the last 12 months?: Pt reports using benzos daily 8mg , alcohol daily about a pint a day, and opiates 3/4 times a week Alcohol/Substance Abuse Treatment Hx: Past Tx, Inpatient If yes, describe treatment: [facility], AA/NA in the past Has alcohol/substance abuse ever caused legal problems?: No(Pt has court next month for a traffic ticket)  Social Support System:   Patient's Community Support System: Good Describe  Community Support System: "brother and my dad"  Type of faith/religion: Darrick Meigs How does patient's faith help to cope with current illness?: 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  Leisure/Recreation:   Leisure and Hobbies: "go to the gym, play basketball, be physical"  Strengths/Needs:   What is the patient's perception of their strengths?: "listen well" Patient states they can use these personal strengths during their treatment to contribute to their recovery: "listen and use the tools they give me to stay sober" Patient states these barriers may affect/interfere with their treatment: "money" Patient states these barriers may affect their return to the community: pt denies  Discharge Plan:   Currently receiving community mental health services: No Patient states concerns and preferences for aftercare planning are: Pt reports wanting referral for outpatient treatment. Patient states they will know when they are safe and ready for discharge when: "When I start feeling better physically and mentally" Does patient have access to transportation?: Yes Does patient have financial barriers related to discharge medications?: No Will patient be returning to same living situation after discharge?: Yes  Summary/Recommendations:   Summary and Recommendations (to be completed by the evaluator): Pt is a 34 yo male living in [place], [place] ([place]) with his father. Pt presents to the hospital seeking treatment for SA, depression, and SI. 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 Pt denies SI/HI/AVH. Pt is agreeable for outpatient mental health referral reporting he has work and does not want inpatient currently. Recommendations for pt include: crisis stabilization, therapeutic milieu, encourage group attendance and participation, medication management for mood stabilization, and development for comprehensive mental  wellness plan. CSW assessing for appropriate referrals.  [place] MSW LCSW 10/03/2019 2:10 PM

## 2019-10-03 NOTE — Tx Team (Signed)
Initial Treatment Plan 10/03/2019 12:26 AM [name] MBP:[id number]    PATIENT STRESSORS: Financial difficulties Occupational concerns Other: all due to COVID   PATIENT STRENGTHS: Average or above average intelligence Capable of independent living Communication skills Motivation for treatment/growth Supportive family/friends   PATIENT IDENTIFIED PROBLEMS: Substance abuse  Suicidal ideation  Depression  (wants to get clean of SA; reduce anxiety)               DISCHARGE CRITERIA:  Adequate post-discharge living arrangements Improved stabilization in mood, thinking, and/or behavior Verbal commitment to aftercare and medication compliance  PRELIMINARY DISCHARGE PLAN: Attend PHP/IOP Outpatient therapy Return to previous living arrangement Return to previous work or school arrangements  PATIENT/FAMILY INVOLVEMENT: This treatment plan has been presented to and reviewed with the patient, Eric Keller, and/or family member.  The patient and family have been given the opportunity to ask questions and make suggestions.  [name], RN 10/03/2019, 12:26 AM

## 2019-10-03 NOTE — BHH Group Notes (Signed)
10/03/2019 8:45am Type of Group and Topic: Psychoeducational Group: Discharge Planning Participation Level: Did Not Attend  Description of Group Discharge planning group reviews patient's anticipated discharge plans and assists patients to anticipate and address any barriers to wellness/recovery in the community. Suicide prevention education is reviewed with patients in group. Therapeutic Goals 1. Patients will state their anticipated discharge plan and mental health aftercare 2. Patients will identify potential barriers to wellness in the community setting 3. Patients will engage in problem solving, solution focused discussion of ways to anticipate and address barriers to wellness/recovery   Summary of Patient Progress Plan for Discharge/Comments:  Invited, chose not to attend.     Baldo Daub, MSW, LCSWA Clinical Social Worker [facility]  Phone: [phone] 10/03/2019 3:35 PM

## 2019-10-03 NOTE — BHH Suicide Risk Assessment (Signed)
[facility] Admission Suicide Risk Assessment   Nursing information obtained from:  Patient Demographic factors:  Male, Caucasian, Low socioeconomic status Current Mental Status:  NA Loss Factors:  Financial problems / change in socioeconomic status Historical Factors:  NA Risk Reduction Factors:  Positive social support, Living with another person, especially a relative  Total Time spent with patient: 45 minutes Principal Problem: MDD (major depressive disorder), recurrent severe, without psychosis ([place]) Diagnosis:  Principal Problem:   MDD (major depressive disorder), recurrent severe, without psychosis ([place]) Active Problems:   Polysubstance dependence ([place])  Subjective Data:   Continued Clinical Symptoms:  Alcohol Use Disorder Identification Test Final Score (AUDIT): 12 The "Alcohol Use Disorders Identification Test", Guidelines for Use in Primary Care, Second Edition.  World Pharmacologist [facility]). Score between 0-7:  no or low risk or alcohol related problems. Score between 8-15:  moderate risk of alcohol related problems. Score between 16-19:  high risk of alcohol related problems. Score 20 or above:  warrants further diagnostic evaluation for alcohol dependence and treatment.   CLINICAL FACTORS:  34 year old male. Single, no children, lives with father, employed Presented to hospital voluntarily requesting detox and describing worsening depression and anxiety.  Describes  neurovegetative symptoms: some anhedonia, decreased appetite,low energy level, erratic sleep. He states he was experiencing SI, which he currently endorses as passive , such as feeling " maybe my family would be better off If I were dead".  He  denies psychotic symptoms Reports history of polysubstance dependence-states he was using Xanax and alcohol daily, and opiate analgesics regularly but not daily. He reports he was using up to 8 mgr of Xanax daily, drinking on most days, around a pint of liquor per day. He  reports he was using Suboxone several times per week, and occasionally also opiate analgesics such as Roxycodone, although not regularly. He reports he last used Xanax 2-3 days ago, and alcohol on day of admission. Admission UDS (+) for BZDs and Cannabis, BAL negative .  He reports that he has been taking his prescribed medications irregularly, and that he had been off all his meds for about two weeks recently.   Reports history of substance ( BZDs, Opiates ) and alcohol use disorders. He reports he has had episodes of sobriety lasting 3-4 years. 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 Denies history of alcohol related blackouts . He was admitted to medical unit for Alcohol WDL back in January 2021. At the time was discharged on Buspar, Clonidine , Prozac, Neurontin, Metoprolol.  Medical History- HTN. NKDA.  Reports history of a prior seizure , which he feels was related to [facility].  Home medications- Lopressor 50 mgrs BID, Neurontin 300 mgrs TID, Prozac 10 mgrs QDAY, Buspar 10 mgrs TID,  Clonidine 0.1 mgr TID ( for HTN) which he reports he was taking regularly   * Currently patient presents flushed and slightly tremulous. Does not appear in acute distress .  Dx- Polysubstance Use Disorder ( Alcohol, Opiates , BZD ) Substance Induced Mood Disorder versus MDD   Plan- Inpatient admission We reviewed history/options- patient reports last xanax use was 3 days ago and last alcohol consumption was prior to admission. (UDS (+) for BZDs/BAL negative) . He reports history of a WDL seizure in the past . Patient reports he is prescribed Clonidine 1 mgr TID, which he states he was taking regularly, for HTN, without side effects. Clonidine should also be beneficial for opiate WDL symptoms.   Start Ativan detox protocol, thiamine/folate supplementation to treat  alcohol WDL.  Continue Prozac 10 mgrs QDAY for depression and anxiety Continue Neurontin 300 mgrs TID for anxiety, pain, alcohol use disorder        Musculoskeletal: Strength & Muscle Tone: within normal limits some flushing and mild distal tremors noted  Gait & Station: normal Patient leans: N/A  Psychiatric Specialty Exam: Physical Exam  Review of Systems reports feeling " sweaty". No chest pain,no shortness of breath at room air .   Blood pressure (!) 124/92, pulse 84, temperature 97.8 F (36.6 C), temperature source Oral, resp. rate 16, height 6\' 3"  (1.905 m), weight 96.2 kg, SpO2 92 %.Body mass index is 26.5 kg/m.  General Appearance: Fairly Groomed  Eye Contact:  Good  Speech:  Normal Rate  Volume:  Normal  Mood:  reports feeling depressed and characterizes mood as 2/10  Affect:  constricted  Thought Process:  Linear and Descriptions of Associations: Intact  Orientation:  Other:  fully alert and attentive  Thought Content:  no hallucinations, no delusions, not internally preoccupied   Suicidal Thoughts:  No  Homicidal Thoughts:  No  Memory:  recent and remote grossly intact   Judgement:  Fair  Insight:  Fair  Psychomotor Activity:  Normal there is no psychomotor agitation or restlessness. (+) distal tremors  Concentration:  Concentration: Good and Attention Span: Good  Recall:  Good  Fund of Knowledge:  Good  Language:  Good  Akathisia:  Negative  Handed:  Right  AIMS (if indicated):     Assets:  Communication Skills Desire for Improvement Resilience  ADL's:  Intact  Cognition:  WNL  Sleep:  Reports decreased/fair sleep      COGNITIVE FEATURES THAT CONTRIBUTE TO RISK:  Closed-mindedness, Loss of executive function and Polarized thinking    SUICIDE RISK:   Moderate:  Frequent suicidal ideation with limited intensity, and duration, some specificity in terms of plans, no associated intent, good self-control, limited dysphoria/symptomatology, some risk factors present, and identifiable protective factors, including available and accessible social support.  PLAN OF CARE: Patient will be admitted to  inpatient psychiatric unit for stabilization and safety. Will provide and encourage milieu participation. Provide medication management and maked adjustments as needed. Will provide medication management to address potential WDL.  Will follow daily.    I certify that inpatient services furnished can reasonably be expected to improve the patient's condition.   , MD 10/03/2019, 1:05 PM

## 2019-10-03 NOTE — Tx Team (Signed)
Interdisciplinary Treatment and Diagnostic Plan Update  10/03/2019 Time of Session: 9:30am Eric Keller MRN: [id number]  Principal Diagnosis: MDD (major depressive disorder), recurrent severe, without psychosis ([place])  Secondary Diagnoses: Principal Problem:   MDD (major depressive disorder), recurrent severe, without psychosis ([place]) Active Problems:   Polysubstance dependence ([place])   Current Medications:  Current Facility-Administered Medications  Medication Dose Route Frequency Provider Last Rate Last Admin  . cholecalciferol (VITAMIN D3) tablet 400 Units  400 Units Oral Daily Rankin, Shuvon B, NP   400 Units at 10/03/19 0845  . cloNIDine (CATAPRES) tablet 0.1 mg  0.1 mg Oral TID Rankin, Shuvon B, NP   0.1 mg at 10/03/19 1210  . FLUoxetine (PROZAC) capsule 10 mg  10 mg Oral Daily Rankin, Shuvon B, NP   10 mg at 10/03/19 0842  . gabapentin (NEURONTIN) capsule 300 mg  300 mg Oral TID Cobos, Myer Peer, MD   300 mg at 10/03/19 1210  . hydrOXYzine (ATARAX/VISTARIL) tablet 25 mg  25 mg Oral Q6H PRN Cobos, Myer Peer, MD      . loperamide (IMODIUM) capsule 2-4 mg  2-4 mg Oral PRN Cobos, Myer Peer, MD      . LORazepam (ATIVAN) tablet 1 mg  1 mg Oral Q6H PRN Cobos, Myer Peer, MD      . LORazepam (ATIVAN) tablet 1 mg  1 mg Oral QID Cobos, Myer Peer, MD   1 mg at 10/03/19 1353   Followed by  . [START ON 10/04/2019] LORazepam (ATIVAN) tablet 1 mg  1 mg Oral TID Cobos, Myer Peer, MD       Followed by  . [START ON 10/05/2019] LORazepam (ATIVAN) tablet 1 mg  1 mg Oral BID Cobos, Myer Peer, MD       Followed by  . [START ON 10/06/2019] LORazepam (ATIVAN) tablet 1 mg  1 mg Oral Daily Cobos, Fernando A, MD      . magnesium hydroxide (MILK OF MAGNESIA) suspension 30 mL  30 mL Oral Daily PRN Rankin, Shuvon B, NP      . metoprolol tartrate (LOPRESSOR) tablet 50 mg  50 mg Oral BID Rankin, Shuvon B, NP   50 mg at 10/03/19 0843  . multivitamin with minerals tablet 1 tablet  1 tablet Oral Daily Cobos,  Myer Peer, MD   1 tablet at 10/03/19 1352  . thiamine (B-1) injection 100 mg  100 mg Intramuscular Once Cobos, Fernando A, MD      . thiamine tablet 100 mg  100 mg Oral Daily Cobos, Myer Peer, MD   100 mg at 10/03/19 1351  . traZODone (DESYREL) tablet 50 mg  50 mg Oral QHS PRN Cobos, Myer Peer, MD       PTA Medications: Medications Prior to Admission  Medication Sig Dispense Refill Last Dose  . busPIRone (BUSPAR) 10 MG tablet Take 1 tablet (10 mg total) by mouth 3 (three) times daily. 90 tablet 0   . Cholecalciferol (VITAMIN D3) 10 MCG (400 UNIT) CAPS Take 1 tablet by mouth daily. 150 capsule 0   . cloNIDine (CATAPRES) 0.1 MG tablet Take 1 tablet (0.1 mg total) by mouth 3 (three) times daily. Do not stop suddenly 90 tablet 0   . doxycycline (VIBRA-TABS) 100 MG tablet Take 1 tablet (100 mg total) by mouth 2 (two) times daily. (Patient not taking: Reported on 10/01/2019) 20 tablet 0   . FLUoxetine (PROZAC) 10 MG capsule Take 1 capsule (10 mg total) by mouth daily. 30 capsule 3   .  gabapentin (NEURONTIN) 300 MG capsule Take 1 capsule (300 mg total) by mouth 3 (three) times daily. 90 capsule 0   . hydrOXYzine (ATARAX/VISTARIL) 50 MG tablet Take 1 tablet (50 mg total) by mouth every 8 (eight) hours as needed for anxiety. 30 tablet 0   . metoprolol tartrate (LOPRESSOR) 50 MG tablet Take 1 tablet (50 mg total) by mouth 2 (two) times daily. 180 tablet 0   . thiamine 100 MG tablet Take 1 tablet (100 mg total) by mouth daily. 30 tablet 0   . traZODone (DESYREL) 100 MG tablet Take 1 tablet (100 mg total) by mouth at bedtime as needed for sleep. 30 tablet 0     Patient Stressors: Financial difficulties Occupational concerns Other: all due to COVID  Patient Strengths: Average or above average intelligence Capable of independent living Communication skills Motivation for treatment/growth Supportive family/friends  Treatment Modalities: Medication Management, Group therapy, Case management,  1 to 1  session with clinician, Psychoeducation, Recreational therapy.   Physician Treatment Plan for Primary Diagnosis: MDD (major depressive disorder), recurrent severe, without psychosis ([place]) Long Term Goal(s): Improvement in symptoms so as ready for discharge Improvement in symptoms so as ready for discharge   Short Term Goals: Ability to identify changes in lifestyle to reduce recurrence of condition will improve Ability to verbalize feelings will improve Ability to disclose and discuss suicidal ideas Ability to demonstrate self-control will improve Ability to identify and develop effective coping behaviors will improve Ability to maintain clinical measurements within normal limits will improve Compliance with prescribed medications will improve Ability to identify triggers associated with substance abuse/mental health issues will improve Ability to identify changes in lifestyle to reduce recurrence of condition will improve Ability to verbalize feelings will improve Ability to disclose and discuss suicidal ideas Ability to demonstrate self-control will improve Ability to identify and develop effective coping behaviors will improve Ability to maintain clinical measurements within normal limits will improve Compliance with prescribed medications will improve Ability to identify triggers associated with substance abuse/mental health issues will improve  Medication Management: Evaluate patient's response, side effects, and tolerance of medication regimen.  Therapeutic Interventions: 1 to 1 sessions, Unit Group sessions and Medication administration.  Evaluation of Outcomes: Not Met  Physician Treatment Plan for Secondary Diagnosis: Principal Problem:   MDD (major depressive disorder), recurrent severe, without psychosis ([place]) Active Problems:   Polysubstance dependence ([place])  Long Term Goal(s): Improvement in symptoms so as ready for discharge Improvement in symptoms so as ready for  discharge   Short Term Goals: Ability to identify changes in lifestyle to reduce recurrence of condition will improve Ability to verbalize feelings will improve Ability to disclose and discuss suicidal ideas Ability to demonstrate self-control will improve Ability to identify and develop effective coping behaviors will improve Ability to maintain clinical measurements within normal limits will improve Compliance with prescribed medications will improve Ability to identify triggers associated with substance abuse/mental health issues will improve Ability to identify changes in lifestyle to reduce recurrence of condition will improve Ability to verbalize feelings will improve Ability to disclose and discuss suicidal ideas Ability to demonstrate self-control will improve Ability to identify and develop effective coping behaviors will improve Ability to maintain clinical measurements within normal limits will improve Compliance with prescribed medications will improve Ability to identify triggers associated with substance abuse/mental health issues will improve     Medication Management: Evaluate patient's response, side effects, and tolerance of medication regimen.  Therapeutic Interventions: 1 to 1 sessions, Unit Group  sessions and Medication administration.  Evaluation of Outcomes: Not Met   RN Treatment Plan for Primary Diagnosis: MDD (major depressive disorder), recurrent severe, without psychosis ([place]) Long Term Goal(s): Knowledge of disease and therapeutic regimen to maintain health will improve  Short Term Goals: Ability to participate in decision making will improve, Ability to disclose and discuss suicidal ideas, Ability to identify and develop effective coping behaviors will improve and Compliance with prescribed medications will improve  Medication Management: RN will administer medications as ordered by provider, will assess and evaluate patient's response and provide  education to patient for prescribed medication. RN will report any adverse and/or side effects to prescribing provider.  Therapeutic Interventions: 1 on 1 counseling sessions, Psychoeducation, Medication administration, Evaluate responses to treatment, Monitor vital signs and CBGs as ordered, Perform/monitor CIWA, COWS, AIMS and Fall Risk screenings as ordered, Perform wound care treatments as ordered.  Evaluation of Outcomes: Not Met   LCSW Treatment Plan for Primary Diagnosis: MDD (major depressive disorder), recurrent severe, without psychosis ([place]) Long Term Goal(s): Safe transition to appropriate next level of care at discharge, Engage patient in therapeutic group addressing interpersonal concerns.  Short Term Goals: Engage patient in aftercare planning with referrals and resources  Therapeutic Interventions: Assess for all discharge needs, 1 to 1 time with Social worker, Explore available resources and support systems, Assess for adequacy in community support network, Educate family and significant other(s) on suicide prevention, Complete Psychosocial Assessment, Interpersonal group therapy.  Evaluation of Outcomes: Not Met   Progress in Treatment: Attending groups: No. New to unit  Participating in groups: No. Taking medication as prescribed: Yes. Toleration medication: Yes. Family/Significant other contact made: No, will contact:  the patient's father or brother  Patient understands diagnosis: Yes. Discussing patient identified problems/goals with staff: Yes. Medical problems stabilized or resolved: Yes. Denies suicidal/homicidal ideation: Yes. Issues/concerns per patient self-inventory: No. Other:   New problem(s) identified: None   New Short Term/Long Term Goal(s): Detox,  medication stabilization, elimination of SI thoughts, development of comprehensive mental wellness plan.    Patient Goals:  "To get clean for one  Discharge Plan or Barriers: Patient expressed  interest in residential treatment services for substance abuse at discharge. Patient recently admitted. CSW will continue to follow and assess for appropriate referrals and possible discharge planning.    Reason for Continuation of Hospitalization: Anxiety Depression Medication stabilization Suicidal ideation  Estimated Length of Stay: 3-5 days   Attendees: Patient: Eric Keller 10/03/2019 2:31 PM  Physician: Dr. Neita Garnet, MD 10/03/2019 2:31 PM  Nursing:  10/03/2019 2:31 PM  RN Care Manager: 10/03/2019 2:31 PM  Social Worker: Radonna Ricker, LCSW 10/03/2019 2:31 PM  Recreational Therapist:  10/03/2019 2:31 PM  Other:  10/03/2019 2:31 PM  Other:  10/03/2019 2:31 PM  Other: 10/03/2019 2:31 PM    Scribe for Treatment Team: Marylee Floras, [place] 10/03/2019 2:31 PM

## 2019-10-03 NOTE — Progress Notes (Signed)
D:  Patient denied SI and HI, contracts for safety.  Denied visual hallucinations.  Patient stated he does hear music, radio voices.   A:  Medications administered per MD orders.  Emotional support and encouragement given patient.  R:  Safety maintained with 15 minute checks.

## 2019-10-03 NOTE — Plan of Care (Signed)
Nurse discussed anxiety, depression and coping skills with patient.  

## 2019-10-04 DIAGNOSIS — F332 Major depressive disorder, recurrent severe without psychotic features: Secondary | ICD-10-CM | POA: Diagnosis not present

## 2019-10-04 MED ORDER — ENSURE ENLIVE PO LIQD
237.0000 mL | Freq: Two times a day (BID) | ORAL | Status: DC
  Administered 2019-10-05: 237 mL via ORAL

## 2019-10-04 NOTE — Progress Notes (Signed)
   10/04/19 2129  Psych Admission Type (Psych Patients Only)  Admission Status Voluntary  Psychosocial Assessment  Patient Complaints Anxiety  Eye Contact Fair  Facial Expression Sad;Anxious  Affect Depressed  Speech Logical/coherent  Interaction Assertive  Motor Activity Slow  Appearance/Hygiene Unremarkable  Behavior Characteristics Cooperative;Anxious  Mood Depressed;Anxious  Thought Process  Coherency WDL  Content WDL  Delusions None reported or observed  Perception WDL  Hallucination None reported or observed  Judgment Poor  Confusion None  Danger to Self  Current suicidal ideation? Denies  Danger to Others  Danger to Others None reported or observed   Pt seen at med window. Endorses the following withdrawal symptoms: shakes, sweats, rhinorrhea, and dizziness. Pt has eaten more today and his nausea has subsided. Rates anxiety 7/10. States that the gabapentin is not working for sustained pain control. "I can feel it working for a couple hours after I take it but then its gone and the pain is back."

## 2019-10-04 NOTE — Progress Notes (Signed)
[facility] MD Progress Note  10/04/2019 11:51 AM Eric Keller  MRN:  [id number] Subjective: Patient describes partial improvement compared to admission.  He does acknowledge feeling "a little better" today.  He denies vomiting and is tolerating p.o. intake well although describes appetite is fair.  He denies suicidal ideations. Objective: I discussed case with treatment team and met with patient. 34 year old male, presented to hospital voluntarily reporting worsening depression, neurovegetative symptoms, passive SI, and seeking help for substance abuse and detox.  He reports a history of polysubstance use disorder, stated he was using Xanax and alcohol on a regular basis sometimes up to 8 mg of Xanax daily and up to a pint of liquor per day.  He was also using Suboxone several times per week.  Today patient presents alert, attentive, oriented x3.  No psychomotor agitation or overt restlessness at this time. Today he describes some improvement compared to admission although endorses ongoing symptoms of withdrawal, primarily reports he feels subjectively " jittery" and "sweaty".  He has slight distal tremors.  No psychomotor agitation or restlessness is noted and he presents psychomotorically calm.  BP 134/99 with a pulse of 87. Denies suicidal ideations and presents future oriented, focusing more on disposition planning options.  States he would be interested in a CD IOP level of care following discharge, if available. Behavior on unit good control, some group participation.  Principal Problem: Benzodiazepine, alcohol, opiate use disorder.  BZD/alcohol withdrawal.  Substance-induced depression versus MDD. Diagnosis: Principal Problem:   MDD (major depressive disorder), recurrent severe, without psychosis ([place]) Active Problems:   Polysubstance dependence ([place])  Total Time spent with patient: 15 minutes  Past Psychiatric History:   Past Medical History:  Past Medical History:  Diagnosis Date  .  Alcohol withdrawal ([place]) 07/20/2019  . Anxiety   . Anxiety   . Depression   . H/O: substance abuse ([place])   . Hypertension     Past Surgical History:  Procedure Laterality Date  . WISDOM TOOTH EXTRACTION     Family History:  Family History  Problem Relation Age of Onset  . Cancer Mother   . Hypertension Paternal Grandfather   . Heart disease Paternal Grandfather        MIs  . Heart disease Other   . Stroke Other    Family Psychiatric  History:  Social History:  Social History   Substance and Sexual Activity  Alcohol Use Yes   Comment: daily 5th of liquor     Social History   Substance and Sexual Activity  Drug Use Not Currently  . Types: Marijuana   Comment: Vicodin Abuse, BEnzos    Social History   Socioeconomic History  . Marital status: Single    Spouse name: Not on file  . Number of children: Not on file  . Years of education: Not on file  . Highest education level: Not on file  Occupational History  . Not on file  Tobacco Use  . Smoking status: Never Smoker  . Smokeless tobacco: Never Used  Substance and Sexual Activity  . Alcohol use: Yes    Comment: daily 5th of liquor  . Drug use: Not Currently    Types: Marijuana    Comment: Vicodin Abuse, BEnzos  . Sexual activity: Not on file  Other Topics Concern  . Not on file  Social History Narrative  . Not on file   Social Determinants of Health   Financial Resource Strain:   . Difficulty of Paying Living Expenses:  Food Insecurity:   . Worried About Charity fundraiser in the Last Year:   . Arboriculturist in the Last Year:   Transportation Needs:   . Film/video editor (Medical):   Marland Kitchen Lack of Transportation (Non-Medical):   Physical Activity:   . Days of Exercise per Week:   . Minutes of Exercise per Session:   Stress:   . Feeling of Stress :   Social Connections:   . Frequency of Communication with Friends and Family:   . Frequency of Social Gatherings with Friends and Family:   .  Attends Religious Services:   . Active Member of Clubs or Organizations:   . Attends Archivist Meetings:   Marland Kitchen Marital Status:    Additional Social History:   Sleep: Fair  Appetite:  Fair  Current Medications: Current Facility-Administered Medications  Medication Dose Route Frequency Provider Last Rate Last Admin  . cholecalciferol (VITAMIN D3) tablet 400 Units  400 Units Oral Daily Rankin, Shuvon B, NP   400 Units at 10/03/19 0845  . cloNIDine (CATAPRES) tablet 0.1 mg  0.1 mg Oral TID Rankin, Shuvon B, NP   0.1 mg at 10/04/19 0819  . FLUoxetine (PROZAC) capsule 10 mg  10 mg Oral Daily Rankin, Shuvon B, NP   10 mg at 10/04/19 0818  . gabapentin (NEURONTIN) capsule 300 mg  300 mg Oral TID Avriel Kandel, Myer Peer, MD   300 mg at 10/04/19 0818  . hydrOXYzine (ATARAX/VISTARIL) tablet 25 mg  25 mg Oral Q6H PRN Lynda Wanninger, Myer Peer, MD   25 mg at 10/03/19 2110  . loperamide (IMODIUM) capsule 2-4 mg  2-4 mg Oral PRN Zeev Deakins, Myer Peer, MD      . LORazepam (ATIVAN) tablet 1 mg  1 mg Oral Q6H PRN Genifer Lazenby, Myer Peer, MD      . LORazepam (ATIVAN) tablet 1 mg  1 mg Oral TID Yukiko Minnich, Myer Peer, MD   1 mg at 10/04/19 0818   Followed by  . [START ON 10/05/2019] LORazepam (ATIVAN) tablet 1 mg  1 mg Oral BID Shandria Clinch, Myer Peer, MD       Followed by  . [START ON 10/06/2019] LORazepam (ATIVAN) tablet 1 mg  1 mg Oral Daily Bradee Common A, MD      . magnesium hydroxide (MILK OF MAGNESIA) suspension 30 mL  30 mL Oral Daily PRN Rankin, Shuvon B, NP      . metoprolol tartrate (LOPRESSOR) tablet 50 mg  50 mg Oral BID Rankin, Shuvon B, NP   50 mg at 10/04/19 0819  . multivitamin with minerals tablet 1 tablet  1 tablet Oral Daily Enrico Eaddy, Myer Peer, MD   1 tablet at 10/04/19 0819  . thiamine (B-1) injection 100 mg  100 mg Intramuscular Once Konor Noren A, MD      . thiamine tablet 100 mg  100 mg Oral Daily Elizette Shek, Myer Peer, MD   100 mg at 10/04/19 0817  . traZODone (DESYREL) tablet 50 mg  50 mg Oral QHS PRN  Diem Pagnotta, Myer Peer, MD   50 mg at 10/03/19 2110    Lab Results: No results found for this or any previous visit (from the past 48 hour(s)).  Blood Alcohol level:  Lab Results  Component Value Date   ETH <10 10/01/2019   ETH <10 28/36/6294    Metabolic Disorder Labs: No results found for: HGBA1C, MPG No results found for: PROLACTIN No results found for: CHOL, TRIG, HDL, CHOLHDL, VLDL, LDLCALC  Physical Findings: AIMS:  , ,  ,  ,    CIWA:  CIWA-Ar Total: 5 COWS:     Musculoskeletal: Strength & Muscle Tone: within normal limits-slight distal tremors noted, no overt psychomotor agitation or restlessness, today does not appear as flushed and no diaphoresis is noted. Gait & Station: normal Patient leans: N/A  Psychiatric Specialty Exam: Physical Exam  Review of Systems denies headache, denies visual symptoms, no chest pain, no shortness of breath at room air, no vomiting  Blood pressure (!) 134/99, pulse 87, temperature 97.7 F (36.5 C), temperature source Oral, resp. rate 16, height 6' 3" (1.905 m), weight 96.2 kg, SpO2 100 %.Body mass index is 26.5 kg/m.  General Appearance: Improving grooming  Eye Contact:  Good  Speech:  Normal Rate  Volume:  Normal  Mood:  Reports feeling partially better than he did prior to admission, remains vaguely depressed and anxious  Affect:  Congruent  Thought Process:  Linear and Descriptions of Associations: Intact  Orientation:  Other:  Fully alert and attentive  Thought Content:  No hallucinations, no delusions, not internally preoccupied  Suicidal Thoughts:  No denies any suicidal or self-injurious ideations at this time, contracts for safety on unit  Homicidal Thoughts:  No  Memory:  Recent and remote grossly intact  Judgement:  Other:  Fair/improving  Insight:  Fair  Psychomotor Activity:  Normal-slight/mild distal tremors noted  Concentration:  Concentration: Improving and Attention Span: Improving  Recall:  Good  Fund of Knowledge:   Good  Language:  Good  Akathisia:  Negative  Handed:  Right  AIMS (if indicated):     Assets:  Communication Skills Desire for Improvement Resilience  ADL's:  Intact  Cognition:  WNL  Sleep:  Number of Hours: 5.5   Assessment:  34 year old male, presented to hospital voluntarily reporting worsening depression, neurovegetative symptoms, passive SI, and seeking help for substance abuse and detox.  He reports a history of polysubstance use disorder, stated he was using Xanax and alcohol on a regular basis sometimes up to 8 mg of Xanax daily and up to a pint of liquor per day.  He was also using Suboxone several times per week.  Today patient presents with some improvement.  He does continue to describe some withdrawal symptoms and states he feels subjectively jittery and sweaty.  He appears comfortable and in no acute distress, mild tremors are noted.  He is not tachycardic.  He is tolerating medications well thus far and denies side effects.  Today denies any SI and presents future oriented.  Treatment Plan Summary: Daily contact with patient to assess and evaluate symptoms and progress in treatment, Medication management, Plan Inpatient treatment and Medications as below Encourage group and milieu participation Encourage efforts to work on sobriety and relapse prevention Treatment team working on disposition planning options Continue Ativan taper as per protocol for alcohol withdrawal Continue thiamine and folate supplementation for alcohol dependence/withdrawal Continue Neurontin 300 mg 3 times daily for anxiety, pain, alcohol dependence Continue Prozac 10 mg daily for depression and anxiety Continue clonidine 0.1 mg 3 times daily for hypertension and symptoms of opiate withdrawal  Continue Imodium as needed for diarrhea if needed Continue Lopressor 50 mg twice daily for hypertension  Jenne Campus, MD 10/04/2019, 11:51 AM

## 2019-10-04 NOTE — Progress Notes (Signed)
NUTRITION ASSESSMENT  Pt identified as at risk on the Malnutrition Screen Tool  INTERVENTION: Ensure Enlive po BID, each supplement provides 350 kcal and 20 grams of protein  Continue MVI with minerals and thiamine  daily   Recommend 1mg  folic acid po daily for alcohol withdrawal  Patient is at high risk for refeeding, if able monitor magnesium, potassium, and phosphorus daily for at least 3 days, MD to replete as needed, given report of regular po intake of ~ 1pint of liquor/day    NUTRITION DIAGNOSIS: Predicted sub optimal nutrient intake related to polysubstance abuse as evidenced by pt report   Goal: Pt to meet >/= 90% of their estimated nutrition needs.  Monitor:  PO intake  Assessment:  RD working remotely.  34 y.o. male with past medical history of substance abuse, recent hospitalization in 06/2019 for alcohol/benzo withdrawal and alcohol starvation, HTN, anxiety and depression presented voluntarily to [facility] with reports of SI with thoughts of running his car off an embankment and admitted on 4/13 for polysubstance dependence and recurrent severe MDD.   Per chart, patient reports feeling depressed for several months. He endorses anhedonia, crying episodes, isolation, decreased appetite, irritability, and using suboxone, heroin, xananx, clonzepam and alcohol for the past 1.5 years. He recalls using ~ 8 mg of xananx or clonazepam daily, 2 mg suboxone most days, occasionally injects heroin, and drinks about a pint of liquor/day. Per notes, patient reports feeling "a little better" today, endorses ongoing symptoms of withdrawal, reports feeling 'jittery" and "sweaty", denies vomiting and is tolerating po intake well, describes appetite as fair. RD will provide Ensure BID to aid with meeting needs. Pt is also offered choice of unit snacks mid-morning and mid-afternoon.    Per medication review, patient is receiving daily MVI and B1, recommend daily folic acid as well for alcohol  withdrawal. Patient is at high risk for refeeding, if able monitor magnesium, potassium, and phosphorus daily for at least 3 days, MD to replete as needed.  Height: Ht Readings from Last 1 Encounters:  10/02/19 6\' 3"  (1.905 m)    Weight: Wt Readings from Last 1 Encounters:  10/02/19 96.2 kg    Weight Hx: Wt Readings from Last 10 Encounters:  10/02/19 96.2 kg  08/29/19 93.8 kg  07/26/19 89.4 kg  05/28/19 99.8 kg  06/23/18 96.2 kg  05/11/18 96.2 kg  05/06/13 91.2 kg  02/14/12 95.3 kg  04/14/11 94.8 kg    BMI:  Body mass index is 26.5 kg/m. Pt meets criteria for overweight based on current BMI.  Estimated Nutritional Needs: Kcal: 25-30 kcal/kg Protein: > 1 gram protein/kg Fluid: 1 ml/kcal  Diet Order:  Diet Order    None      Lab results and medications reviewed.   02/16/12, RD, LDN Clinical Nutrition After Hours/Weekend Pager # in Amion

## 2019-10-04 NOTE — Progress Notes (Signed)
Pt attended group via zoom, this evening.

## 2019-10-04 NOTE — [facility] Suicide Risk Assessment (Signed)
[facility] INPATIENT:  Family/Significant Other Suicide Prevention Education  Suicide Prevention Education:  Education Completed; Kanton Kamel (pt's father), has been identified by the patient as the family member whom the patient may be residing, and identified as the person(s) who will aid the patient in the event of a mental health crisis (suicidal ideations/suicide attempt).  With written consent from the patient, the family member/significant other has been provided the following suicide prevention education, prior to the and/or following the discharge of the patient. Father informs CSW that he believes that pt would benefit from inpatient substance use treatment and prefers treatment instead of pt returning to live with his father. Pt's father expresses that he believes pt would be at risk to return to drinking if returning home without support. Father is more agreeable for pt to return home following inpatient treatment. Pts father agrees to verbalize his desire to his son to follow through with inpatient treatment.   The suicide prevention education provided includes the following:  Suicide risk factors  Suicide prevention and interventions  National Suicide Hotline telephone number  [facility] assessment telephone number  [facility] Emergency Assistance 911  [facility] and/or Residential Mobile Crisis Unit telephone number  Request made of family/significant other to:  Remove weapons (e.g., guns, rifles, knives), all items previously/currently identified as safety concern.    Remove drugs/medications (over-the-counter, prescriptions, illicit drugs), all items previously/currently identified as a safety concern.   Erin Sons 10/04/2019, 3:14 PM

## 2019-10-05 DIAGNOSIS — F332 Major depressive disorder, recurrent severe without psychotic features: Secondary | ICD-10-CM | POA: Diagnosis not present

## 2019-10-05 MED ORDER — LORAZEPAM 1 MG PO TABS
1.0000 mg | ORAL_TABLET | Freq: Three times a day (TID) | ORAL | Status: AC
  Administered 2019-10-05 (×2): 1 mg via ORAL
  Filled 2019-10-05 (×2): qty 1

## 2019-10-05 MED ORDER — FOLIC ACID 1 MG PO TABS
1.0000 mg | ORAL_TABLET | Freq: Every day | ORAL | Status: DC
  Administered 2019-10-05 – 2019-10-09 (×5): 1 mg via ORAL
  Filled 2019-10-05: qty 1
  Filled 2019-10-05: qty 14
  Filled 2019-10-05 (×8): qty 1

## 2019-10-05 MED ORDER — LORAZEPAM 1 MG PO TABS
1.0000 mg | ORAL_TABLET | Freq: Two times a day (BID) | ORAL | Status: AC
  Administered 2019-10-06 (×2): 1 mg via ORAL
  Filled 2019-10-05 (×2): qty 1

## 2019-10-05 MED ORDER — LORAZEPAM 1 MG PO TABS
1.0000 mg | ORAL_TABLET | Freq: Every day | ORAL | Status: AC
  Administered 2019-10-07: 1 mg via ORAL
  Filled 2019-10-05: qty 1

## 2019-10-05 NOTE — Plan of Care (Signed)
Nurse discussed anxiety, depression and coping skills with patient.  

## 2019-10-05 NOTE — [facility] Group Notes (Signed)
LCSW Group Therapy Note 10/05/2019 1:53 PM  Type of Therapy/Topic: Group Therapy: Emotion Regulation  Participation Level: Active   Description of Group:  The purpose of this group is to assist patients in learning to regulate negative emotions and experience positive emotions. Patients will be guided to discuss ways in which they have been vulnerable to their negative emotions. These vulnerabilities will be juxtaposed with experiences of positive emotions or situations, and patients will be challenged to use positive emotions to combat negative ones. Special emphasis will be placed on coping with negative emotions in conflict situations, and patients will process healthy conflict resolution skills.  Therapeutic Goals: 1. Patient will identify two positive emotions or experiences to reflect on in order to balance out negative emotions 2. Patient will label two or more emotions that they find the most difficult to experience 3. Patient will demonstrate positive conflict resolution skills through discussion and/or role plays  Summary of Patient Progress:  Eric Keller was engaged and participated throughout the group session. Eric Keller reports he struggles with maintaining his emotions of sadness and anger. He reports he believes once he repairs his relationship with family, he will be able to regulate his emotions better. He also identified alcohol and drug use as barriers to his ability to regulate emotions appropriately.  Eric Keller was given a packet on emotional regulation that included self reflection questions regarding "building positive emotions", "identifying opposite emotions" and "paying attention to positives emotions".     Therapeutic Modalities:  Cognitive Behavioral Therapy Feelings Identification Dialectical Behavioral Therapy   Alcario Drought Clinical Social Worker

## 2019-10-05 NOTE — Progress Notes (Signed)
Pt seen at med window. Endorsing increased anxiety, increased sweating stating "my blanket is really wet right now." Pt assessed and given Ativan 1 mg.

## 2019-10-05 NOTE — Progress Notes (Signed)
D:  Patient's self inventory sheet, patient has poor sleep, sleep medication helpful.  Fair appetite, low energy level, poor concentration.  Rated depression 7, hopeless 6, anxiety 8.  Withdrawals, tremors, chilling, cravings, cramping, agitation, nausea, runny nose.  Denied SI.  Physical problems, pain, dizzy, headaches. Worst pain #6 in past 24 hours, joints, back.  Pain medicine helpful.  Goal is work on Pharmacologist.  Plans to do worksheets.  Does have discharge plans. A:  Medications administered per MD orders.  Emotional support and encouragement given patient. R:  Denied SI and HI, contracts for safety.  Denied A/V hallucinations. Safety maintained with 15 minute checks.

## 2019-10-05 NOTE — Progress Notes (Signed)
[facility] MD Progress Note  10/05/2019 7:37 AM Eric Keller  MRN:  [id number] Subjective: reports gradual improvement , states he feels more " clear headed". Does endorse some ongoing withdrawal symptoms, feeling slightly tremulous/ " shaky" and " sweaty". Describes improving mood overall , but describes ongoing anxiety. Denies suicidal ideations.  Objective: I discussed case with treatment team and met with patient. 34 year old male, presented to hospital voluntarily reporting worsening depression, neurovegetative symptoms, passive SI, and seeking help for substance abuse and detox.  He reports a history of polysubstance use disorder, stated he was using Xanax and alcohol on a regular basis sometimes up to 8 mg of Xanax daily and up to a pint of liquor per day.  He was also using Suboxone several times per week.  Presents alert, attentive, cooperative, in no acute distress or discomfort, no psychomotor agitation. Subtle/ slight distal tremors noted . Vitals are normal today - BP 125/88, pulse 75 .  Reports feeling " achy",  lingering nausea, no vomiting, no diarrhea. He is tolerating PO intake well .  Describes improving mood, states " my mood is not great but it is better than when I got here". Denies medication side effects Behavior on unit calm and in good control. Going to some groups .   Principal Problem: Benzodiazepine, alcohol, opiate use disorder.  BZD/alcohol withdrawal.  Substance-induced depression versus MDD. Diagnosis: Principal Problem:   MDD (major depressive disorder), recurrent severe, without psychosis ([place]) Active Problems:   Polysubstance dependence ([place])  Total Time spent with patient: 20 minutes  Past Psychiatric History:   Past Medical History:  Past Medical History:  Diagnosis Date  . Alcohol withdrawal ([place]) 07/20/2019  . Anxiety   . Anxiety   . Depression   . H/O: substance abuse ([place])   . Hypertension     Past Surgical History:  Procedure Laterality Date  .  WISDOM TOOTH EXTRACTION     Family History:  Family History  Problem Relation Age of Onset  . Cancer Mother   . Hypertension Paternal Grandfather   . Heart disease Paternal Grandfather        MIs  . Heart disease Other   . Stroke Other    Family Psychiatric  History:  Social History:  Social History   Substance and Sexual Activity  Alcohol Use Yes   Comment: daily 5th of liquor     Social History   Substance and Sexual Activity  Drug Use Not Currently  . Types: Marijuana   Comment: Vicodin Abuse, BEnzos    Social History   Socioeconomic History  . Marital status: Single    Spouse name: Not on file  . Number of children: Not on file  . Years of education: Not on file  . Highest education level: Not on file  Occupational History  . Not on file  Tobacco Use  . Smoking status: Never Smoker  . Smokeless tobacco: Never Used  Substance and Sexual Activity  . Alcohol use: Yes    Comment: daily 5th of liquor  . Drug use: Not Currently    Types: Marijuana    Comment: Vicodin Abuse, BEnzos  . Sexual activity: Not on file  Other Topics Concern  . Not on file  Social History Narrative  . Not on file   Social Determinants of Health   Financial Resource Strain:   . Difficulty of Paying Living Expenses:   Food Insecurity:   . Worried About Charity fundraiser in the Last Year:   .  Ran Out of Food in the Last Year:   Transportation Needs:   . Film/video editor (Medical):   Marland Kitchen Lack of Transportation (Non-Medical):   Physical Activity:   . Days of Exercise per Week:   . Minutes of Exercise per Session:   Stress:   . Feeling of Stress :   Social Connections:   . Frequency of Communication with Friends and Family:   . Frequency of Social Gatherings with Friends and Family:   . Attends Religious Services:   . Active Member of Clubs or Organizations:   . Attends Archivist Meetings:   Marland Kitchen Marital Status:    Additional Social History:   Sleep:  fair/improving  Appetite:  Fair/improving   Current Medications: Current Facility-Administered Medications  Medication Dose Route Frequency Provider Last Rate Last Admin  . cholecalciferol (VITAMIN D3) tablet 400 Units  400 Units Oral Daily Rankin, Shuvon B, NP   400 Units at 10/04/19 1457  . cloNIDine (CATAPRES) tablet 0.1 mg  0.1 mg Oral TID Rankin, Shuvon B, NP   0.1 mg at 10/04/19 1722  . feeding supplement (ENSURE ENLIVE) (ENSURE ENLIVE) liquid 237 mL  237 mL Oral BID BM Jauna Raczynski A, MD      . FLUoxetine (PROZAC) capsule 10 mg  10 mg Oral Daily Rankin, Shuvon B, NP   10 mg at 10/04/19 0818  . gabapentin (NEURONTIN) capsule 300 mg  300 mg Oral TID Sophie Quiles, Myer Peer, MD   300 mg at 10/04/19 1722  . hydrOXYzine (ATARAX/VISTARIL) tablet 25 mg  25 mg Oral Q6H PRN Aluel Schwarz, Myer Peer, MD   25 mg at 10/04/19 2114  . loperamide (IMODIUM) capsule 2-4 mg  2-4 mg Oral PRN Rikita Grabert, Myer Peer, MD      . LORazepam (ATIVAN) tablet 1 mg  1 mg Oral Q6H PRN Kayden Hutmacher, Myer Peer, MD   1 mg at 10/05/19 0023  . LORazepam (ATIVAN) tablet 1 mg  1 mg Oral BID Janaiyah Blackard, Myer Peer, MD       Followed by  . [START ON 10/06/2019] LORazepam (ATIVAN) tablet 1 mg  1 mg Oral Daily Philomina Leon A, MD      . magnesium hydroxide (MILK OF MAGNESIA) suspension 30 mL  30 mL Oral Daily PRN Rankin, Shuvon B, NP      . metoprolol tartrate (LOPRESSOR) tablet 50 mg  50 mg Oral BID Rankin, Shuvon B, NP   50 mg at 10/04/19 1722  . multivitamin with minerals tablet 1 tablet  1 tablet Oral Daily Charisse Wendell, Myer Peer, MD   1 tablet at 10/04/19 0819  . thiamine (B-1) injection 100 mg  100 mg Intramuscular Once Sherida Dobkins A, MD      . thiamine tablet 100 mg  100 mg Oral Daily Lenoir Facchini, Myer Peer, MD   100 mg at 10/04/19 0817  . traZODone (DESYREL) tablet 50 mg  50 mg Oral QHS PRN Trystin Hargrove, Myer Peer, MD   50 mg at 10/04/19 2114    Lab Results: No results found for this or any previous visit (from the past 48 hour(s)).  Blood Alcohol  level:  Lab Results  Component Value Date   ETH <10 10/01/2019   ETH <10 65/79/0383    Metabolic Disorder Labs: No results found for: HGBA1C, MPG No results found for: PROLACTIN No results found for: CHOL, TRIG, HDL, CHOLHDL, VLDL, LDLCALC  Physical Findings: AIMS:  , ,  ,  ,    CIWA:  CIWA-Ar Total: 11 COWS:  Musculoskeletal: Strength & Muscle Tone: within normal limits- reports some persistent nausea, diaphoresis, tremors  Gait & Station: normal Patient leans: N/A  Psychiatric Specialty Exam: Physical Exam  Review of Systems no visual disturbances, no chest pain, no shortness of breath, (+) nausea, no vomiting, no diarrhea   Blood pressure 125/88, pulse 75, temperature 98 F (36.7 C), resp. rate 16, height 6' 3"  (1.905 m), weight 96.2 kg, SpO2 100 %.Body mass index is 26.5 kg/m.  General Appearance: Improving grooming  Eye Contact:  Good  Speech:  Normal Rate  Volume:  Normal  Mood:  gradually improving mood ]  Affect:  vaguely constricted, anxious, improves during session  Thought Process:  Linear and Descriptions of Associations: Intact  Orientation:  Other:  Fully alert and attentive  Thought Content:  No hallucinations, no delusions, not internally preoccupied  Suicidal Thoughts:  No denies any suicidal or self-injurious ideations at this time, contracts for safety on unit  Homicidal Thoughts:  No  Memory:  Recent and remote grossly intact  Judgement:  Other:  Fair/improving  Insight:  Fair  Psychomotor Activity:  Normal-no psychomotor agitations,  slight distal tremors noted   Concentration:  Concentration: Improving and Attention Span: Improving  Recall:  Good  Fund of Knowledge:  Good  Language:  Good  Akathisia:  Negative  Handed:  Right  AIMS (if indicated):     Assets:  Communication Skills Desire for Improvement Resilience  ADL's:  Intact  Cognition:  WNL  Sleep:  Number of Hours: 6.25   Assessment:  34 year old male, presented to hospital  voluntarily reporting worsening depression, neurovegetative symptoms, passive SI, and seeking help for substance abuse and detox.  He reports a history of polysubstance use disorder, stated he was using Xanax and alcohol on a regular basis sometimes up to 8 mg of Xanax daily and up to a pint of liquor per day.  He was also using Suboxone several times per week.  Reports gradual/partial improvement . Reports improving mood, remains vaguely anxious, denies SI. Reports some ongoing diaphoresis, tremulousness, gradually improving . Also endorses nausea , but no vomiting, no diarrhea, and tolerating PO intake well. Denies medication side effects at this time  Treatment Plan Summary: Daily contact with patient to assess and evaluate symptoms and progress in treatment, Medication management, Plan Inpatient treatment and Medications as below  Treatment plan reviewed as below today 4/16  Encourage group and milieu participation Encourage efforts to work on sobriety and relapse prevention Treatment team working on disposition planning options Continue standing Ativan taper ( and PRN if needed for breakthrough WDL symptoms)  as per protocol for alcohol withdrawal Continue thiamine and folate supplementation for alcohol dependence/withdrawal Continue Neurontin 300 mg 3 times daily for anxiety, pain, alcohol dependence Continue Prozac 10 mg daily for depression and anxiety Continue Clonidine 0.1 mg 3 times daily for hypertension and symptoms of opiate withdrawal  Continue Imodium as needed for diarrhea if needed Continue Lopressor 50 mg twice daily for hypertension Check BMP in AM Jenne Campus, MD 10/05/2019, 7:37 AMPatient ID: Eric Keller, male   DOB: 1985/11/15, 34 y.o.   MRN: [id number]

## 2019-10-06 DIAGNOSIS — F332 Major depressive disorder, recurrent severe without psychotic features: Secondary | ICD-10-CM | POA: Diagnosis not present

## 2019-10-06 LAB — BASIC METABOLIC PANEL
Anion gap: 9 (ref 5–15)
BUN: 12 mg/dL (ref 6–20)
CO2: 27 mmol/L (ref 22–32)
Calcium: 9.5 mg/dL (ref 8.9–10.3)
Chloride: 102 mmol/L (ref 98–111)
Creatinine, Ser: 1.47 mg/dL — ABNORMAL HIGH (ref 0.61–1.24)
GFR calc Af Amer: >60 mL/min (ref >60)
GFR calc non Af Amer: >60 mL/min (ref >60)
Glucose, Bld: 124 mg/dL — ABNORMAL HIGH (ref 70–99)
Potassium: 4 mmol/L (ref 3.5–5.1)
Sodium: 138 mmol/L (ref 135–145)

## 2019-10-06 LAB — MAGNESIUM: Magnesium: 2.2 mg/dL (ref 1.7–2.4)

## 2019-10-06 MED ORDER — HYDROXYZINE HCL 25 MG PO TABS
25.0000 mg | ORAL_TABLET | Freq: Four times a day (QID) | ORAL | Status: DC | PRN
  Administered 2019-10-06 – 2019-10-09 (×6): 25 mg via ORAL
  Filled 2019-10-06: qty 1
  Filled 2019-10-06: qty 20
  Filled 2019-10-06 (×4): qty 1

## 2019-10-06 MED ORDER — FLUOXETINE HCL 20 MG PO CAPS
20.0000 mg | ORAL_CAPSULE | Freq: Every day | ORAL | Status: DC
  Administered 2019-10-07 – 2019-10-09 (×3): 20 mg via ORAL
  Filled 2019-10-06: qty 1
  Filled 2019-10-06: qty 14
  Filled 2019-10-06 (×3): qty 1

## 2019-10-06 MED ORDER — TRAZODONE HCL 100 MG PO TABS
100.0000 mg | ORAL_TABLET | Freq: Every evening | ORAL | Status: DC | PRN
  Administered 2019-10-07 – 2019-10-08 (×2): 100 mg via ORAL
  Filled 2019-10-06 (×2): qty 1
  Filled 2019-10-06: qty 14

## 2019-10-06 MED ORDER — HYDROXYZINE HCL 25 MG PO TABS
ORAL_TABLET | ORAL | Status: AC
  Filled 2019-10-06: qty 1

## 2019-10-06 NOTE — Progress Notes (Signed)
[facility] Group Notes:  (Nursing/MHT/Case Management/Adjunct)  Date:  10/06/2019  Time:  2030  Type of Therapy:  wrap up group  Participation Level:  Active  Participation Quality:  Appropriate, Attentive, Sharing and Supportive  Affect:  Blunted  Cognitive:  Appropriate  Insight:  Improving  Engagement in Group:  Engaged  Modes of Intervention:  Clarification, Education and Support  Summary of Progress/Problems: Positive thinking and positive change were discussed.   Marcille Buffy 10/06/2019, 8:55 PM

## 2019-10-06 NOTE — Progress Notes (Signed)
D. Pt presents as anxious, is friendly during interactions-reports improving mood today- pt observed in the dayroom attending group led by SW this am. Pt currently denies SI/HI and AVH A. Labs and vitals monitored. Pt compliant with medications. Pt supported emotionally and encouraged to express concerns and ask questions.   R. Pt remains safe with 15 minute checks. Will continue POC.

## 2019-10-06 NOTE — Progress Notes (Signed)
   10/06/19 2044  Psych Admission Type (Psych Patients Only)  Admission Status Voluntary  Psychosocial Assessment  Patient Complaints Anxiety;Substance abuse  Eye Contact Fair  Facial Expression Anxious  Affect Apprehensive  Speech Logical/coherent  Interaction Assertive  Motor Activity Other (Comment) (WDL)  Appearance/Hygiene Unremarkable  Behavior Characteristics Appropriate to situation  Mood Anxious;Pleasant  Thought Process  Coherency WDL  Content WDL  Delusions None reported or observed  Perception WDL  Hallucination None reported or observed  Judgment Impaired  Confusion None  Danger to Self  Current suicidal ideation? Denies  Danger to Others  Danger to Others None reported or observed

## 2019-10-06 NOTE — Progress Notes (Signed)
   10/06/19 2042  COVID-19 Daily Checkoff  Have you had a fever (temp > 37.80C/100F)  in the past 24 hours?  No  If you have had runny nose, nasal congestion, sneezing in the past 24 hours, has it worsened? No  COVID-19 EXPOSURE  Have you traveled outside the state in the past 14 days? No  Have you been in contact with someone with a confirmed diagnosis of COVID-19 or PUI in the past 14 days without wearing appropriate PPE? No  Have you been living in the same home as a person with confirmed diagnosis of COVID-19 or a PUI (household contact)? No  Have you been diagnosed with COVID-19? No

## 2019-10-06 NOTE — Progress Notes (Signed)
[facility] MD Progress Note  10/06/2019 10:40 AM Eric Keller  MRN:  [id number] Subjective: patient reports improvement compared to admission. Today spoke at some length about substance abuse issues . States he feels he has an underlying anxiety disorder with frequent excessive worry and a vague sense of apprehension and states he started using BZDs to self treat these symptoms, " but then I developed tolerance, could not stop".  He also expresses some ambivalence about disposition planning - lives with his father and is debating on whether to go to  outpatient treatment or go to a rehab setting . States " in any case I would need to go home first to get my belongings "  Denies medication side effects.   Objective: I have reviewed chart notes and met with patient. 34 year old male, presented to hospital voluntarily reporting worsening depression, neurovegetative symptoms, passive SI, and seeking help for substance abuse and detox.  He reports a history of polysubstance use disorder, stated he was using Xanax and alcohol on a regular basis sometimes up to 8 mg of Xanax daily and up to a pint of liquor per day.  He was also using Suboxone several times per week.  Patient presents alert, attentive , polite on approach. Describes improving symptoms of WDL- no longer feeling significantly shaky or jittery and presents calm, in no discomfort,  less flushed . His vitals are stable - 123/90, pulse 77 . As above, reports history of anxiety ( describes symptoms suggestive of GAD- excessive worrying, generalized worry, vague/namless feelings of apprehension) which he feels are independent from  and which predated BZD/Alcohol abuse . Currently tolerating medications well, denies side effects. Visible in day room, going to groups, polite on approach, no disruptive or agitated behaviors.  Principal Problem: Benzodiazepine, alcohol, opiate use disorder.  BZD/alcohol withdrawal.  Substance-induced depression versus  MDD. Diagnosis: Principal Problem:   MDD (major depressive disorder), recurrent severe, without psychosis ([place]) Active Problems:   Polysubstance dependence ([place])  Total Time spent with patient: 20 minutes  Past Psychiatric History:   Past Medical History:  Past Medical History:  Diagnosis Date  . Alcohol withdrawal ([place]) 07/20/2019  . Anxiety   . Anxiety   . Depression   . H/O: substance abuse ([place])   . Hypertension     Past Surgical History:  Procedure Laterality Date  . WISDOM TOOTH EXTRACTION     Family History:  Family History  Problem Relation Age of Onset  . Cancer Mother   . Hypertension Paternal Grandfather   . Heart disease Paternal Grandfather        MIs  . Heart disease Other   . Stroke Other    Family Psychiatric  History:  Social History:  Social History   Substance and Sexual Activity  Alcohol Use Yes   Comment: daily 5th of liquor     Social History   Substance and Sexual Activity  Drug Use Not Currently  . Types: Marijuana   Comment: Vicodin Abuse, BEnzos    Social History   Socioeconomic History  . Marital status: Single    Spouse name: Not on file  . Number of children: Not on file  . Years of education: Not on file  . Highest education level: Not on file  Occupational History  . Not on file  Tobacco Use  . Smoking status: Never Smoker  . Smokeless tobacco: Never Used  Substance and Sexual Activity  . Alcohol use: Yes    Comment: daily 5th of liquor  .  Drug use: Not Currently    Types: Marijuana    Comment: Vicodin Abuse, BEnzos  . Sexual activity: Not on file  Other Topics Concern  . Not on file  Social History Narrative  . Not on file   Social Determinants of Health   Financial Resource Strain:   . Difficulty of Paying Living Expenses:   Food Insecurity:   . Worried About Charity fundraiser in the Last Year:   . Arboriculturist in the Last Year:   Transportation Needs:   . Film/video editor (Medical):   Marland Kitchen  Lack of Transportation (Non-Medical):   Physical Activity:   . Days of Exercise per Week:   . Minutes of Exercise per Session:   Stress:   . Feeling of Stress :   Social Connections:   . Frequency of Communication with Friends and Family:   . Frequency of Social Gatherings with Friends and Family:   . Attends Religious Services:   . Active Member of Clubs or Organizations:   . Attends Archivist Meetings:   Marland Kitchen Marital Status:    Additional Social History:   Sleep: improving  Appetite:  improving   Current Medications: Current Facility-Administered Medications  Medication Dose Route Frequency Provider Last Rate Last Admin  . cholecalciferol (VITAMIN D3) tablet 400 Units  400 Units Oral Daily Rankin, Shuvon B, NP   400 Units at 10/05/19 0802  . cloNIDine (CATAPRES) tablet 0.1 mg  0.1 mg Oral TID Rankin, Shuvon B, NP   0.1 mg at 10/06/19 0820  . feeding supplement (ENSURE ENLIVE) (ENSURE ENLIVE) liquid 237 mL  237 mL Oral BID BM Andreu Drudge, Myer Peer, MD   237 mL at 10/05/19 0932  . [START ON 10/07/2019] FLUoxetine (PROZAC) capsule 20 mg  20 mg Oral Daily Beatrice Sehgal A, MD      . folic acid (FOLVITE) tablet 1 mg  1 mg Oral Daily Calvin Jablonowski, Myer Peer, MD   1 mg at 10/05/19 0804  . gabapentin (NEURONTIN) capsule 300 mg  300 mg Oral TID Kalieb Freeland, Myer Peer, MD   300 mg at 10/06/19 0816  . hydrOXYzine (ATARAX/VISTARIL) tablet 25 mg  25 mg Oral Q6H PRN Nitisha Civello, Myer Peer, MD   25 mg at 10/06/19 1007  . loperamide (IMODIUM) capsule 2-4 mg  2-4 mg Oral PRN Calloway Andrus, Myer Peer, MD      . LORazepam (ATIVAN) tablet 1 mg  1 mg Oral Q6H PRN Ayan Yankey, Myer Peer, MD   1 mg at 10/05/19 2120  . LORazepam (ATIVAN) tablet 1 mg  1 mg Oral BID Toribio Seiber, Myer Peer, MD   1 mg at 10/06/19 0816   And  . [START ON 10/07/2019] LORazepam (ATIVAN) tablet 1 mg  1 mg Oral Daily Jamyron Redd A, MD      . magnesium hydroxide (MILK OF MAGNESIA) suspension 30 mL  30 mL Oral Daily PRN Rankin, Shuvon B, NP      .  metoprolol tartrate (LOPRESSOR) tablet 50 mg  50 mg Oral BID Rankin, Shuvon B, NP   50 mg at 10/06/19 0816  . multivitamin with minerals tablet 1 tablet  1 tablet Oral Daily Eliza Green, Myer Peer, MD   1 tablet at 10/05/19 0802  . thiamine (B-1) injection 100 mg  100 mg Intramuscular Once Suhayb Anzalone A, MD      . thiamine tablet 100 mg  100 mg Oral Daily Ghazal Pevey, Myer Peer, MD   100 mg at 10/05/19 0803  .  traZODone (DESYREL) tablet 50 mg  50 mg Oral QHS PRN Brenn Gatton, Myer Peer, MD   50 mg at 10/05/19 2120    Lab Results: No results found for this or any previous visit (from the past 48 hour(s)).  Blood Alcohol level:  Lab Results  Component Value Date   ETH <10 10/01/2019   ETH <10 14/97/0263    Metabolic Disorder Labs: No results found for: HGBA1C, MPG No results found for: PROLACTIN No results found for: CHOL, TRIG, HDL, CHOLHDL, VLDL, LDLCALC  Physical Findings: AIMS:  , ,  ,  ,    CIWA:  CIWA-Ar Total: 1 COWS:     Musculoskeletal: Strength & Muscle Tone: within normal limits-  Gait & Station: normal Patient leans: N/A  Psychiatric Specialty Exam: Physical Exam  Review of Systems no chest pain, no shortness of breath, no vomiting   Blood pressure 123/90, pulse 77, temperature (!) 97.4 F (36.3 C), temperature source Oral, resp. rate 14, height 6' 3"  (1.905 m), weight 96.2 kg, SpO2 100 %.Body mass index is 26.5 kg/m.  General Appearance: Casual  Eye Contact:  Good  Speech:  Normal Rate  Volume:  Normal  Mood:  improving mood   Affect:  becoming more reactive, vaguely anxious  Thought Process:  Linear and Descriptions of Associations: Intact  Orientation:  Other:  Fully alert and attentive  Thought Content:  No hallucinations, no delusions, not internally preoccupied  Suicidal Thoughts:  No denies any suicidal or self-injurious ideations at this time, contracts for safety on unit  Homicidal Thoughts:  No  Memory:  Recent and remote grossly intact  Judgement:  Other:   Fair/improving  Insight:  Fair/ improving  Psychomotor Activity:  Normal-no psychomotor agitation, no tremors or diaphoresis noted today  Concentration:  Concentration: Good and Attention Span: Good  Recall:  Good  Fund of Knowledge:  Good  Language:  Good  Akathisia:  Negative  Handed:  Right  AIMS (if indicated):     Assets:  Communication Skills Desire for Improvement Resilience  ADL's:  Intact  Cognition:  WNL  Sleep:  Number of Hours: 6.25   Assessment:  34 year old male, presented to hospital voluntarily reporting worsening depression, neurovegetative symptoms, passive SI, and seeking help for substance abuse and detox.  He reports a history of polysubstance use disorder, stated he was using Xanax and alcohol on a regular basis sometimes up to 8 mg of Xanax daily and up to a pint of liquor per day.  He was also using Suboxone several times per week.  Patient is reporting gradual improvement and today describes improved/subsiding symptoms of WDL and appears calm, comfortable, with stable vital signs. He is describing a history of underlying anxiety, depression predating substance abuse, and describes symptoms suggestive of GAD such as a tendency toward generalized worry, feelings of apprehension. Tolerating Prozac, Neurontin well .  BP well controlled at this time.   Treatment Plan Summary: Daily contact with patient to assess and evaluate symptoms and progress in treatment, Medication management, Plan Inpatient treatment and Medications as below  Treatment plan reviewed as below today 4/17  Encourage group and milieu participation Encourage efforts to work on sobriety and relapse prevention Treatment team working on disposition planning options Continue  Ativan taper as per protocol for alcohol withdrawal Continue thiamine and folate supplementation for alcohol dependence/withdrawal Continue Neurontin 300 mg 3 times daily for anxiety, pain, alcohol dependence Increase  Prozac to  20 mg daily for depression and anxiety Continue Clonidine 0.1 mg  3 times daily for hypertension and symptoms of opiate withdrawal  Continue Imodium as needed for diarrhea if needed Continue Lopressor 50 mg twice daily for hypertension  Jenne Campus, MD 10/06/2019, 10:40 AM   Patient ID: Barnetta Hammersmith, male   DOB: 15-Jul-1985, 34 y.o.   MRN: [id number]

## 2019-10-06 NOTE — Progress Notes (Signed)
   10/05/19 2212  Psych Admission Type (Psych Patients Only)  Admission Status Voluntary  Psychosocial Assessment  Patient Complaints Anxiety  Eye Contact Fair  Affect Appropriate to circumstance  Speech Logical/coherent;Unremarkable  Interaction Assertive  Appearance/Hygiene Unremarkable  Behavior Characteristics Cooperative;Appropriate to situation  Mood Depressed;Anxious  Thought Process  Coherency WDL  Content WDL  Delusions WDL  Perception WDL  Hallucination None reported or observed  Judgment WDL  Confusion WDL  Danger to Self  Current suicidal ideation? Denies  Danger to Others  Danger to Others None reported or observed

## 2019-10-06 NOTE — [facility] Group Notes (Signed)
The focus of this group is to help patients review their daily goal of treatment and discuss progress and goals. Pt was able to attend na/aa wrap up group.

## 2019-10-06 NOTE — [facility] Group Notes (Signed)
LCSW Group Therapy Note  10/06/2019    10:00-11:00am   Type of Therapy and Topic:  Group Therapy: Early Messages Received About Anger  Participation Level:  Active   Description of Group:   In this group, patients shared and discussed the early messages received in their lives about anger through parental or other adult modeling, teaching, repression, punishment, violence, and more.  Participants identified how those childhood lessons influence even now how they usually or often react when angered.  The group discussed that anger is a secondary emotion and what may be the underlying emotional themes that come out through anger outbursts or that are ignored through anger suppression.  Finally, as a group there was a conversation about the workbook's quote that "There is nothing wrong with anger; it is just a sign something needs to change."     Therapeutic Goals: Patients will identify one or more childhood message about anger that they received and how it was taught to them. Patients will discuss how these childhood experiences have influenced and continue to influence their own expression or repression of anger even today. Patients will explore possible primary emotions that tend to fuel their secondary emotion of anger. Patients will learn that anger itself is normal and cannot be eliminated, and that healthier coping skills can assist with resolving conflict rather than worsening situations.  Summary of Patient Progress:  The patient shared that his childhood lessons about anger were from his father.  His mother died when he was quite young, and his father became angry and stayed angry for a long time.  His father's anger was not physical, but it was constant yelling and screaming.  On the other hand, the patient had to stuff his anger down, which he still does until he explodes.  He stated that now when he does get angry, "It's bad."  Therapeutic Modalities:   Cognitive Behavioral  Therapy Motivation Interviewing  Lynnell Chad  .

## 2019-10-07 DIAGNOSIS — F332 Major depressive disorder, recurrent severe without psychotic features: Secondary | ICD-10-CM | POA: Diagnosis not present
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
## 2019-10-07 NOTE — [facility] Group Notes (Signed)
Adult Psychoeducational Group Note  Date:  10/07/2019 Time:  11:41 AM  Group Topic/Focus: PROGRESSIVE RELAXATION.Marland Kitchen A group where deep breathing is taught and tensing and relaxation muscle groups is used. Imagery is used as well. Pt;s are asked to imagine 3 pillars that hold them up when they are not able to hold themselves up.   Participation Level:  Active  Participation Quality:  Appropriate  Affect:  Appropriate  Cognitive:  Appropriate  Insight: Improving  Engagement in Group:  Engaged  Modes of Intervention:  Activity  Additional Comments:  Pt states what holds him up when he cannot hold himself up is his faith, his family and the gym.   Vira Blanco A 10/07/2019, 11:41 AM

## 2019-10-07 NOTE — Progress Notes (Signed)
   10/07/19 2030  COVID-19 Daily Checkoff  Have you had a fever (temp > 37.80C/100F)  in the past 24 hours?  No  If you have had runny nose, nasal congestion, sneezing in the past 24 hours, has it worsened? No  COVID-19 EXPOSURE  Have you traveled outside the state in the past 14 days? No  Have you been in contact with someone with a confirmed diagnosis of COVID-19 or PUI in the past 14 days without wearing appropriate PPE? No  Have you been living in the same home as a person with confirmed diagnosis of COVID-19 or a PUI (household contact)? No  Have you been diagnosed with COVID-19? No

## 2019-10-07 NOTE — Progress Notes (Signed)
   10/07/19 2032  Psych Admission Type (Psych Patients Only)  Admission Status Voluntary  Psychosocial Assessment  Patient Complaints Anxiety  Eye Contact Fair  Facial Expression Anxious  Affect Appropriate to circumstance  Speech Logical/coherent  Interaction Assertive  Motor Activity Other (Comment) (WDL)  Appearance/Hygiene Unremarkable  Behavior Characteristics Appropriate to situation  Mood Pleasant;Anxious  Thought Process  Coherency WDL  Content WDL  Delusions None reported or observed  Perception WDL  Hallucination None reported or observed  Judgment Impaired  Confusion None  Danger to Self  Current suicidal ideation? Denies  Danger to Others  Danger to Others None reported or observed

## 2019-10-07 NOTE — Progress Notes (Addendum)
D. Pt is friendly upon approach- reports lessening of withdrawal symptoms, but continues to complain of persistent anxiety. Per pt's self inventory, pt rated his depression, hopelessness and anxiety a 5/3/8, respectively. .Pt currently denies  SI/HI and AVH   A. Labs and vitals monitored. Pt compliant with medications. Pt supported emotionally and encouraged to express concerns and ask questions.   R. Pt remains safe with 15 minute checks. Will continue POC.

## 2019-10-07 NOTE — [facility] Group Notes (Signed)
[facility] LCSW Group Therapy Note  Date/Time:  10/07/2019 9:00-10:00 or 10:00-11:00AM  Type of Therapy and Topic:  Group Therapy:  Healthy and Unhealthy Supports  Participation Level:  Active   Description of Group:  Patients in this group were introduced to the idea of adding a variety of healthy supports to address the various needs in their lives.Patients discussed what additional healthy supports could be helpful in their recovery and wellness after discharge in order to prevent future hospitalizations.   An emphasis was placed on using counselor, doctor, therapy groups, 12-step groups, and problem-specific support groups to expand supports.  Several songs were played to emphasize points made throughout group.  Therapeutic Goals:   1)  discuss importance of adding supports to stay well once out of the hospital  2)  compare healthy versus unhealthy supports and identify some examples of each  3)  generate ideas and descriptions of healthy supports that can be added  4)  offer mutual support about how to address unhealthy supports  5)  encourage active participation in and adherence to discharge plan    Summary of Patient Progress:  The patient stated that current healthy supports in his ife are his father, brother, and sister while current unhealthy supports include the dope dealers.  The patient expressed a willingness to add residential rehab as support(s) to help in his recovery journey.   Therapeutic Modalities:   Motivational Interviewing Brief Solution-Focused Therapy  Ambrose Mantle, LCSW

## 2019-10-08 DIAGNOSIS — F332 Major depressive disorder, recurrent severe without psychotic features: Secondary | ICD-10-CM | POA: Diagnosis not present

## 2019-10-08 LAB — BASIC METABOLIC PANEL
Anion gap: 9 (ref 5–15)
BUN: 10 mg/dL (ref 6–20)
CO2: 27 mmol/L (ref 22–32)
Calcium: 9.4 mg/dL (ref 8.9–10.3)
Chloride: 103 mmol/L (ref 98–111)
Creatinine, Ser: 1.23 mg/dL (ref 0.61–1.24)
GFR calc Af Amer: >60 mL/min (ref >60)
GFR calc non Af Amer: >60 mL/min (ref >60)
Glucose, Bld: 111 mg/dL — ABNORMAL HIGH (ref 70–99)
Potassium: 4.3 mmol/L (ref 3.5–5.1)
Sodium: 139 mmol/L (ref 135–145)

## 2019-10-08 NOTE — Progress Notes (Signed)
Recreation Therapy Notes  Date:  4.19.21 Time: 0930 Location: [address] Group Room  Group Topic: Stress Management  Goal Area(s) Addresses:  Patient will identify positive stress management techniques. Patient will identify benefits of using stress management post d/c.  Intervention:  Stress Management  Activity :  Guided Imagery.  LRT was to read a script that focused on envisioning your peaceful place.  Patients were to listen and follow along as script was read to engage in activity.  Education:  Stress Management, Discharge Planning.   Education Outcome: Acknowledges Education  Clinical Observations/Feedback: Pt did not attend group activity.    Caroll Rancher, LRT/CTRS         Caroll Rancher A 10/08/2019 11:26 AM

## 2019-10-08 NOTE — [facility] Group Notes (Signed)
LCSW Group Therapy Note 10/08/2019 2:13 PM  Type of Therapy and Topic: Group Therapy: Overcoming Obstacles  Participation Level: Did Not Attend  Description of Group:  In this group patients will be encouraged to explore what they see as obstacles to their own wellness and recovery. They will be guided to discuss their thoughts, feelings, and behaviors related to these obstacles. The group will process together ways to cope with barriers, with attention given to specific choices patients can make. Each patient will be challenged to identify changes they are motivated to make in order to overcome their obstacles. This group will be process-oriented, with patients participating in exploration of their own experiences as well as giving and receiving support and challenge from other group members.  Therapeutic Goals: 1. Patient will identify personal and current obstacles as they relate to admission. 2. Patient will identify barriers that currently interfere with their wellness or overcoming obstacles.  3. Patient will identify feelings, thought process and behaviors related to these barriers. 4. Patient will identify two changes they are willing to make to overcome these obstacles:   Summary of Patient Progress  Invited, chose not to attend.    Therapeutic Modalities:  Cognitive Behavioral Therapy Solution Focused Therapy Motivational Interviewing Relapse Prevention Therapy   Alcario Drought Clinical Social Worker

## 2019-10-08 NOTE — Tx Team (Signed)
Interdisciplinary Treatment and Diagnostic Plan Update  10/08/2019 Time of Session: 9:30am Eric Keller MRN: [id number]  Principal Diagnosis: MDD (major depressive disorder), recurrent severe, without psychosis ([place])  Secondary Diagnoses: Principal Problem:   MDD (major depressive disorder), recurrent severe, without psychosis ([place]) Active Problems:   Polysubstance dependence ([place])   Current Medications:  Current Facility-Administered Medications  Medication Dose Route Frequency Provider Last Rate Last Admin  . cholecalciferol (VITAMIN D3) tablet 400 Units  400 Units Oral Daily Rankin, Shuvon B, NP   400 Units at 10/08/19 0802  . cloNIDine (CATAPRES) tablet 0.1 mg  0.1 mg Oral TID Rankin, Shuvon B, NP   0.1 mg at 10/08/19 0803  . feeding supplement (ENSURE ENLIVE) (ENSURE ENLIVE) liquid 237 mL  237 mL Oral BID BM Cobos, Myer Peer, MD   237 mL at 10/05/19 0932  . FLUoxetine (PROZAC) capsule 20 mg  20 mg Oral Daily Cobos, Myer Peer, MD   20 mg at 10/08/19 0802  . folic acid (FOLVITE) tablet 1 mg  1 mg Oral Daily Cobos, Myer Peer, MD   1 mg at 10/08/19 0803  . gabapentin (NEURONTIN) capsule 300 mg  300 mg Oral TID Cobos, Myer Peer, MD   300 mg at 10/08/19 0802  . hydrOXYzine (ATARAX/VISTARIL) tablet 25 mg  25 mg Oral Q6H PRN Lindon Romp A, NP   25 mg at 10/08/19 0916  . magnesium hydroxide (MILK OF MAGNESIA) suspension 30 mL  30 mL Oral Daily PRN Rankin, Shuvon B, NP      . metoprolol tartrate (LOPRESSOR) tablet 50 mg  50 mg Oral BID Rankin, Shuvon B, NP   50 mg at 10/08/19 0803  . multivitamin with minerals tablet 1 tablet  1 tablet Oral Daily Cobos, Myer Peer, MD   1 tablet at 10/08/19 0805  . thiamine (B-1) injection 100 mg  100 mg Intramuscular Once Cobos, Fernando A, MD      . thiamine tablet 100 mg  100 mg Oral Daily Cobos, Myer Peer, MD   100 mg at 10/08/19 0804  . traZODone (DESYREL) tablet 100 mg  100 mg Oral QHS PRN Lindon Romp A, NP   100 mg at 10/07/19 2100   PTA  Medications: Medications Prior to Admission  Medication Sig Dispense Refill Last Dose  . busPIRone (BUSPAR) 10 MG tablet Take 1 tablet (10 mg total) by mouth 3 (three) times daily. 90 tablet 0   . Cholecalciferol (VITAMIN D3) 10 MCG (400 UNIT) CAPS Take 1 tablet by mouth daily. 150 capsule 0   . cloNIDine (CATAPRES) 0.1 MG tablet Take 1 tablet (0.1 mg total) by mouth 3 (three) times daily. Do not stop suddenly 90 tablet 0   . doxycycline (VIBRA-TABS) 100 MG tablet Take 1 tablet (100 mg total) by mouth 2 (two) times daily. (Patient not taking: Reported on 10/01/2019) 20 tablet 0   . FLUoxetine (PROZAC) 10 MG capsule Take 1 capsule (10 mg total) by mouth daily. 30 capsule 3   . gabapentin (NEURONTIN) 300 MG capsule Take 1 capsule (300 mg total) by mouth 3 (three) times daily. 90 capsule 0   . hydrOXYzine (ATARAX/VISTARIL) 50 MG tablet Take 1 tablet (50 mg total) by mouth every 8 (eight) hours as needed for anxiety. 30 tablet 0   . metoprolol tartrate (LOPRESSOR) 50 MG tablet Take 1 tablet (50 mg total) by mouth 2 (two) times daily. 180 tablet 0   . thiamine 100 MG tablet Take 1 tablet (100 mg total) by mouth daily.  30 tablet 0   . traZODone (DESYREL) 100 MG tablet Take 1 tablet (100 mg total) by mouth at bedtime as needed for sleep. 30 tablet 0     Patient Stressors: Financial difficulties Occupational concerns Other: all due to COVID  Patient Strengths: Average or above average intelligence Capable of independent living Communication skills Motivation for treatment/growth Supportive family/friends  Treatment Modalities: Medication Management, Group therapy, Case management,  1 to 1 session with clinician, Psychoeducation, Recreational therapy.   Physician Treatment Plan for Primary Diagnosis: MDD (major depressive disorder), recurrent severe, without psychosis (HCC) Long Term Goal(s): Improvement in symptoms so as ready for discharge Improvement in symptoms so as ready for discharge    Short Term Goals: Ability to identify changes in lifestyle to reduce recurrence of condition will improve Ability to verbalize feelings will improve Ability to disclose and discuss suicidal ideas Ability to demonstrate self-control will improve Ability to identify and develop effective coping behaviors will improve Ability to maintain clinical measurements within normal limits will improve Compliance with prescribed medications will improve Ability to identify triggers associated with substance abuse/mental health issues will improve Ability to identify changes in lifestyle to reduce recurrence of condition will improve Ability to verbalize feelings will improve Ability to disclose and discuss suicidal ideas Ability to demonstrate self-control will improve Ability to identify and develop effective coping behaviors will improve Ability to maintain clinical measurements within normal limits will improve Compliance with prescribed medications will improve Ability to identify triggers associated with substance abuse/mental health issues will improve  Medication Management: Evaluate patient's response, side effects, and tolerance of medication regimen.  Therapeutic Interventions: 1 to 1 sessions, Unit Group sessions and Medication administration.  Evaluation of Outcomes: Adequate for Discharge  Physician Treatment Plan for Secondary Diagnosis: Principal Problem:   MDD (major depressive disorder), recurrent severe, without psychosis (HCC) Active Problems:   Polysubstance dependence (HCC)  Long Term Goal(s): Improvement in symptoms so as ready for discharge Improvement in symptoms so as ready for discharge   Short Term Goals: Ability to identify changes in lifestyle to reduce recurrence of condition will improve Ability to verbalize feelings will improve Ability to disclose and discuss suicidal ideas Ability to demonstrate self-control will improve Ability to identify and develop  effective coping behaviors will improve Ability to maintain clinical measurements within normal limits will improve Compliance with prescribed medications will improve Ability to identify triggers associated with substance abuse/mental health issues will improve Ability to identify changes in lifestyle to reduce recurrence of condition will improve Ability to verbalize feelings will improve Ability to disclose and discuss suicidal ideas Ability to demonstrate self-control will improve Ability to identify and develop effective coping behaviors will improve Ability to maintain clinical measurements within normal limits will improve Compliance with prescribed medications will improve Ability to identify triggers associated with substance abuse/mental health issues will improve     Medication Management: Evaluate patient's response, side effects, and tolerance of medication regimen.  Therapeutic Interventions: 1 to 1 sessions, Unit Group sessions and Medication administration.  Evaluation of Outcomes: Adequate for Discharge   RN Treatment Plan for Primary Diagnosis: MDD (major depressive disorder), recurrent severe, without psychosis (HCC) Long Term Goal(s): Knowledge of disease and therapeutic regimen to maintain health will improve  Short Term Goals: Ability to participate in decision making will improve, Ability to disclose and discuss suicidal ideas, Ability to identify and develop effective coping behaviors will improve and Compliance with prescribed medications will improve  Medication Management: RN will administer medications as  ordered by provider, will assess and evaluate patient's response and provide education to patient for prescribed medication. RN will report any adverse and/or side effects to prescribing provider.  Therapeutic Interventions: 1 on 1 counseling sessions, Psychoeducation, Medication administration, Evaluate responses to treatment, Monitor vital signs and CBGs as  ordered, Perform/monitor CIWA, COWS, AIMS and Fall Risk screenings as ordered, Perform wound care treatments as ordered.  Evaluation of Outcomes: Adequate for Discharge   LCSW Treatment Plan for Primary Diagnosis: MDD (major depressive disorder), recurrent severe, without psychosis (HCC) Long Term Goal(s): Safe transition to appropriate next level of care at discharge, Engage patient in therapeutic group addressing interpersonal concerns.  Short Term Goals: Engage patient in aftercare planning with referrals and resources  Therapeutic Interventions: Assess for all discharge needs, 1 to 1 time with Social worker, Explore available resources and support systems, Assess for adequacy in community support network, Educate family and significant other(s) on suicide prevention, Complete Psychosocial Assessment, Interpersonal group therapy.  Evaluation of Outcomes: Adequate for Discharge   Progress in Treatment: Attending groups: Yes.   Participating in groups: Yes. Taking medication as prescribed: Yes. Toleration medication: Yes. Family/Significant other contact made: Yes, individual(s) contacted:  the patient's father Patient understands diagnosis: Yes. Discussing patient identified problems/goals with staff: Yes. Medical problems stabilized or resolved: Yes. Denies suicidal/homicidal ideation: Yes. Issues/concerns per patient self-inventory: No. Other:   New problem(s) identified: None   New Short Term/Long Term Goal(s): Detox,  medication stabilization, elimination of SI thoughts, development of comprehensive mental wellness plan.    Patient Goals:  "To get clean for one  Discharge Plan or Barriers: Patient plans to return home briefly, however he will go [facility] for substance abuse treatment on Wednesday, 10/09/19. He has resources for walk-in hours at [facility] for medication management and therapy services.    Reason for Continuation of Hospitalization: None    Estimated Length of Stay: Discharge, Tuesday 10/09/19  Attendees: Patient: Eric Keller 10/08/2019 10:58 AM  Physician: Dr. Nehemiah Massed, MD 10/08/2019 10:58 AM  Nursing:  10/08/2019 10:58 AM  RN Care Manager: 10/08/2019 10:58 AM  Social Worker: Baldo Daub, LCSW 10/08/2019 10:58 AM  Recreational Therapist:  10/08/2019 10:58 AM  Other:  10/08/2019 10:58 AM  Other:  10/08/2019 10:58 AM  Other: 10/08/2019 10:58 AM    Scribe for Treatment Team: Maeola Sarah, LCSWA 10/08/2019 10:58 AM

## 2019-10-08 NOTE — Progress Notes (Signed)
[facility] MD Progress Note  10/08/2019 9:18 AM Eric Keller  MRN:  [id number] Subjective: endorses improvement compared to admission. Currently denies SI and is focusing more on disposition planning options. Denies medication side effects.  Objective: I have reviewed chart notes and met with patient. 34 year old male, presented to hospital voluntarily reporting worsening depression, neurovegetative symptoms, passive SI, and seeking help for substance abuse and detox.  He reports a history of polysubstance use disorder, stated he was using Xanax and alcohol on a regular basis sometimes up to 8 mg of Xanax daily and up to a pint of liquor per day.  He was also using Suboxone several times per week.  Today patient presents alert, attentive, calm, pleasant on approach. Denies significant or lingering symptoms of withdrawal.  Does not appear to be in any acute distress or discomfort.  No tremors or diaphoresis are noted. Calm and polite on unit, no psychomotor agitation .  Denies suicidal ideations.  Currently presents future oriented and is mostly focused on disposition planning options.  He is interested in going to a rehab setting.  I saw patient along with CSW.  Patient has available bed at [facility] but is currently declining this option as he states it is too far from his home .  He is interested in referral to [facility].  He understands that a bed may not be immediately available there but reports he could probably stay at his father's for a few days until bed available. Repeat BUN/Creatinine normalized. Creatinine 1.23, BUN 10.   Principal Problem: Benzodiazepine, alcohol, opiate use disorder.  BZD/alcohol withdrawal.  Substance-induced depression versus MDD. Diagnosis: Principal Problem:   MDD (major depressive disorder), recurrent severe, without psychosis ([place]) Active Problems:   Polysubstance dependence ([place])  Total Time spent with patient: 15 minutes  Past Psychiatric History:   Past Medical  History:  Past Medical History:  Diagnosis Date  . Alcohol withdrawal ([place]) 07/20/2019  . Anxiety   . Anxiety   . Depression   . H/O: substance abuse ([place])   . Hypertension     Past Surgical History:  Procedure Laterality Date  . WISDOM TOOTH EXTRACTION     Family History:  Family History  Problem Relation Age of Onset  . Cancer Mother   . Hypertension Paternal Grandfather   . Heart disease Paternal Grandfather        MIs  . Heart disease Other   . Stroke Other    Family Psychiatric  History:  Social History:  Social History   Substance and Sexual Activity  Alcohol Use Yes   Comment: daily 5th of liquor     Social History   Substance and Sexual Activity  Drug Use Not Currently  . Types: Marijuana   Comment: Vicodin Abuse, BEnzos    Social History   Socioeconomic History  . Marital status: Single    Spouse name: Not on file  . Number of children: Not on file  . Years of education: Not on file  . Highest education level: Not on file  Occupational History  . Not on file  Tobacco Use  . Smoking status: Never Smoker  . Smokeless tobacco: Never Used  Substance and Sexual Activity  . Alcohol use: Yes    Comment: daily 5th of liquor  . Drug use: Not Currently    Types: Marijuana    Comment: Vicodin Abuse, BEnzos  . Sexual activity: Not on file  Other Topics Concern  . Not on file  Social History Narrative  .  Not on file   Social Determinants of Health   Financial Resource Strain:   . Difficulty of Paying Living Expenses:   Food Insecurity:   . Worried About Charity fundraiser in the Last Year:   . Arboriculturist in the Last Year:   Transportation Needs:   . Film/video editor (Medical):   Marland Kitchen Lack of Transportation (Non-Medical):   Physical Activity:   . Days of Exercise per Week:   . Minutes of Exercise per Session:   Stress:   . Feeling of Stress :   Social Connections:   . Frequency of Communication with Friends and Family:   . Frequency  of Social Gatherings with Friends and Family:   . Attends Religious Services:   . Active Member of Clubs or Organizations:   . Attends Archivist Meetings:   Marland Kitchen Marital Status:    Additional Social History:   Sleep: improving  Appetite:  improving   Current Medications: Current Facility-Administered Medications  Medication Dose Route Frequency Provider Last Rate Last Admin  . cholecalciferol (VITAMIN D3) tablet 400 Units  400 Units Oral Daily Rankin, Shuvon B, NP   400 Units at 10/08/19 0802  . cloNIDine (CATAPRES) tablet 0.1 mg  0.1 mg Oral TID Rankin, Shuvon B, NP   0.1 mg at 10/08/19 0803  . feeding supplement (ENSURE ENLIVE) (ENSURE ENLIVE) liquid 237 mL  237 mL Oral BID BM Ladawn Boullion, Myer Peer, MD   237 mL at 10/05/19 0932  . FLUoxetine (PROZAC) capsule 20 mg  20 mg Oral Daily Shandel Busic, Myer Peer, MD   20 mg at 10/08/19 0802  . folic acid (FOLVITE) tablet 1 mg  1 mg Oral Daily Etana Beets, Myer Peer, MD   1 mg at 10/08/19 0803  . gabapentin (NEURONTIN) capsule 300 mg  300 mg Oral TID Brailynn Breth, Myer Peer, MD   300 mg at 10/08/19 0802  . hydrOXYzine (ATARAX/VISTARIL) tablet 25 mg  25 mg Oral Q6H PRN Lindon Romp A, NP   25 mg at 10/08/19 0916  . magnesium hydroxide (MILK OF MAGNESIA) suspension 30 mL  30 mL Oral Daily PRN Rankin, Shuvon B, NP      . metoprolol tartrate (LOPRESSOR) tablet 50 mg  50 mg Oral BID Rankin, Shuvon B, NP   50 mg at 10/08/19 0803  . multivitamin with minerals tablet 1 tablet  1 tablet Oral Daily Dempsey Ahonen, Myer Peer, MD   1 tablet at 10/08/19 0805  . thiamine (B-1) injection 100 mg  100 mg Intramuscular Once Suzzanne Brunkhorst A, MD      . thiamine tablet 100 mg  100 mg Oral Daily Shandrea Lusk, Myer Peer, MD   100 mg at 10/08/19 0804  . traZODone (DESYREL) tablet 100 mg  100 mg Oral QHS PRN Lindon Romp A, NP   100 mg at 10/07/19 2100    Lab Results:  Results for orders placed or performed during the hospital encounter of 10/02/19 (from the past 48 hour(s))  Magnesium      Status: None   Collection Time: 10/06/19  6:27 PM  Result Value Ref Range   Magnesium 2.2 1.7 - 2.4 mg/dL    Comment: Performed at [facility], [place] [address] [place],  [address]  Basic metabolic panel     Status: Abnormal   Collection Time: 10/06/19  6:27 PM  Result Value Ref Range   Sodium 138 135 - 145 mmol/L   Potassium 4.0 3.5 - 5.1 mmol/L  Chloride 102 98 - 111 mmol/L   CO2 27 22 - 32 mmol/L   Glucose, Bld 124 (H) 70 - 99 mg/dL    Comment: Glucose reference range applies only to samples taken after fasting for at least 8 hours.   BUN 12 6 - 20 mg/dL   Creatinine, Ser 1.47 (H) 0.61 - 1.24 mg/dL   Calcium 9.5 8.9 - 10.3 mg/dL   GFR calc non Af Amer >60 >60 mL/min   GFR calc Af Amer >60 >60 mL/min   Anion gap 9 5 - 15    Comment: Performed at [facility], [place] [address] [place], [place] [address]  Basic metabolic panel     Status: Abnormal   Collection Time: 10/08/19  6:29 AM  Result Value Ref Range   Sodium 139 135 - 145 mmol/L   Potassium 4.3 3.5 - 5.1 mmol/L   Chloride 103 98 - 111 mmol/L   CO2 27 22 - 32 mmol/L   Glucose, Bld 111 (H) 70 - 99 mg/dL    Comment: Glucose reference range applies only to samples taken after fasting for at least 8 hours.   BUN 10 6 - 20 mg/dL   Creatinine, Ser 1.23 0.61 - 1.24 mg/dL   Calcium 9.4 8.9 - 10.3 mg/dL   GFR calc non Af Amer >60 >60 mL/min   GFR calc Af Amer >60 >60 mL/min   Anion gap 9 5 - 15    Comment: Performed at [facility], [place] [address], [place], [place] [address]    Blood Alcohol level:  Lab Results  Component Value Date   ETH <10 10/01/2019   ETH <10 09/81/1914    Metabolic Disorder Labs: No results found for: HGBA1C, MPG No results found for: PROLACTIN No results found for: CHOL, TRIG, HDL, CHOLHDL, VLDL, LDLCALC  Physical Findings: AIMS: Facial and Oral Movements Muscles of Facial Expression: None, normal Lips and Perioral Area: None,  normal Jaw: None, normal Tongue: None, normal,Extremity Movements Upper (arms, wrists, hands, fingers): None, normal Lower (legs, knees, ankles, toes): None, normal, Trunk Movements Neck, shoulders, hips: None, normal, Overall Severity Severity of abnormal movements (highest score from questions above): None, normal Incapacitation due to abnormal movements: None, normal Patient's awareness of abnormal movements (rate only patient's report): No Awareness, Dental Status Current problems with teeth and/or dentures?: No Does patient usually wear dentures?: No  CIWA:  CIWA-Ar Total: 0 COWS:  COWS Total Score: 2  Musculoskeletal: Strength & Muscle Tone: within normal limits- no tremors, no diaphoresis, no restlessness or psychomotor agitation Gait & Station: normal Patient leans: N/A  Psychiatric Specialty Exam: Physical Exam  Review of Systems no headache, no chest pain, no shortness of breath, no vomiting, no fever or chills    Blood pressure (!) 131/100, pulse 71, temperature 97.8 F (36.6 C), temperature source Oral, resp. rate 16, height 6' 3"  (1.905 m), weight 96.2 kg, SpO2 99 %.Body mass index is 26.5 kg/m.  General Appearance: Casual  Eye Contact:  Good  Speech:  Normal Rate  Volume:  Normal  Mood:  Improving mood  Affect:  Reactive, vaguely anxious  Thought Process:  Linear and Descriptions of Associations: Intact  Orientation:  Other:  Fully alert and attentive  Thought Content:  No hallucinations, no delusions, not internally preoccupied  Suicidal Thoughts:  No denies any suicidal or self-injurious ideations at this time, contracts for safety on unit  Homicidal Thoughts:  No  Memory:  Recent and remote grossly intact  Judgement:  Other:  improving   Insight:  Fair/ improving  Psychomotor Activity:  Normal-presents comfortable, not restless or in any acute distress, no tremors, no diaphoresis  Concentration:  Concentration: Good and Attention Span: Good  Recall:  Good   Fund of Knowledge:  Good  Language:  Good  Akathisia:  Negative  Handed:  Right  AIMS (if indicated):     Assets:  Communication Skills Desire for Improvement Resilience  ADL's:  Intact  Cognition:  WNL  Sleep:  Number of Hours: 5.5   Assessment:  34 year old male, presented to hospital voluntarily reporting worsening depression, neurovegetative symptoms, passive SI, and seeking help for substance abuse and detox.  He reports a history of polysubstance use disorder, stated he was using Xanax and alcohol on a regular basis sometimes up to 8 mg of Xanax daily and up to a pint of liquor per day.  He was also using Suboxone several times per week.  Patient reports overall improvement.  At this time he is not presenting with symptoms of withdrawal and appears calm, comfortable and in no acute distress.  His mood is improved, he remains vaguely anxious in the context of discharge planning.  He is interested in going to a rehab setting.  He declined [facility] , is hoping to go to [facility].  CSW working on this request, and patient states he would be able to stay with his father while bed becomes available.  His creatinine had increased, (which he attributed to not drinking enough fluids)  now back to normal.  Tolerating Prozac/Neurontin well.  He is also on clonidine/Lopressor for hypertension.  Denies medication side effects.  Treatment Plan Summary: Daily contact with patient to assess and evaluate symptoms and progress in treatment, Medication management, Plan Inpatient treatment and Medications as below  Treatment plan reviewed as below today 4/19 Encourage group and milieu participation Encourage efforts to work on sobriety and relapse prevention Treatment team working on disposition planning options- patient is expressing interest in being referred to a rehab at discharge- see above Continue Neurontin 300 mg 3 times daily for anxiety, pain, alcohol dependence Continue Prozac 20 mg daily for  depression and anxiety Continue Clonidine 0.1 mg 3 times daily for hypertension and symptoms of opiate withdrawal  Continue Imodium as needed for diarrhea if needed Continue Lopressor 50 mg twice daily for hypertension  Jenne Campus, MD 10/08/2019, 9:18 AM   Patient ID: Eric Keller, male   DOB: 1986-05-15, 34 y.o.   MRN: [id number]

## 2019-10-08 NOTE — [facility] Group Notes (Signed)
Adult Psychoeducational Group Note  Date:  10/08/2019 Time:  8:59 PM  Group Topic/Focus:  Wrap-Up Group:   The focus of this group is to help patients review their daily goal of treatment and discuss progress on daily workbooks.  Participation Level:  Active  Participation Quality:  Appropriate and Attentive  Affect:  Appropriate  Cognitive:  Alert and Appropriate  Insight: Appropriate and Good  Engagement in Group:  Engaged  Modes of Intervention:  Discussion and Education  Additional Comments:  Pt attended and participated in wrap up group this evening and rated their day a 7/10. Pt received a discharge date, and is anticipating leaving tomorrow. Pt completed their goal, which was to receive a discharge date.   Chrisandra Netters 10/08/2019, 8:59 PM

## 2019-10-08 NOTE — Progress Notes (Signed)
DAR NOTE: Patient presents with anxious affect and depressed mood.  Denies suicidal thoughts, pain, auditory and visual hallucinations.  Rates depression at 3, hopelessness at 2, and anxiety at 7.  Maintained on routine safety checks.  Medications given as prescribed.  Support and encouragement offered as needed.   States goal for today is "discharge plan."  Patient observed socializing with peers in the dayroom.  Requested and received Vistaril 25 mg for complain of anxiety with good effect.  Patient is safe on and off the unit.

## 2019-10-09 DIAGNOSIS — F332 Major depressive disorder, recurrent severe without psychotic features: Secondary | ICD-10-CM | POA: Diagnosis not present

## 2019-10-09 MED ORDER — FLUOXETINE HCL 20 MG PO CAPS: 20.0000 mg | ORAL_CAPSULE | Freq: Every day | ORAL | 0 refills | Status: DC

## 2019-10-09 MED ORDER — HYDROXYZINE HCL 25 MG PO TABS: 25.0000 mg | ORAL_TABLET | Freq: Four times a day (QID) | ORAL | 0 refills | Status: DC | PRN

## 2019-10-09 MED ORDER — CHOLECALCIFEROL 10 MCG (400 UNIT) PO TABS: 400.0000 [IU] | ORAL_TABLET | Freq: Every day | ORAL | 0 refills | Status: DC

## 2019-10-09 MED ORDER — CLONIDINE HCL 0.1 MG PO TABS: 0.1000 mg | ORAL_TABLET | Freq: Three times a day (TID) | ORAL | 0 refills | Status: DC

## 2019-10-09 MED ORDER — TRAZODONE HCL 100 MG PO TABS: 100.0000 mg | ORAL_TABLET | Freq: Every evening | ORAL | 0 refills | Status: DC | PRN

## 2019-10-09 MED ORDER — THIAMINE HCL 100 MG PO TABS: 100.0000 mg | ORAL_TABLET | Freq: Every day | ORAL | 0 refills | Status: DC

## 2019-10-09 MED ORDER — METOPROLOL TARTRATE 50 MG PO TABS: 50.0000 mg | ORAL_TABLET | Freq: Two times a day (BID) | ORAL | 0 refills | Status: DC

## 2019-10-09 MED ORDER — GABAPENTIN 300 MG PO CAPS: 300.0000 mg | ORAL_CAPSULE | Freq: Three times a day (TID) | ORAL | 0 refills | Status: DC

## 2019-10-09 NOTE — Discharge Summary (Signed)
Physician Discharge Summary Note  Patient:  Eric Keller is an 34 y.o., male MRN:  [id number] DOB:  Nov 26, 1985 Patient phone:  [phone] (home)  Patient address:   [place] [address],  Total Time spent with patient: 15 minutes  Date of Admission:  10/02/2019 Date of Discharge: 10/09/19  Reason for Admission:  Polysubstance dependence with suicidal ideation  Principal Problem: MDD (major depressive disorder), recurrent severe, without psychosis ([place]) Discharge Diagnoses: Principal Problem:   MDD (major depressive disorder), recurrent severe, without psychosis ([place]) Active Problems:   Polysubstance dependence ([place])   Past Psychiatric History: MDD, GAD, Polysubstance Abuse-alcohol, opiates, benzodiazepines. Denies psychiatric inpatient admission. Substance abuse treatment at [facility] in 2015.   Past Medical History:  Past Medical History:  Diagnosis Date  . Alcohol withdrawal ([place]) 07/20/2019  . Anxiety   . Anxiety   . Depression   . H/O: substance abuse ([place])   . Hypertension     Past Surgical History:  Procedure Laterality Date  . WISDOM TOOTH EXTRACTION     Family History:  Family History  Problem Relation Age of Onset  . Cancer Mother   . Hypertension Paternal Grandfather   . Heart disease Paternal Grandfather        MIs  . Heart disease Other   . Stroke Other    Family Psychiatric  History: Sister-history of opiate abuse, Father's family alcohol abuse, Maternal Uncle-completed suicide last year Social History:  Social History   Substance and Sexual Activity  Alcohol Use Yes   Comment: daily 5th of liquor     Social History   Substance and Sexual Activity  Drug Use Not Currently  . Types: Marijuana   Comment: Vicodin Abuse, BEnzos    Social History   Socioeconomic History  . Marital status: Single    Spouse name: Not on file  . Number of children: Not on file  . Years of education: Not on file  . Highest education level:  Not on file  Occupational History  . Not on file  Tobacco Use  . Smoking status: Never Smoker  . Smokeless tobacco: Never Used  Substance and Sexual Activity  . Alcohol use: Yes    Comment: daily 5th of liquor  . Drug use: Not Currently    Types: Marijuana    Comment: Vicodin Abuse, BEnzos  . Sexual activity: Not on file  Other Topics Concern  . Not on file  Social History Narrative  . Not on file   Social Determinants of Health   Financial Resource Strain:   . Difficulty of Paying Living Expenses:   Food Insecurity:   . Worried About Charity fundraiser in the Last Year:   . Arboriculturist in the Last Year:   Transportation Needs:   . Film/video editor (Medical):   Marland Kitchen Lack of Transportation (Non-Medical):   Physical Activity:   . Days of Exercise per Week:   . Minutes of Exercise per Session:   Stress:   . Feeling of Stress :   Social Connections:   . Frequency of Communication with Friends and Family:   . Frequency of Social Gatherings with Friends and Family:   . Attends Religious Services:   . Active Member of Clubs or Organizations:   . Attends Archivist Meetings:   Marland Kitchen Marital Status:     Hospital Course:  From admission H&P: Eric Keller is a 34 y.o. male who presented voluntarily to [facility] as a walk-in  on 10/01/2019 with reports of suicidal ideations with thoughts of "running his car off an embankment." Patient was sent to [facility] for medical clearance. He was admitted to [facility] on 10/02/2019. On evaluation, he is lying in the bed in his room. He is alert and oriented x 4, pleasant, and cooperative. Patient reports that he has been feeling depressed for several months. Endorses anhedonia, crying episodes, isolation, decreased appetite, poor concentration, excessive worry, irritability, and impulsivity. Patient reports that he has been using suboxone, heroin, xananx, clonzepam, and alcohol for the past 1.5 years. States that he has used these substances in the  past but was sober from 2015 to 2019. States that he was at [facility] in 2015. States that he uses approximately 8 mg of xanax or clonazepam daily. Reports last use was 09/30/2019. States that he uses approximately "2 mg" of suboxone most days. States that he occasionally uses heroin. Does not recall last use of heroin. States that he occasionally injects heroin. States he last injected heroin about 2 months ago. States that he drinks about a pint of liquor most days. Urine drug screen was positive for benzodiazepines and THC. Negative for other substances. BAL<10.  He reports withdrawal symptoms of diaphoresis, skin "burning," "brain zaps," tremor, and nausea. Noted mild tremor. Patient was medically admitted at [facility] 06/2019 for alcohol withdrawal and alcohol "starvation." He reports PTA medications are Prozac, Buspar, gabapentin, clonidine, and metoprolol. States that he takes them as prescribed. He denies current SI. He is able to verbally contract for safety while in the hospital. He denies homicidal ideations. He denies auditory and visual hallucinations. No indication that he is responding to internal stimuli.   Eric Keller was admitted for polysubstance dependence with suicidal ideation. He remained on the [facility] unit for seven days. He was started on CIWA protocol with Ativan PRN CIWA>10 for ETOH/BZD withdrawal. Clonidine was continued for opioid withdrawal. Prozac, Neurontin, PRN trazodone, and PRN Vistaril were started. He participated in group therapy on the unit. He responded well to treatment with no adverse effects reported. He has shown improved mood, affect, sleep, and interaction. He requested referrals to residential rehab programs. He was accepted to [facility] but declined bed due to distance from home. He has been accepted to [facility]. He is discharging to his father's home until intake at [facility]. He denies any SI/HI/AVH and contracts for safety. He denies withdrawal symptoms. He is  discharging on the medications listed below. He agrees to follow up at [facility] (see below). Patient is provided with prescriptions for medications upon discharge. He is discharging to his father's home via personal transportation.  Physical Findings: AIMS: Facial and Oral Movements Muscles of Facial Expression: None, normal Lips and Perioral Area: None, normal Jaw: None, normal Tongue: None, normal,Extremity Movements Upper (arms, wrists, hands, fingers): None, normal Lower (legs, knees, ankles, toes): None, normal, Trunk Movements Neck, shoulders, hips: None, normal, Overall Severity Severity of abnormal movements (highest score from questions above): None, normal Incapacitation due to abnormal movements: None, normal Patient's awareness of abnormal movements (rate only patient's report): No Awareness, Dental Status Current problems with teeth and/or dentures?: No Does patient usually wear dentures?: No  CIWA:  CIWA-Ar Total: 6 COWS:  COWS Total Score: 2  Musculoskeletal: Strength & Muscle Tone: within normal limits Gait & Station: normal Patient leans: N/A  Psychiatric Specialty Exam: Physical Exam  Nursing note and vitals reviewed. Constitutional: He is oriented to person, place, and time. He appears well-developed and well-nourished.  Cardiovascular: Normal rate.  Respiratory: Effort normal.  Neurological: He is alert and oriented to person, place, and time.    Review of Systems  Constitutional: Negative.   Respiratory: Negative for cough and shortness of breath.   Psychiatric/Behavioral: Negative for agitation, behavioral problems, confusion, dysphoric mood, hallucinations, self-injury, sleep disturbance and suicidal ideas. The patient is not nervous/anxious and is not hyperactive.     Blood pressure (!) 138/93, pulse 75, temperature 97.9 F (36.6 C), temperature source Oral, resp. rate 16, height 6\' 3"  (1.905 m), weight 96.2 kg, SpO2 99 %.Body mass index is  26.5 kg/m.  See MD's discharge SRA    Have you used any form of tobacco in the last 30 days? (Cigarettes, Smokeless Tobacco, Cigars, and/or Pipes): No  Has this patient used any form of tobacco in the last 30 days? (Cigarettes, Smokeless Tobacco, Cigars, and/or Pipes)  No  Blood Alcohol level:  Lab Results  Component Value Date   ETH <10 10/01/2019   ETH <10 07/28/2019    Metabolic Disorder Labs:  No results found for: HGBA1C, MPG No results found for: PROLACTIN No results found for: CHOL, TRIG, HDL, CHOLHDL, VLDL, LDLCALC  See Psychiatric Specialty Exam and Suicide Risk Assessment completed by Attending Physician prior to discharge.  Discharge destination:  Home  Is patient on multiple antipsychotic therapies at discharge:  No   Has Patient had three or more failed trials of antipsychotic monotherapy by history:  No  Recommended Plan for Multiple Antipsychotic Therapies: NA  Discharge Instructions    Discharge instructions   Complete by: As directed    Patient is instructed to take all prescribed medications as recommended. Report any side effects or adverse reactions to your outpatient psychiatrist. Patient is instructed to abstain from alcohol and illegal drugs while on prescription medications. In the event of worsening symptoms, patient is instructed to call the crisis hotline, 911, or go to the nearest emergency department for evaluation and treatment.     Allergies as of 10/09/2019   No Known Allergies     Medication List    STOP taking these medications   busPIRone 10 MG tablet Commonly known as: BUSPAR   doxycycline 100 MG tablet Commonly known as: VIBRA-TABS   Vitamin D3 10 MCG (400 UNIT) Caps Replaced by: cholecalciferol 10 MCG (400 UNIT) Tabs tablet     TAKE these medications     Indication  cholecalciferol 10 MCG (400 UNIT) Tabs tablet Commonly known as: VITAMIN D3 Take 1 tablet (400 Units total) by mouth daily. Replaces: Vitamin D3 10 MCG (400  UNIT) Caps  Indication: Supplementation   cloNIDine 0.1 MG tablet Commonly known as: CATAPRES Take 1 tablet (0.1 mg total) by mouth 3 (three) times daily. What changed: additional instructions  Indication: High Blood Pressure Disorder   FLUoxetine 20 MG capsule Commonly known as: PROZAC Take 1 capsule (20 mg total) by mouth daily. What changed:   medication strength  how much to take  Indication: Depression   gabapentin 300 MG capsule Commonly known as: NEURONTIN Take 1 capsule (300 mg total) by mouth 3 (three) times daily.  Indication: Abuse or Misuse of Alcohol, Alcohol Withdrawal Syndrome, anxiety   hydrOXYzine 25 MG tablet Commonly known as: ATARAX/VISTARIL Take 1 tablet (25 mg total) by mouth every 6 (six) hours as needed for anxiety. What changed:   medication strength  how much to take  when to take this  Indication: Feeling Anxious   metoprolol tartrate 50 MG tablet Commonly known as:  LOPRESSOR Take 1 tablet (50 mg total) by mouth 2 (two) times daily.  Indication: High Blood Pressure Disorder   thiamine 100 MG tablet Take 1 tablet (100 mg total) by mouth daily.  Indication: Supplementation   traZODone 100 MG tablet Commonly known as: DESYREL Take 1 tablet (100 mg total) by mouth at bedtime as needed for sleep.  Indication: Trouble Sleeping      Follow-up Information    [facility]. Go on 10/10/2019.   Why: Your screening for admission appointment is Wednesday, 10/10/2019 at 7:45am. Please be sure to have your belongings, also be sure to provide your discharge summary including your list of medications.   Contact information: [address] [place] [address] [phone]        [facility] Follow up on 10/11/2019.   Why: Upon completion of residential treatment, for medication management and therapy services, please go to walk-in hours to establish services. Walk-in hours are Monday-Friday from 8:00a-5:00p. Be sure to provide any  discharge paperwork.  Contact information: [address] [place] [address] [phone]           Follow-up recommendations: Activity as tolerated. Diet as recommended by primary care physician. Keep all scheduled follow-up appointments as recommended.   Comments:   Patient is instructed to take all prescribed medications as recommended. Report any side effects or adverse reactions to your outpatient psychiatrist. Patient is instructed to abstain from alcohol and illegal drugs while on prescription medications. In the event of worsening symptoms, patient is instructed to call the crisis hotline, 911, or go to the nearest emergency department for evaluation and treatment.  Signed: Aldean Baker, NP 10/09/2019, 9:21 AM

## 2019-10-09 NOTE — Progress Notes (Signed)
  [facility] Discharge Plan :  Will you be returning to the same living situation after discharge:  Yes,  patient is temporarily returning home with his father, until his admission to [facility] on Wednesday (10/10/19) At discharge, do you have transportation home?: Yes,  patient's car is in [facility] parking lot Do you have the ability to pay for your medications: No.  Release of information consent forms completed and in the chart;  Patient's signature needed at discharge.  Patient to Follow up at: Follow-up Information    [facility]. Go on 10/10/2019.   Why: Your screening for admission appointment is Wednesday, 10/10/2019 at 7:45am. Please be sure to have your belongings, also be sure to provide your discharge summary including your list of medications.   Contact information: [address] [place] [address] [phone]        [facility] Follow up on 10/11/2019.   Why: Upon completion of residential treatment, for medication management and therapy services, please go to walk-in hours to establish services. Walk-in hours are Monday-Friday from 8:00a-5:00p. Be sure to provide any discharge paperwork.  Contact information: [address] [place] [address] [phone]           Next level of care provider has access to [facility] Link:yes  Safety Planning and Suicide Prevention discussed: Yes,  with the patient's father  Have you used any form of tobacco in the last 30 days? (Cigarettes, Smokeless Tobacco, Cigars, and/or Pipes): No  Has patient been referred to the Quitline?: N/A patient is not a smoker  Patient has been referred for addiction treatment: Yes  Maeola Sarah, LCSWA 10/09/2019, 9:34 AM

## 2019-10-09 NOTE — Progress Notes (Signed)
Pt discharged to lobby. Pt was stable and appreciative at that time. All papers, samples and prescriptions were given and valuables returned. Verbal understanding expressed. Denies SI/HI and A/VH. Pt given opportunity to express concerns and ask questions.  

## 2019-10-09 NOTE — Progress Notes (Signed)
DAR NOTE: Pt present with bright  affect and calm mood in the unit. Pt has been visible in the milieu with peers. Pt denies physical pain, took all his meds as scheduled.  Pt's safety ensured with 15 minute and environmental checks. Pt currently denies SI/HI and A/V hallucinations. Pt verbally agrees to seek staff if SI/HI or A/VH occurs and to consult with staff before acting on these thoughts. Will continue POC.

## 2019-10-09 NOTE — Progress Notes (Signed)
Spiritual care group on grief and loss facilitated by chaplain Burnis Kingfisher MDiv, B[facility]  Group Goal:  Support / Education around grief and loss Members engage in facilitated group support and psycho-social education.  Group Description:  Following introductions and group rules, group members engaged in facilitated group dialog and support around topic of loss, with particular support around experiences of loss in their lives. Group Identified types of loss (relationships / self / things) and identified patterns, circumstances, and changes that precipitate losses. Reflected on thoughts / feelings around loss, normalized grief responses, and recognized variety in grief experience.   Group noted Worden's four tasks of grief in discussion.  Group drew on Adlerian / Rogerian, narrative, MI, Patient Progress:  Present throughout group.  Engaged with another group member around cultural expectations in grief as well as experience of "wearing a mask" in order to appease other's expectations.

## 2019-10-09 NOTE — BHH Suicide Risk Assessment (Signed)
[facility] Discharge Suicide Risk Assessment   Principal Problem: MDD (major depressive disorder), recurrent severe, without psychosis ([facility]) Discharge Diagnoses: Principal Problem:   MDD (major depressive disorder), recurrent severe, without psychosis ([facility]) Active Problems:   Polysubstance dependence ([facility])   Total Time spent with patient: 20 minutes  Musculoskeletal: Strength & Muscle Tone: within normal limits Gait & Station: normal Patient leans: N/A  Psychiatric Specialty Exam: Review of Systems  All other systems reviewed and are negative.   Blood pressure (!) 138/93, pulse 75, temperature 97.9 F (36.6 C), temperature source Oral, resp. rate 16, height 6\' 3"  (1.905 m), weight 96.2 kg, SpO2 99 %.Body mass index is 26.5 kg/m.  General Appearance: Casual  Eye Contact::  Fair  Speech:  Normal Rate409  Volume:  Normal  Mood:  Euthymic  Affect:  Congruent  Thought Process:  Coherent and Descriptions of Associations: Intact  Orientation:  Full (Time, Place, and Person)  Thought Content:  Logical  Suicidal Thoughts:  No  Homicidal Thoughts:  No  Memory:  Immediate;   Good Recent;   Good Remote;   Good  Judgement:  Good  Insight:  Fair  Psychomotor Activity:  Normal  Concentration:  Good  Recall:  Good  Fund of Knowledge:Good  Language: Good  Akathisia:  Negative  Handed:  Right  AIMS (if indicated):     Assets:  Desire for Improvement Resilience  Sleep:  Number of Hours: 5.5  Cognition: WNL  ADL's:  Intact   Mental Status Per Nursing Assessment::   On Admission:  NA  Demographic Factors:  Male, Divorced or widowed, Low socioeconomic status and Unemployed  Loss Factors: NA  Historical Factors: Impulsivity  Risk Reduction Factors:   Positive coping skills or problem solving skills  Continued Clinical Symptoms:  Depression:   Comorbid alcohol abuse/dependence Impulsivity Alcohol/Substance Abuse/Dependencies  Cognitive Features That Contribute To Risk:   None    Suicide Risk:  Minimal: No identifiable suicidal ideation.  Patients presenting with no risk factors but with morbid ruminations; may be classified as minimal risk based on the severity of the depressive symptoms  Follow-up Information    [facility]. Go on 10/10/2019.   Why: Your screening for admission appointment is Wednesday, 10/10/2019 at 7:45am. Please be sure to have your belongings, also be sure to provide your discharge summary including your list of medications.   Contact information: [address] [place] [phone]        [facility] Follow up on 10/11/2019.   Why: Upon completion of residential treatment, for medication management and therapy services, please go to walk-in hours to establish services. Walk-in hours are Monday-Friday from 8:00a-5:00p. Be sure to provide any discharge paperwork.  Contact information: [address] [place] [phone]           Plan Of Care/Follow-up recommendations:  Activity:  ad lib  [phone], MD 10/09/2019, 7:28 AM

## 2019-11-02 ENCOUNTER — Telehealth: Payer: Self-pay | Admitting: Internal Medicine

## 2019-11-02 MED ORDER — HYDROXYZINE HCL 25 MG PO TABS: 25.0000 mg | ORAL_TABLET | Freq: Four times a day (QID) | ORAL | 0 refills | Status: DC | PRN

## 2019-11-02 NOTE — Telephone Encounter (Signed)
1) Medication(s) Requested (by name):hydrOXYzine (ATARAX/VISTARIL) 25 MG tablet [[id number]]    2) Pharmacy of Choice: [facility] in [place]   3) Special Requests:   Approved medications will be sent to the pharmacy, we will reach out if there is an issue.  Requests made after 3pm may not be addressed until the following business day!  If a patient is unsure of the name of the medication(s) please note and ask patient to call back when they are able to provide all info, do not send to responsible party until all information is available!\

## 2019-11-21 ENCOUNTER — Telehealth: Payer: Self-pay

## 2019-11-21 NOTE — Telephone Encounter (Signed)

## 2019-11-22 ENCOUNTER — Ambulatory Visit (INDEPENDENT_AMBULATORY_CARE_PROVIDER_SITE_OTHER): Payer: Self-pay | Admitting: Internal Medicine

## 2019-11-22 ENCOUNTER — Encounter: Payer: Self-pay | Admitting: Internal Medicine

## 2019-11-22 ENCOUNTER — Other Ambulatory Visit: Payer: Self-pay

## 2019-11-22 VITALS — BP 130/90 | HR 63 | Temp 97.5°F | Resp 17 | Wt 226.0 lb

## 2019-11-22 DIAGNOSIS — F331 Major depressive disorder, recurrent, moderate: Secondary | ICD-10-CM

## 2019-11-22 DIAGNOSIS — F419 Anxiety disorder, unspecified: Secondary | ICD-10-CM

## 2019-11-22 DIAGNOSIS — Z5989 Other problems related to housing and economic circumstances: Secondary | ICD-10-CM

## 2019-11-22 DIAGNOSIS — Z598 Other problems related to housing and economic circumstances: Secondary | ICD-10-CM

## 2019-11-22 DIAGNOSIS — I10 Essential (primary) hypertension: Secondary | ICD-10-CM

## 2019-11-22 DIAGNOSIS — F1920 Other psychoactive substance dependence, uncomplicated: Secondary | ICD-10-CM

## 2019-11-22 MED ORDER — HYDROXYZINE HCL 25 MG PO TABS: 25.0000 mg | ORAL_TABLET | Freq: Four times a day (QID) | ORAL | 0 refills | Status: DC | PRN

## 2019-11-22 MED ORDER — FLUOXETINE HCL 20 MG PO CAPS: 20.0000 mg | ORAL_CAPSULE | Freq: Every day | ORAL | 2 refills | Status: DC

## 2019-11-22 MED ORDER — CLONIDINE HCL 0.1 MG PO TABS: 0.1000 mg | ORAL_TABLET | Freq: Three times a day (TID) | ORAL | 2 refills | Status: DC

## 2019-11-22 MED ORDER — TRAZODONE HCL 100 MG PO TABS: 100.0000 mg | ORAL_TABLET | Freq: Every evening | ORAL | 2 refills | Status: DC | PRN

## 2019-11-22 MED ORDER — METOPROLOL TARTRATE 50 MG PO TABS: 50.0000 mg | ORAL_TABLET | Freq: Two times a day (BID) | ORAL | 2 refills | Status: DC

## 2019-11-22 MED ORDER — GABAPENTIN 300 MG PO CAPS: 300.0000 mg | ORAL_CAPSULE | Freq: Three times a day (TID) | ORAL | 2 refills | Status: DC

## 2019-11-22 MED FILL — GABAPENTIN 300 MG CAPSULE: 300 | 30 days supply | Qty: 90 | Fill #0

## 2019-11-22 MED FILL — hydrOXYzine HCL 25 MG TABS: 25 | 7 days supply | Qty: 30 | Fill #0

## 2019-11-22 MED FILL — TRAZODONE HCL 100 MG TABS: 100 | 30 days supply | Qty: 30 | Fill #0

## 2019-11-22 MED FILL — METOPROLOL TARTRATE 50 MG T: 50 | 30 days supply | Qty: 60 | Fill #0

## 2019-11-22 MED FILL — cloNIDine HCL 0.1 MG TABS: 0.1 | 30 days supply | Qty: 90 | Fill #0

## 2019-11-22 MED FILL — FLUoxetine HCL 20 MG CAPS: 20 | 30 days supply | Qty: 30 | Fill #0

## 2019-11-22 NOTE — Progress Notes (Signed)
Subjective:    Eric Keller - 34 y.o. male MRN [id number]  Date of birth: 1985-09-10  HPI  Eisa Necaise is here for HTN f/u.  Chronic HTN Disease Monitoring:  Home BP Monitoring - 130/80s, monitoring almost daily  Chest pain- no  Dyspnea- no Headache - no  Medications: Metoprolol 50 mg BID  Compliance- yes Lightheadedness- no  Edema- no   Patient has a history of polysubstance abuse. Was admitted at behavioral health hospital from 4/13-4/20 for polysubstance dependence with SI. During this hospitalization, Buspar was discontinued. After discharge, he completed 30 days of rehab at [facility]. He has been out for about 2 weeks and living with his father. Reports father is supportive of his recovery and this is a stable living environment. Reports mood has been much better. He still has anxiety symptoms but they are manageable. No SI. He has established with [facility] for outpatient therapy but has not had first appointment. Asks about options for medication assistance.   Depression screen [facility] 2/9 11/22/2019 05/11/2018  Decreased Interest 1 0  Down, Depressed, Hopeless 0 0  PHQ - 2 Score 1 0  Altered sleeping 1 -  Tired, decreased energy 1 -  Change in appetite 0 -  Feeling bad or failure about yourself  1 -  Trouble concentrating 0 -  Moving slowly or fidgety/restless 0 -  Suicidal thoughts 0 -  PHQ-9 Score 4 -   GAD 7 : Generalized Anxiety Score 11/22/2019  Nervous, Anxious, on Edge 1  Control/stop worrying 1  Worry too much - different things 1  Trouble relaxing 1  Restless 0  Easily annoyed or irritable 0  Afraid - awful might happen 0  Total GAD 7 Score 4     Health Maintenance:  There are no preventive care reminders to display for this patient.  -  reports that he has never smoked. He has never used smokeless tobacco. - Review of Systems: Per HPI. 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/02/2019   Polysubstance dependence including opioid type drug with complication, continuous use (HCC) 07/22/2019   Benzodiazepine withdrawal without complication (HCC) 07/22/2019   Alcohol dependence with uncomplicated withdrawal (HCC) 07/19/2019   LFT elevation    Essential hypertension, benign 05/11/2018   Flushing 05/11/2018   Nonintractable headache 05/11/2018   Erythrocytosis 05/11/2018   Anxiety 04/14/2011   Depression 04/14/2011   - Medications: reviewed and updated   Objective:   Physical Exam BP 130/90    Pulse 63    Temp (!) 97.5 F (36.4 C) (Temporal)    Resp 17    Wt 226 lb (102.5 kg)    SpO2 97%    BMI 28.25 kg/m  Physical Exam  Constitutional: He is oriented to person, place, and time and well-developed, well-nourished, and in no distress. No distress.  Cardiovascular: Normal rate.  Pulmonary/Chest: Effort normal. No respiratory distress.  Musculoskeletal:        General: Normal range of motion.  Neurological: He is alert and oriented to person, place, and time.  Skin: Skin is warm and dry. He is not diaphoretic.  Psychiatric: Affect and judgment normal.           Assessment & Plan:   1. Essential hypertension BP well controlled. Continue current regimen.  Counseled on blood pressure goal of less than 130/80, low-sodium, DASH diet, medication compliance,  150 minutes of moderate intensity exercise per week. Discussed medication compliance, adverse effects. - metoprolol tartrate (LOPRESSOR) 50 MG tablet; Take 1 tablet (50 mg total) by mouth 2 (two) times daily.  Dispense: 60 tablet; Refill: 2  2. Polysubstance dependence including opioid type drug with complication, continuous use ([place]) Continue outpatient therapy. Will not adjust medication regimen at present given recent discharge from rehabilitation facility.  - traZODone (DESYREL) 100 MG  tablet; Take 1 tablet (100 mg total) by mouth at bedtime as needed for sleep.  Dispense: 30 tablet; Refill: 2 - gabapentin (NEURONTIN) 300 MG capsule; Take 1 capsule (300 mg total) by mouth 3 (three) times daily.  Dispense: 90 capsule; Refill: 2 - cloNIDine (CATAPRES) 0.1 MG tablet; Take 1 tablet (0.1 mg total) by mouth 3 (three) times daily.  Dispense: 90 tablet; Refill: 2 - hydrOXYzine (ATARAX/VISTARIL) 25 MG tablet; Take 1 tablet (25 mg total) by mouth every 6 (six) hours as needed for anxiety.  Dispense: 30 tablet; Refill: 0  3. Moderate episode of recurrent major depressive disorder ([place])  4. Anxiety PHQ-9 and GAD-7 scores show that symptoms well controlled. Continue current regimen. Eventually, patient wants to get off Prozac and some of his other medications.  - FLUoxetine (PROZAC) 20 MG capsule; Take 1 capsule (20 mg total) by mouth daily.  Dispense: 30 capsule; Refill: 2 - hydrOXYzine (ATARAX/VISTARIL) 25 MG tablet; Take 1 tablet (25 mg total) by mouth every 6 (six) hours as needed for anxiety.  Dispense: 30 tablet; Refill: 0  5. Does not have health insurance Advised to send Rxs to [facility] pharmacy due to low out of pocket costs for uninsured patient and patient is agreeable. Will try to set up for [facility] financial assistance as well.      Phill Myron, D.O. 11/22/2019, 2:22 PM Primary Care at [facility]

## 2019-12-31 ENCOUNTER — Other Ambulatory Visit: Payer: Self-pay | Admitting: Internal Medicine

## 2019-12-31 MED FILL — METOPROLOL TARTRATE 50 MG T: 50 | 30 days supply | Qty: 60 | Fill #1

## 2019-12-31 MED FILL — GABAPENTIN 300 MG CAPSULE: 300 | 30 days supply | Qty: 90 | Fill #1

## 2019-12-31 MED FILL — FLUoxetine HCL 20 MG CAPS: 20 | 30 days supply | Qty: 30 | Fill #1

## 2019-12-31 MED FILL — cloNIDine HCL 0.1 MG TABS: 0.1 | 30 days supply | Qty: 90 | Fill #1

## 2019-12-31 MED FILL — TRAZODONE HCL 100 MG TABS: 100 | 30 days supply | Qty: 30 | Fill #1

## 2019-12-31 NOTE — Telephone Encounter (Signed)
See request for multiple medication refills; sent to wrong refill queue.

## 2019-12-31 NOTE — Telephone Encounter (Signed)
Medication: metoprolol tartrate (LOPRESSOR) 50 MG tablet [[id number]] , cloNIDine (CATAPRES) 0.1 MG tablet [[id number]] , FLUoxetine (PROZAC) 20 MG capsule , gabapentin (NEURONTIN) 300 MG capsule [[id number]] [[id number]] , traZODone (DESYREL) 100 MG tablet [[id number]]   Has the patient contacted their pharmacy? YES (Agent: If no, request that the patient contact the pharmacy for the refill.) (Agent: If yes, when and what did the pharmacy advise?)  Preferred Pharmacy (with phone number or street name): [facility] [place], [place] - [address]  Phone:  [phone] Fax:  [phone]     Agent: Please be advised that RX refills may take up to 3 business days. We ask that you follow-up with your pharmacy.

## 2019-12-31 NOTE — Telephone Encounter (Signed)
Rxs are ready for pick-up at our pharmacy. Pt called w/ no answer. Left HIPAA-compliant VM informing him that these are ready for pick-up.

## 2020-01-09 MED FILL — METOPROLOL TARTRATE 50 MG T: 50 | 30 days supply | Qty: 60 | Fill #1

## 2020-01-09 MED FILL — cloNIDine HCL 0.1 MG TABS: 0.1 | 30 days supply | Qty: 90 | Fill #1

## 2020-01-09 MED FILL — TRAZODONE HCL 100 MG TABS: 100 | 30 days supply | Qty: 30 | Fill #1

## 2020-01-16 ENCOUNTER — Other Ambulatory Visit: Payer: Self-pay

## 2020-01-16 DIAGNOSIS — F1920 Other psychoactive substance dependence, uncomplicated: Secondary | ICD-10-CM

## 2020-01-16 DIAGNOSIS — F419 Anxiety disorder, unspecified: Secondary | ICD-10-CM

## 2020-01-17 MED ORDER — HYDROXYZINE HCL 25 MG PO TABS: 25.0000 mg | ORAL_TABLET | Freq: Four times a day (QID) | ORAL | 2 refills | Status: DC | PRN

## 2020-01-30 MED FILL — GABAPENTIN 300 MG CAPSULE: 300 | 30 days supply | Qty: 90 | Fill #2

## 2020-01-30 MED FILL — FLUoxetine HCL 20 MG CAPS: 20 | 30 days supply | Qty: 30 | Fill #2

## 2020-02-13 MED FILL — cloNIDine HCL 0.1 MG TABS: 0.1 | 30 days supply | Qty: 90 | Fill #2

## 2020-02-13 MED FILL — TRAZODONE HCL 100 MG TABS: 100 | 30 days supply | Qty: 30 | Fill #2

## 2020-02-13 MED FILL — METOPROLOL TARTRATE 50 MG T: 50 | 30 days supply | Qty: 60 | Fill #2

## 2020-02-21 ENCOUNTER — Ambulatory Visit: Payer: Self-pay | Admitting: Internal Medicine

## 2020-02-21 ENCOUNTER — Other Ambulatory Visit: Payer: Self-pay | Admitting: Internal Medicine

## 2020-02-21 ENCOUNTER — Telehealth (INDEPENDENT_AMBULATORY_CARE_PROVIDER_SITE_OTHER): Payer: Self-pay | Admitting: Internal Medicine

## 2020-02-21 DIAGNOSIS — F419 Anxiety disorder, unspecified: Secondary | ICD-10-CM

## 2020-02-21 DIAGNOSIS — F1920 Other psychoactive substance dependence, uncomplicated: Secondary | ICD-10-CM

## 2020-02-21 DIAGNOSIS — I10 Essential (primary) hypertension: Secondary | ICD-10-CM

## 2020-02-21 DIAGNOSIS — F331 Major depressive disorder, recurrent, moderate: Secondary | ICD-10-CM

## 2020-02-21 MED ORDER — TRAZODONE HCL 100 MG PO TABS: 100.0000 mg | ORAL_TABLET | Freq: Every evening | ORAL | 2 refills | Status: DC | PRN

## 2020-02-21 MED ORDER — GABAPENTIN 300 MG PO CAPS: 300.0000 mg | ORAL_CAPSULE | Freq: Three times a day (TID) | ORAL | 2 refills | Status: DC

## 2020-02-21 MED ORDER — METOPROLOL TARTRATE 50 MG PO TABS: 50.0000 mg | ORAL_TABLET | Freq: Two times a day (BID) | ORAL | 2 refills | Status: DC

## 2020-02-21 MED ORDER — FLUOXETINE HCL 20 MG PO CAPS: 20.0000 mg | ORAL_CAPSULE | Freq: Every day | ORAL | 2 refills | Status: DC

## 2020-02-21 MED ORDER — CLONIDINE HCL 0.1 MG PO TABS: 0.1000 mg | ORAL_TABLET | Freq: Three times a day (TID) | ORAL | 2 refills | Status: DC

## 2020-02-21 NOTE — Progress Notes (Signed)
Virtual Visit via Telephone Note  I connected with Eric Keller, on 02/21/2020 at 8:48 AM by telephone due to the COVID-19 pandemic and verified that I am speaking with the correct person using two identifiers.   Consent: I discussed the limitations, risks, security and privacy concerns of performing an evaluation and management service by telephone and the availability of in person appointments. I also discussed with the patient that there may be a patient responsible charge related to this service. The patient expressed understanding and agreed to proceed.   Location of Patient: Home  Location of Provider: Clinic    Persons participating in Telemedicine visit: Alpha Mysliwiec [facility] Dr. Earlene Plater      History of Present Illness: Patient has a visit to follow up on HTN.   Chronic HTN Disease Monitoring:  Home BP Monitoring - 130/80s---reports has been running that way a little higher due to stress  Chest pain- no  Dyspnea- no Headache - no  Medications: Metoprolol 50 mg BID  Compliance- yes Lightheadedness- no  Edema- no   Patient has a history of polysubstance abuse. Was admitted at behavioral health hospital from 4/13-4/20 for polysubstance dependence with SI. During this hospitalization, Buspar was discontinued. After discharge, he completed 30 days of rehab at [facility]. He has been living with his father. He still has anxiety symptoms but they are manageable. No SI. He has established with [facility] for outpatient therapy and group therapy 2x per week. Worried about stopping any of his medications and having rebound anxiety. Does not need refill of Hydroxyzine as he ran out and didn't feel like he still needed it. Was using very infrequently.     Past Medical History:  Diagnosis Date  . Alcohol withdrawal (HCC) 07/20/2019  . Anxiety   . Anxiety   . Depression   . H/O: substance abuse (HCC)   . Hypertension    No Known  Allergies  Current Outpatient Medications on File Prior to Visit  Medication Sig Dispense Refill  . cloNIDine (CATAPRES) 0.1 MG tablet Take 1 tablet (0.1 mg total) by mouth 3 (three) times daily. 90 tablet 2  . FLUoxetine (PROZAC) 20 MG capsule Take 1 capsule (20 mg total) by mouth daily. 30 capsule 2  . gabapentin (NEURONTIN) 300 MG capsule Take 1 capsule (300 mg total) by mouth 3 (three) times daily. 90 capsule 2  . hydrOXYzine (ATARAX/VISTARIL) 25 MG tablet Take 1 tablet (25 mg total) by mouth every 6 (six) hours as needed for anxiety. 30 tablet 2  . metoprolol tartrate (LOPRESSOR) 50 MG tablet Take 1 tablet (50 mg total) by mouth 2 (two) times daily. 60 tablet 2  . traZODone (DESYREL) 100 MG tablet Take 1 tablet (100 mg total) by mouth at bedtime as needed for sleep. 30 tablet 2   No current facility-administered medications on file prior to visit.    Observations/Objective: NAD. Speaking clearly.  Work of breathing normal.  Alert and oriented. Mood appropriate.   Assessment and Plan: 1. Essential hypertension BP around goal. Asymptomatic. Continue Metoprolol. Monitor in 3 months.  - metoprolol tartrate (LOPRESSOR) 50 MG tablet; Take 1 tablet (50 mg total) by mouth 2 (two) times daily.  Dispense: 60 tablet; Refill: 2  2. Polysubstance dependence including opioid type drug with complication, continuous use (HCC) Has not used since discharge. Discussed option of weaning Clonidine since was initially prescribed for withdrawal symptoms. Patient anxious about changing medication regimen. Given could provide dual benefit for HTN, will not change at present.  -  cloNIDine (CATAPRES) 0.1 MG tablet; Take 1 tablet (0.1 mg total) by mouth 3 (three) times daily.  Dispense: 90 tablet; Refill: 2 - gabapentin (NEURONTIN) 300 MG capsule; Take 1 capsule (300 mg total) by mouth 3 (three) times daily.  Dispense: 90 capsule; Refill: 2 - traZODone (DESYREL) 100 MG tablet; Take 1 tablet (100 mg total) by  mouth at bedtime as needed for sleep.  Dispense: 30 tablet; Refill: 2  3. Moderate episode of recurrent major depressive disorder (HCC) - FLUoxetine (PROZAC) 20 MG capsule; Take 1 capsule (20 mg total) by mouth daily.  Dispense: 30 capsule; Refill: 2  4. Anxiety - FLUoxetine (PROZAC) 20 MG capsule; Take 1 capsule (20 mg total) by mouth daily.  Dispense: 30 capsule; Refill: 2   Follow Up Instructions: 3 month f/u    I discussed the assessment and treatment plan with the patient. The patient was provided an opportunity to ask questions and all were answered. The patient agreed with the plan and demonstrated an understanding of the instructions.   The patient was advised to call back or seek an in-person evaluation if the symptoms worsen or if the condition fails to improve as anticipated.     I provided 22 minutes total of non-face-to-face time during this encounter including median intraservice time, reviewing previous notes, investigations, ordering medications, medical decision making, coordinating care and patient verbalized understanding at the end of the visit.    [name], D.O. Primary Care at [facility]  02/21/2020, 8:48 AM

## 2020-02-29 MED FILL — GABAPENTIN 300 MG CAPSULE: 300 | 30 days supply | Qty: 90 | Fill #0

## 2020-02-29 MED FILL — FLUoxetine HCL 20 MG CAPS: 20 | 30 days supply | Qty: 30 | Fill #0

## 2020-03-11 MED FILL — METOPROLOL TARTRATE 50 MG T: 50 | 30 days supply | Qty: 60 | Fill #0

## 2020-03-11 MED FILL — cloNIDine HCL 0.1 MG TABS: 0.1 | 30 days supply | Qty: 90 | Fill #0

## 2020-03-11 MED FILL — TRAZODONE HCL 100 MG TABS: 100 | 30 days supply | Qty: 30 | Fill #0

## 2020-03-31 MED FILL — FLUoxetine HCL 20 MG CAPS: 20 | 30 days supply | Qty: 30 | Fill #1

## 2020-03-31 MED FILL — GABAPENTIN 300 MG CAPSULE: 300 | 30 days supply | Qty: 90 | Fill #1

## 2020-04-11 MED FILL — METOPROLOL TARTRATE 50 MG T: 50 | 30 days supply | Qty: 60 | Fill #1

## 2020-04-11 MED FILL — cloNIDine HCL 0.1 MG TABS: 0.1 | 30 days supply | Qty: 90 | Fill #1

## 2020-04-28 MED FILL — FLUoxetine HCL 20 MG CAPS: 20 | 30 days supply | Qty: 30 | Fill #2

## 2020-04-28 MED FILL — GABAPENTIN 300 MG CAPSULE: 300 | 30 days supply | Qty: 90 | Fill #2

## 2020-05-07 MED FILL — METOPROLOL TARTRATE 50 MG T: 50 | 30 days supply | Qty: 60 | Fill #2

## 2020-05-07 MED FILL — cloNIDine HCL 0.1 MG TABS: 0.1 | 30 days supply | Qty: 90 | Fill #2

## 2020-05-07 MED FILL — TRAZODONE HCL 100 MG TABS: 100 | 30 days supply | Qty: 30 | Fill #1

## 2020-05-27 ENCOUNTER — Other Ambulatory Visit: Payer: Self-pay | Admitting: Internal Medicine

## 2020-05-27 ENCOUNTER — Telehealth (INDEPENDENT_AMBULATORY_CARE_PROVIDER_SITE_OTHER): Payer: Self-pay | Admitting: Internal Medicine

## 2020-05-27 DIAGNOSIS — F331 Major depressive disorder, recurrent, moderate: Secondary | ICD-10-CM

## 2020-05-27 DIAGNOSIS — F1920 Other psychoactive substance dependence, uncomplicated: Secondary | ICD-10-CM

## 2020-05-27 DIAGNOSIS — I10 Essential (primary) hypertension: Secondary | ICD-10-CM

## 2020-05-27 DIAGNOSIS — F419 Anxiety disorder, unspecified: Secondary | ICD-10-CM

## 2020-05-27 MED ORDER — CLONIDINE HCL 0.1 MG PO TABS: 0.1000 mg | ORAL_TABLET | Freq: Three times a day (TID) | ORAL | 2 refills | Status: DC

## 2020-05-27 MED ORDER — METOPROLOL TARTRATE 50 MG PO TABS: 50.0000 mg | ORAL_TABLET | Freq: Two times a day (BID) | ORAL | 2 refills | Status: DC

## 2020-05-27 MED ORDER — TRAZODONE HCL 100 MG PO TABS: 100.0000 mg | ORAL_TABLET | Freq: Every evening | ORAL | 2 refills | Status: DC | PRN

## 2020-05-27 MED ORDER — FLUOXETINE HCL 40 MG PO CAPS: 40.0000 mg | ORAL_CAPSULE | Freq: Every day | ORAL | 0 refills | Status: DC

## 2020-05-27 MED ORDER — GABAPENTIN 300 MG PO CAPS: 300.0000 mg | ORAL_CAPSULE | Freq: Three times a day (TID) | ORAL | 2 refills | Status: DC

## 2020-05-27 MED FILL — GABAPENTIN 300 MG CAPSULE: 300 | 30 days supply | Qty: 90 | Fill #0

## 2020-05-27 MED FILL — FLUoxetine HCL 40 MG CAPS: 40 | 30 days supply | Qty: 30 | Fill #0

## 2020-05-27 NOTE — Progress Notes (Signed)
Virtual Visit via Telephone Note  I connected with Eric Keller, on 05/27/2020 at 10:12 AM by telephone due to the COVID-19 pandemic and verified that I am speaking with the correct person using two identifiers.   Consent: I discussed the limitations, risks, security and privacy concerns of performing an evaluation and management service by telephone and the availability of in person appointments. I also discussed with the patient that there may be a patient responsible charge related to this service. The patient expressed understanding and agreed to proceed.   Location of Patient: Home   Location of Provider: Clinic    Persons participating in Telemedicine visit: Alexx Mcburney [facility] Dr. Earlene Plater      History of Present Illness: Patient has a visit to follow up for medication refills. Has been out of Prozac for a few days. Has concerns about having "horrible" anxiety and depression over the past few weeks. Feels ok today.   Reports monitors his BP once or twice per day. Highest numbers have been 130/90s and typically 120/70-80s. Compliant with medications.   Depression screen [facility] 2/9 05/27/2020 11/22/2019 05/11/2018  Decreased Interest 2 1 0  Down, Depressed, Hopeless 2 0 0  PHQ - 2 Score 4 1 0  Altered sleeping 1 1 -  Tired, decreased energy 2 1 -  Change in appetite 0 0 -  Feeling bad or failure about yourself  1 1 -  Trouble concentrating 1 0 -  Moving slowly or fidgety/restless 1 0 -  Suicidal thoughts 0 0 -  PHQ-9 Score 10 4 -   GAD 7 : Generalized Anxiety Score 05/27/2020 11/22/2019  Nervous, Anxious, on Edge 2 1  Control/stop worrying 2 1  Worry too much - different things 2 1  Trouble relaxing 2 1  Restless 2 0  Easily annoyed or irritable 2 0  Afraid - awful might happen 2 0  Total GAD 7 Score 14 4  Anxiety Difficulty Somewhat difficult -      Past Medical History:  Diagnosis Date  . Alcohol withdrawal (HCC) 07/20/2019  . Anxiety   . Anxiety    . Depression   . H/O: substance abuse (HCC)   . Hypertension    No Known Allergies  Current Outpatient Medications on File Prior to Visit  Medication Sig Dispense Refill  . cloNIDine (CATAPRES) 0.1 MG tablet Take 1 tablet (0.1 mg total) by mouth 3 (three) times daily. 90 tablet 2  . FLUoxetine (PROZAC) 20 MG capsule Take 1 capsule (20 mg total) by mouth daily. 30 capsule 2  . gabapentin (NEURONTIN) 300 MG capsule Take 1 capsule (300 mg total) by mouth 3 (three) times daily. 90 capsule 2  . metoprolol tartrate (LOPRESSOR) 50 MG tablet Take 1 tablet (50 mg total) by mouth 2 (two) times daily. 60 tablet 2  . traZODone (DESYREL) 100 MG tablet Take 1 tablet (100 mg total) by mouth at bedtime as needed for sleep. 30 tablet 2   No current facility-administered medications on file prior to visit.    Observations/Objective: NAD. Speaking clearly.  Work of breathing normal.  Alert and oriented. Mood appropriate.   Assessment and Plan: 1. Polysubstance dependence including opioid type drug with complication, continuous use (HCC) - cloNIDine (CATAPRES) 0.1 MG tablet; Take 1 tablet (0.1 mg total) by mouth 3 (three) times daily.  Dispense: 90 tablet; Refill: 2 - gabapentin (NEURONTIN) 300 MG capsule; Take 1 capsule (300 mg total) by mouth 3 (three) times daily.  Dispense: 90 capsule; Refill:  2 - traZODone (DESYREL) 100 MG tablet; Take 1 tablet (100 mg total) by mouth at bedtime as needed for sleep.  Dispense: 30 tablet; Refill: 2  2. Moderate episode of recurrent major depressive disorder (HCC) PHQ-9 score concerning for moderate MDD, worsened since last visit. Will increase Prozac dose. Discussed potential side effects. Referral to counseling made. Appointment scheduled with Jenel Lucks, LCSW in the interim.  - FLUoxetine (PROZAC) 40 MG capsule; Take 1 capsule (40 mg total) by mouth daily.  Dispense: 90 capsule; Refill: 0 - Ambulatory referral to Psychology  3. Anxiety GAD-7 score concerning  for moderate-severe anxiety, worsened since last visit. Management as above.  - FLUoxetine (PROZAC) 40 MG capsule; Take 1 capsule (40 mg total) by mouth daily.  Dispense: 90 capsule; Refill: 0 - Ambulatory referral to Psychology  4. Essential hypertension BP at goal. Continue current regimen.  - metoprolol tartrate (LOPRESSOR) 50 MG tablet; Take 1 tablet (50 mg total) by mouth 2 (two) times daily.  Dispense: 60 tablet; Refill: 2   Follow Up Instructions: 4-6 week f/u anxiety   I discussed the assessment and treatment plan with the patient. The patient was provided an opportunity to ask questions and all were answered. The patient agreed with the plan and demonstrated an understanding of the instructions.   The patient was advised to call back or seek an in-person evaluation if the symptoms worsen or if the condition fails to improve as anticipated.     I provided 12 minutes total of non-face-to-face time during this encounter including median intraservice time, reviewing previous notes, investigations, ordering medications, medical decision making, coordinating care and patient verbalized understanding at the end of the visit.    [name], D.O. Primary Care at [facility]  05/27/2020, 10:12 AM

## 2020-06-04 MED FILL — METOPROLOL TARTRATE 50 MG T: 50 | 30 days supply | Qty: 60 | Fill #0

## 2020-06-04 MED FILL — cloNIDine HCL 0.1 MG TABS: 0.1 | 30 days supply | Qty: 90 | Fill #0

## 2020-06-04 MED FILL — TRAZODONE HCL 100 MG TABS: 100 | 30 days supply | Qty: 30 | Fill #0

## 2020-06-10 ENCOUNTER — Ambulatory Visit: Payer: Self-pay | Admitting: Licensed Clinical Social Worker

## 2020-06-12 MED FILL — cloNIDine HCL 0.1 MG TABS: 0.1 | 30 days supply | Qty: 90 | Fill #0

## 2020-06-13 ENCOUNTER — Emergency Department (HOSPITAL_COMMUNITY)
Admission: EM | Admit: 2020-06-13 | Discharge: 2020-06-13 | Disposition: A | Discharge status: Home / Self Care | Payer: Self-pay | Attending: Emergency Medicine | Admitting: Emergency Medicine

## 2020-06-13 ENCOUNTER — Other Ambulatory Visit: Payer: Self-pay

## 2020-06-13 ENCOUNTER — Encounter (HOSPITAL_COMMUNITY): Payer: Self-pay | Admitting: Emergency Medicine

## 2020-06-13 ENCOUNTER — Emergency Department (HOSPITAL_COMMUNITY): Payer: Self-pay

## 2020-06-13 DIAGNOSIS — I10 Essential (primary) hypertension: Secondary | ICD-10-CM | POA: Insufficient documentation

## 2020-06-13 DIAGNOSIS — Z79899 Other long term (current) drug therapy: Secondary | ICD-10-CM | POA: Insufficient documentation

## 2020-06-13 DIAGNOSIS — R110 Nausea: Secondary | ICD-10-CM | POA: Insufficient documentation

## 2020-06-13 DIAGNOSIS — R0789 Other chest pain: Secondary | ICD-10-CM

## 2020-06-13 DIAGNOSIS — R10816 Epigastric abdominal tenderness: Secondary | ICD-10-CM | POA: Insufficient documentation

## 2020-06-13 LAB — HEPATIC FUNCTION PANEL
ALT: 32 U/L (ref 0–44)
AST: 27 U/L (ref 15–41)
Albumin: 4.4 g/dL (ref 3.5–5.0)
Alkaline Phosphatase: 51 U/L (ref 38–126)
Bilirubin, Direct: 0.2 mg/dL (ref 0.0–0.2)
Indirect Bilirubin: 0.7 mg/dL (ref 0.3–0.9)
Total Bilirubin: 0.9 mg/dL (ref 0.3–1.2)
Total Protein: 7.5 g/dL (ref 6.5–8.1)

## 2020-06-13 LAB — BASIC METABOLIC PANEL
Anion gap: 11 (ref 5–15)
BUN: 8 mg/dL (ref 6–20)
CO2: 28 mmol/L (ref 22–32)
Calcium: 10.1 mg/dL (ref 8.9–10.3)
Chloride: 102 mmol/L (ref 98–111)
Creatinine, Ser: 1.28 mg/dL — ABNORMAL HIGH (ref 0.61–1.24)
GFR, Estimated: >60 mL/min (ref >60)
Glucose, Bld: 121 mg/dL — ABNORMAL HIGH (ref 70–99)
Potassium: 4.7 mmol/L (ref 3.5–5.1)
Sodium: 141 mmol/L (ref 135–145)

## 2020-06-13 LAB — CBC
HCT: 58.5 % — ABNORMAL HIGH (ref 39.0–52.0)
Hemoglobin: 19.3 g/dL — ABNORMAL HIGH (ref 13.0–17.0)
MCH: 31.6 pg (ref 26.0–34.0)
MCHC: 33 g/dL (ref 30.0–36.0)
MCV: 95.7 fL (ref 80.0–100.0)
Platelets: 291 10*3/uL (ref 150–400)
RBC: 6.11 MIL/uL — ABNORMAL HIGH (ref 4.22–5.81)
RDW: 14.1 % (ref 11.5–15.5)
WBC: 8.8 10*3/uL (ref 4.0–10.5)
nRBC: 0 % (ref 0.0–0.2)

## 2020-06-13 LAB — LIPASE, BLOOD: Lipase: 29 U/L (ref 11–51)

## 2020-06-13 LAB — TROPONIN I (HIGH SENSITIVITY): Troponin I (High Sensitivity): 7 ng/L (ref <18)

## 2020-06-13 MED ORDER — METOCLOPRAMIDE HCL 5 MG/ML IJ SOLN
10.0000 mg | Freq: Once | INTRAMUSCULAR | Status: AC
  Administered 2020-06-13: 10 mg via INTRAVENOUS
  Filled 2020-06-13: qty 2

## 2020-06-13 NOTE — ED Triage Notes (Addendum)
Patient arrives to ED with c/o hypertension, chest pressure, and headache x36 hours. Pt explains that his BP's have been over 200's systolic and he has not been able to control it at home even after doubling his BP meds. Pt states hes had dull chest pressure as well as a sharp headache that is constant. State seye shave recently been "blood shot."

## 2020-06-13 NOTE — ED Notes (Signed)
DC instructions reviewed with pt. PT verbalized understanding. PT DC °

## 2020-06-13 NOTE — ED Provider Notes (Signed)
[facility] EMERGENCY DEPARTMENT Provider Note   CSN: [id number] Arrival date & time: 06/13/20  1106     History Chief Complaint  Patient presents with  . Hypertension  . Chest Pain    Eric Keller is a 34 y.o. male.  The history is provided by the patient and medical records. No language interpreter was used.  Hypertension Associated symptoms include chest pain.  Chest Pain  Eric Keller is a 34 y.o. male who presents to the Emergency Department complaining of hypertension, headache and chest pain. He presents the emergency department complaining of elevated blood pressures for the last 48 hours. He began feeling unwell on Wednesday evening. He reports left-sided chest tightness that is waxing and waning in nature. He has associated global, throbbing headache that was gradual in onset. He routinely checks his blood pressure at home and has noticed over the last two days that is been elevated to 200/100. He has been compliant with his home medications. Today he took twice of assist home medications due to his elevated blood pressure and presents for evaluation. This morning he took 100 mg of metoprolol and .2 mg of clonidine. No recent missed medications. He denies any recent illnesses. He denies any fevers. He does have mild shortness of breath. No cough, vomiting, diarrhea, abdominal pain. He does have mild nausea. No known sick contacts. No known carbon monoxide exposures. He has been fully vaccinated for COVID-19.    Past Medical History:  Diagnosis Date  . Alcohol withdrawal (HCC) 07/20/2019  . Anxiety   . Anxiety   . Depression   . H/O: substance abuse (HCC)   . Hypertension     Patient Active Problem List   Diagnosis Date Noted  . Polysubstance dependence (HCC) 10/03/2019  . MDD (major depressive disorder), recurrent severe, without psychosis (HCC) 10/02/2019  . Polysubstance dependence including opioid type drug with complication, continuous use  (HCC) 07/22/2019  . Benzodiazepine withdrawal without complication (HCC) 07/22/2019  . Alcohol dependence with uncomplicated withdrawal (HCC) 07/19/2019  . LFT elevation   . Essential hypertension, benign 05/11/2018  . Flushing 05/11/2018  . Nonintractable headache 05/11/2018  . Erythrocytosis 05/11/2018  . Anxiety 04/14/2011  . Depression 04/14/2011    Past Surgical History:  Procedure Laterality Date  . WISDOM TOOTH EXTRACTION         Family History  Problem Relation Age of Onset  . Cancer Mother   . Hypertension Paternal Grandfather   . Heart disease Paternal Grandfather        MIs  . Heart disease Other   . Stroke Other     Social History   Tobacco Use  . Smoking status: Never Smoker  . Smokeless tobacco: Never Used  Vaping Use  . Vaping Use: Never used  Substance Use Topics  . Alcohol use: Yes    Comment: daily 5th of liquor  . Drug use: Not Currently    Types: Marijuana    Comment: Vicodin Abuse, BEnzos    Home Medications Prior to Admission medications   Medication Sig Start Date End Date Taking? Authorizing Provider  acetaminophen (TYLENOL) 500 MG tablet Take 500 mg by mouth every 8 (eight) hours as needed for headache.   Yes [provider]  Cholecalciferol (VITAMIN D-3) 125 MCG (5000 UT) TABS Take 5,000 Units by mouth daily.   Yes [provider]  cloNIDine (CATAPRES) 0.1 MG tablet Take 1 tablet (0.1 mg total) by mouth 3 (three) times daily. 05/27/20  Yes [name]  Lauren, DO  diphenhydrAMINE (BENADRYL) 25 MG tablet Take 25 mg by mouth at bedtime as needed for allergies.   Yes [provider]  FLUoxetine (PROZAC) 40 MG capsule Take 1 capsule (40 mg total) by mouth daily. 05/27/20  Yes Arvilla Market, DO  gabapentin (NEURONTIN) 300 MG capsule Take 1 capsule (300 mg total) by mouth 3 (three) times daily. Patient taking differently: Take 300 mg by mouth 2 (two) times daily. 05/27/20  Yes Arvilla Market,  DO  Magnesium 250 MG TABS Take 250 mg by mouth 2 (two) times daily.   Yes [provider]  metoprolol tartrate (LOPRESSOR) 50 MG tablet Take 1 tablet (50 mg total) by mouth 2 (two) times daily. 05/27/20  Yes Arvilla Market, DO  traZODone (DESYREL) 100 MG tablet Take 1 tablet (100 mg total) by mouth at bedtime as needed for sleep. 05/27/20  Yes Arvilla Market, DO    Allergies    Patient has no known allergies.  Review of Systems   Review of Systems  Cardiovascular: Positive for chest pain.  All other systems reviewed and are negative.   Physical Exam Updated Vital Signs BP 139/83   Pulse 63   Temp 98.3 F (36.8 C) (Oral)   Resp 10   Ht 6\' 2"  (1.88 m)   Wt 99.8 kg   SpO2 95%   BMI 28.25 kg/m   Physical Exam Vitals and nursing note reviewed.  Constitutional:      Appearance: He is well-developed and well-nourished.  HENT:     Head: Normocephalic and atraumatic.  Cardiovascular:     Rate and Rhythm: Normal rate and regular rhythm.     Heart sounds: No murmur heard.   Pulmonary:     Effort: Pulmonary effort is normal. No respiratory distress.     Breath sounds: Normal breath sounds.  Abdominal:     Palpations: Abdomen is soft.     Tenderness: There is no guarding or rebound.     Comments: Mild epigastric tenderness  Musculoskeletal:        General: No swelling, tenderness or edema.  Skin:    General: Skin is warm and dry.  Neurological:     Mental Status: He is alert and oriented to person, place, and time.     Comments: No asymmetry of facial movements. Five out of five strength in all four extremities.  Psychiatric:        Mood and Affect: Mood and affect normal.        Behavior: Behavior normal.     ED Results / Procedures / Treatments   Labs (all labs ordered are listed, but only abnormal results are displayed) Labs Reviewed  BASIC METABOLIC PANEL - Abnormal; Notable for the following components:      Result Value   Glucose,  Bld 121 (*)    Creatinine, Ser 1.28 (*)    All other components within normal limits  CBC - Abnormal; Notable for the following components:   RBC 6.11 (*)    Hemoglobin 19.3 (*)    HCT 58.5 (*)    All other components within normal limits  HEPATIC FUNCTION PANEL  LIPASE, BLOOD  TROPONIN I (HIGH SENSITIVITY)  TROPONIN I (HIGH SENSITIVITY)    EKG EKG Interpretation  Date/Time:  Friday June 13 2020 11:04:40 EST Ventricular Rate:  78 PR Interval:  146 QRS Duration: 106 QT Interval:  384 QTC Calculation: 437 R Axis:   71 Text Interpretation: Normal sinus rhythm Minimal voltage  criteria for LVH, may be normal variant ( Sokolow-Lyon ) Nonspecific T wave abnormality Abnormal ECG Confirmed by Tilden Fossa [id number]) on 06/13/2020 11:41:11 AM   Radiology DG Chest 2 View  Result Date: 06/13/2020 CLINICAL DATA:  Chest pain, shortness of breath and high blood pressure for 2 days EXAM: CHEST - 2 VIEW COMPARISON:  10/01/2019 FINDINGS: Normal heart size, mediastinal contours, and pulmonary vascularity. Mild chronic bronchitic changes. Lungs otherwise clear. No pleural effusion or pneumothorax. Bones unremarkable. IMPRESSION: Mild chronic bronchitic changes without infiltrate. Electronically Signed   By: Ulyses Southward M.D.   On: 06/13/2020 13:27    Procedures Procedures (including critical care time)  Medications Ordered in ED Medications  metoCLOPramide (REGLAN) injection 10 mg (10 mg Intravenous Given 06/13/20 1243)    ED Course  I have reviewed the triage vital signs and the nursing notes.  Pertinent labs & imaging results that were available during my care of the patient were reviewed by me and considered in my medical decision making (see chart for details).    MDM Rules/Calculators/A&P                         Pt with hx/o htn here for evaluation of elevated blood pressure, chest discomfort and HA for two days. He is non-toxic appearing on evaluation. EKG without acute  ischemic changes, no focal neurologic deficits. His blood pressure was improved on ED presentation compared to his home measurements. He did take additional medications today. Headache did improve after treatment of the department. Troponin is negative. BMP similar compared to priors. Presentation is not consistent with hypertensive emergency, subarachnoid hemorrhage, CVA, ACS, dissection, PE. Discussed with patient home care for headache, hypertension. Discussed outpatient follow-up and return precautions.  Final Clinical Impression(s) / ED Diagnoses Final diagnoses:  Atypical chest pain  Essential hypertension    Rx / DC Orders ED Discharge Orders    None       Tilden Fossa, MD 06/13/20 1342

## 2020-06-27 ENCOUNTER — Ambulatory Visit: Payer: Self-pay | Admitting: Internal Medicine

## 2020-07-02 MED FILL — GABAPENTIN 300 MG CAPSULE: 300 | 30 days supply | Qty: 90 | Fill #1

## 2020-07-02 MED FILL — METOPROLOL TARTRATE 50 MG T: 50 | 30 days supply | Qty: 60 | Fill #1

## 2020-07-02 MED FILL — FLUoxetine HCL 40 MG CAPS: 40 | 30 days supply | Qty: 30 | Fill #1

## 2020-07-03 ENCOUNTER — Other Ambulatory Visit: Payer: Self-pay | Admitting: Internal Medicine

## 2020-07-03 ENCOUNTER — Other Ambulatory Visit: Payer: Self-pay

## 2020-07-03 ENCOUNTER — Telehealth (INDEPENDENT_AMBULATORY_CARE_PROVIDER_SITE_OTHER): Payer: HRSA Program | Admitting: Internal Medicine

## 2020-07-03 ENCOUNTER — Telehealth: Payer: Self-pay

## 2020-07-03 DIAGNOSIS — F331 Major depressive disorder, recurrent, moderate: Secondary | ICD-10-CM

## 2020-07-03 DIAGNOSIS — U071 COVID-19: Secondary | ICD-10-CM

## 2020-07-03 DIAGNOSIS — F419 Anxiety disorder, unspecified: Secondary | ICD-10-CM | POA: Diagnosis not present

## 2020-07-03 MED ORDER — FLUOXETINE HCL 60 MG PO TABS: 60.0000 mg | ORAL_TABLET | Freq: Every day | ORAL | 1 refills | Status: DC

## 2020-07-03 MED FILL — FLUoxetine HCL 20 MG CAPS: 20 | 30 days supply | Qty: 30 | Fill #0

## 2020-07-03 NOTE — Telephone Encounter (Signed)
ATC pt to follow-up and give [facility] resources since no new pt appt available at [facility] outpatient until March, reached out to give phone # for 24 hr helpline at [facility] and for the [facility], called both phone #s on file, no answer, [facility] for either, VMs full, will follow-up ASAP.

## 2020-07-03 NOTE — Progress Notes (Signed)
Virtual Visit via Telephone Note  I connected with Eric Keller, on 07/03/2020 at 10:48 AM by telephone due to the COVID-19 pandemic and verified that I am speaking with the correct person using two identifiers.   Consent: I discussed the limitations, risks, security and privacy concerns of performing an evaluation and management service by telephone and the availability of in person appointments. I also discussed with the patient that there may be a patient responsible charge related to this service. The patient expressed understanding and agreed to proceed.   Location of Patient: Home   Location of Provider: Clinic    Persons participating in Telemedicine visit: Abdulwahab Demelo Dr. Earlene Plater      History of Present Illness: Patient has a visit to follow up on depression. Reports that he just got results yesterday that he was COVID+ from a test on Sunday.   At last visit, 12/7, increased Prozac to 40 mg. Reports his anxiety and depression have been nearly unbearable some days. He has not experienced any side effects from the dose change. Had issues with his phone and ended up missing the visit with Jenel Lucks, LCSW. Referral for psych was denied due to lack of insurance.   Patient reports passive thoughts of suicide. No active plans and says several times "not planning to do anything crazy".   Mood is worse than others some days but overall feels very overwhelmed by his anxiety which then seems to make his depression worse.   Depression screen [place] 2/9 07/03/2020 05/27/2020 11/22/2019  Decreased Interest 3 2 1   Down, Depressed, Hopeless 3 2 0  PHQ - 2 Score 6 4 1   Altered sleeping 2 1 1   Tired, decreased energy 3 2 1   Change in appetite 1 0 0  Feeling bad or failure about yourself  1 1 1   Trouble concentrating 1 1 0  Moving slowly or fidgety/restless 0 1 0  Suicidal thoughts 1 0 0  PHQ-9 Score 15 10 4   Difficult doing work/chores Extremely dIfficult - -   GAD  7 : Generalized Anxiety Score 05/27/2020 11/22/2019  Nervous, Anxious, on Edge 2 1  Control/stop worrying 2 1  Worry too much - different things 2 1  Trouble relaxing 2 1  Restless 2 0  Easily annoyed or irritable 2 0  Afraid - awful might happen 2 0  Total GAD 7 Score 14 4  Anxiety Difficulty Somewhat difficult -      Past Medical History:  Diagnosis Date  . Alcohol withdrawal (HCC) 07/20/2019  . Anxiety   . Anxiety   . Depression   . H/O: substance abuse (HCC)   . Hypertension    No Known Allergies  Current Outpatient Medications on File Prior to Visit  Medication Sig Dispense Refill  . acetaminophen (TYLENOL) 500 MG tablet Take 500 mg by mouth every 8 (eight) hours as needed for headache.    . Cholecalciferol (VITAMIN D-3) 125 MCG (5000 UT) TABS Take 5,000 Units by mouth daily.    . cloNIDine (CATAPRES) 0.1 MG tablet Take 1 tablet (0.1 mg total) by mouth 3 (three) times daily. 90 tablet 2  . diphenhydrAMINE (BENADRYL) 25 MG tablet Take 25 mg by mouth at bedtime as needed for allergies.    FLUoxetine (PROZAC) 40 MG capsule Take 1 capsule (40 mg total) by mouth daily. 90 capsule 0  . gabapentin (NEURONTIN) 300 MG capsule Take 1 capsule (300 mg total) by mouth 3 (three) times daily. (Patient taking differently:  Take 300 mg by mouth 2 (two) times daily.) 90 capsule 2  . Magnesium 250 MG TABS Take 250 mg by mouth 2 (two) times daily.    . metoprolol tartrate (LOPRESSOR) 50 MG tablet Take 1 tablet (50 mg total) by mouth 2 (two) times daily. 60 tablet 2  . traZODone (DESYREL) 100 MG tablet Take 1 tablet (100 mg total) by mouth at bedtime as needed for sleep. 30 tablet 2   No current facility-administered medications on file prior to visit.    Observations/Objective: NAD. Speaking clearly.  Work of breathing normal.  Alert and oriented. Mood appropriate.   Assessment and Plan: 1. Moderate episode of recurrent major depressive disorder (HCC) PHQ-9 worsened. Will increase Prozac  to 60 mg. Patient with complicated history of polysubstance abuse and previous behavioral health admission. Has passive suicidal thoughts but states several times has no active plans of hurting himself. Given resources for 24/7 behavioral health services. Have placed referral. In meantime, investigating if can have virtual visit with the behavioral health urgent care given his COVID+ status.  - Ambulatory referral to Psychology - FLUoxetine 60 MG TABS; Take 60 mg by mouth daily.  Dispense: 90 tablet; Refill: 1  2. Anxiety GAD-7 worsened. Plan as above.  - Ambulatory referral to Psychology - FLUoxetine 60 MG TABS; Take 60 mg by mouth daily.  Dispense: 90 tablet; Refill: 1  3. COVID-19 virus infection Mildly symptomatic. Continue supportive care. Discussed isolation would end 1/15 after 5 days if afebrile and improving. Continue to adhere to 3W's--especially masking for another 5 days.   Follow Up Instructions: 4-6 week f/u, psych f/u    I discussed the assessment and treatment plan with the patient. The patient was provided an opportunity to ask questions and all were answered. The patient agreed with the plan and demonstrated an understanding of the instructions.   The patient was advised to call back or seek an in-person evaluation if the symptoms worsen or if the condition fails to improve as anticipated.     I provided 26 minutes total of non-face-to-face time during this encounter including median intraservice time, reviewing previous notes, investigations, ordering medications, medical decision making, coordinating care and patient verbalized understanding at the end of the visit.    [name], D.O. Primary Care at [facility]  07/03/2020, 10:48 AM

## 2020-07-04 NOTE — Telephone Encounter (Signed)
Spoke w/ pt and gave info for [facility] 24 hr line and [facility], pt will reach out, pt also aware that referral will be in place and someone will contact him for scheduling, pt advised to contact clinic with any further needs/concerns, pt verbalized understanding

## 2020-07-16 MED FILL — cloNIDine HCL 0.1 MG TABS: 0.1 | 30 days supply | Qty: 90 | Fill #1

## 2020-07-16 MED FILL — TRAZODONE HCL 100 MG TABS: 100 | 30 days supply | Qty: 30 | Fill #2

## 2020-07-30 MED FILL — GABAPENTIN 300 MG CAPSULE: 300 | 30 days supply | Qty: 90 | Fill #2

## 2020-07-30 MED FILL — FLUoxetine HCL 40 MG CAPS: 40 | 30 days supply | Qty: 30 | Fill #2

## 2020-07-30 MED FILL — METOPROLOL TARTRATE 50 MG T: 50 | 30 days supply | Qty: 60 | Fill #2

## 2020-08-08 ENCOUNTER — Ambulatory Visit (INDEPENDENT_AMBULATORY_CARE_PROVIDER_SITE_OTHER): Payer: No Payment, Other | Admitting: Professional

## 2020-08-08 ENCOUNTER — Other Ambulatory Visit: Payer: Self-pay

## 2020-08-08 DIAGNOSIS — F419 Anxiety disorder, unspecified: Secondary | ICD-10-CM | POA: Diagnosis not present

## 2020-08-08 DIAGNOSIS — F331 Major depressive disorder, recurrent, moderate: Secondary | ICD-10-CM | POA: Insufficient documentation
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
## 2020-08-15 MED FILL — cloNIDine HCL 0.1 MG TABS: 0.1 | 30 days supply | Qty: 90 | Fill #2

## 2020-08-15 MED FILL — TRAZODONE HCL 100 MG TABS: 100 | 30 days supply | Qty: 30 | Fill #0

## 2020-08-18 ENCOUNTER — Other Ambulatory Visit: Payer: Self-pay | Admitting: Internal Medicine

## 2020-08-18 DIAGNOSIS — F1920 Other psychoactive substance dependence, uncomplicated: Secondary | ICD-10-CM

## 2020-08-25 ENCOUNTER — Other Ambulatory Visit: Payer: Self-pay | Admitting: Internal Medicine

## 2020-08-25 DIAGNOSIS — F331 Major depressive disorder, recurrent, moderate: Secondary | ICD-10-CM

## 2020-08-25 DIAGNOSIS — F419 Anxiety disorder, unspecified: Secondary | ICD-10-CM

## 2020-08-26 ENCOUNTER — Telehealth: Payer: Self-pay | Admitting: Internal Medicine

## 2020-08-26 MED FILL — GABAPENTIN 300 MG CAPSULE: 300 | 30 days supply | Qty: 60 | Fill #0

## 2020-08-26 NOTE — Telephone Encounter (Signed)
1) Medication(s) Requested (by name): FLUoxetine (PROZAC) - Patient has 5 tablets left.   2) Pharmacy of Choice: [facility] on [address]

## 2020-08-27 ENCOUNTER — Other Ambulatory Visit: Payer: Self-pay | Admitting: Internal Medicine

## 2020-08-27 DIAGNOSIS — F419 Anxiety disorder, unspecified: Secondary | ICD-10-CM

## 2020-08-27 DIAGNOSIS — F331 Major depressive disorder, recurrent, moderate: Secondary | ICD-10-CM

## 2020-08-27 MED ORDER — FLUOXETINE HCL 60 MG PO TABS: 60.0000 mg | ORAL_TABLET | Freq: Every day | ORAL | 1 refills | Status: DC

## 2020-08-27 MED FILL — FLUoxetine HCL 60 MG TABS: 60 | 30 days supply | Qty: 30 | Fill #0

## 2020-08-27 NOTE — Telephone Encounter (Signed)
Refill sent.   [name], D.O. Primary Care at [facility]  08/27/2020, 9:34 AM

## 2020-08-29 ENCOUNTER — Ambulatory Visit (HOSPITAL_COMMUNITY): Payer: No Payment, Other | Admitting: Professional

## 2020-08-29 ENCOUNTER — Other Ambulatory Visit: Payer: Self-pay

## 2020-08-29 NOTE — Progress Notes (Signed)
Pt reports this is not a good time due to starting a new job. Pt was at work currently. Pt reports he would like to reschedule. Cln able to reschedule for 3/31 @ 2p. Pt agrees. Pt denies SI/HI/AVH.

## 2020-09-03 ENCOUNTER — Other Ambulatory Visit: Payer: Self-pay | Admitting: Internal Medicine

## 2020-09-03 ENCOUNTER — Telehealth (INDEPENDENT_AMBULATORY_CARE_PROVIDER_SITE_OTHER): Payer: Self-pay | Admitting: Internal Medicine

## 2020-09-03 DIAGNOSIS — F419 Anxiety disorder, unspecified: Secondary | ICD-10-CM

## 2020-09-03 DIAGNOSIS — F331 Major depressive disorder, recurrent, moderate: Secondary | ICD-10-CM

## 2020-09-03 MED ORDER — GABAPENTIN 400 MG PO CAPS
400.0000 mg | ORAL_CAPSULE | Freq: Three times a day (TID) | ORAL | 1 refills | Status: DC
Start: 2020-09-03 — End: 2020-09-08

## 2020-09-03 MED ORDER — FLUOXETINE HCL 40 MG PO CAPS
ORAL_CAPSULE | ORAL | 1 refills | Status: DC
Start: 2020-09-03 — End: 2020-09-08

## 2020-09-03 MED ORDER — FLUOXETINE HCL 20 MG PO TABS: ORAL_TABLET | ORAL | 1 refills | Status: DC

## 2020-09-03 NOTE — Progress Notes (Signed)
Follow up depression and anxiety

## 2020-09-03 NOTE — Progress Notes (Signed)
Virtual Visit via Telephone Note  I connected with Eric Keller, on 09/03/2020 at 4:20 PM by telephone due to the COVID-19 pandemic and verified that I am speaking with the correct person using two identifiers.   Consent: I discussed the limitations, risks, security and privacy concerns of performing an evaluation and management service by telephone and the availability of in person appointments. I also discussed with the patient that there may be a patient responsible charge related to this service. The patient expressed understanding and agreed to proceed.   Location of Patient: Work   Government social research officer of Provider: Clinic    Persons participating in Telemedicine visit: Raylen Tangonan Dr. Earlene Plater      History of Present Illness: Patient has a visit to follow up on depression. At last visit, had unbearable anxiety and depression. We increased Prozac to 60 mg. Patient reports that the new Rx seems to have never gone through the pharmacy and he continued the 40 mg. He would like to still go up to 60 mg daily. Has spoken with counselor twice and has an upcoming appointment with a psychiatrist. Depression seems to be better especially with starting a new job.  Still having severe times of anxiety. Occurs sometimes upon waking. Has found that Gabapentin helps without impairing him. Asks about increasing dose.   Depression screen [facility] 2/9 09/03/2020 07/03/2020 05/27/2020  Decreased Interest 1 3 2   Down, Depressed, Hopeless 3 3 2   PHQ - 2 Score 4 6 4   Altered sleeping 2 2 1   Tired, decreased energy 2 3 2   Change in appetite 0 1 0  Feeling bad or failure about yourself  1 1 1   Trouble concentrating 0 1 1  Moving slowly or fidgety/restless - 0 1  Suicidal thoughts 0 1 0  PHQ-9 Score 9 15 10   Difficult doing work/chores - Extremely dIfficult -  Some encounter information is confidential and restricted. Go to Review Flowsheets activity to see all data.   GAD 7 : Generalized Anxiety  Score 09/03/2020 07/03/2020 05/27/2020 11/22/2019  Nervous, Anxious, on Edge 3 3 2 1   Control/stop worrying 3 3 2 1   Worry too much - different things 3 3 2 1   Trouble relaxing 3 2 2 1   Restless 3 2 2  0  Easily annoyed or irritable 3 1 2  0  Afraid - awful might happen 2 2 2  0  Total GAD 7 Score 20 16 14 4   Anxiety Difficulty - Extremely difficult Somewhat difficult -    Past Medical History:  Diagnosis Date  . Alcohol withdrawal (HCC) 07/20/2019  . Anxiety   . Anxiety   . Depression   . H/O: substance abuse (HCC)   . Hypertension    No Known Allergies  Current Outpatient Medications on File Prior to Visit  Medication Sig Dispense Refill  . acetaminophen (TYLENOL) 500 MG tablet Take 500 mg by mouth every 8 (eight) hours as needed for headache.    . Cholecalciferol (VITAMIN D-3) 125 MCG (5000 UT) TABS Take 5,000 Units by mouth daily.    . cloNIDine (CATAPRES) 0.1 MG tablet Take 1 tablet (0.1 mg total) by mouth 3 (three) times daily. 90 tablet 2  . FLUoxetine HCl 60 MG TABS Take 60 mg by mouth daily. 90 tablet 1  . gabapentin (NEURONTIN) 300 MG capsule Take 1 capsule (300 mg total) by mouth 2 (two) times daily. 60 capsule 1  . Magnesium 250 MG TABS Take 250 mg by mouth 2 (two) times daily.     metoprolol tartrate (LOPRESSOR) 50 MG tablet Take 1 tablet (50 mg total) by mouth 2 (two) times daily. 60 tablet 2  . traZODone (DESYREL) 100 MG tablet Take 1 tablet (100 mg total) by mouth at bedtime as needed for sleep. 30 tablet 2  . diphenhydrAMINE (BENADRYL) 25 MG tablet Take 25 mg by mouth at bedtime as needed for allergies.     No current facility-administered medications on file prior to visit.    Observations/Objective: NAD. Speaking clearly.  Work of breathing normal.  Alert and oriented. Mood appropriate.   Assessment and Plan: 1. Moderate episode of recurrent major depressive disorder (HCC) PHQ-9 has improved from moderately severe to mild depression. Will increase Prozac as  still having limited episodes of depressed mood and may have dual benefit for anxiety. No suicidal ideations. Continue to follow with therapist and establish with psych.  - FLUoxetine (PROZAC) 40 MG capsule; Combine with 20 mg tablet daily for total of 60 mg.  Dispense: 90 capsule; Refill: 1 - FLUoxetine (PROZAC) 20 MG tablet; Combine with 40 mg tablet daily for total of 60 mg.  Dispense: 90 tablet; Refill: 1  2. Anxiety GAD-7 shows severe anxiety. Will increase Prozac and Gabapentin doses. Continue to follow with therapist and establish with psych.  - FLUoxetine (PROZAC) 40 MG capsule; Combine with 20 mg tablet daily for total of 60 mg.  Dispense: 90 capsule; Refill: 1 - FLUoxetine (PROZAC) 20 MG tablet; Combine with 40 mg tablet daily for total of 60 mg.  Dispense: 90 tablet; Refill: 1 - gabapentin (NEURONTIN) 400 MG capsule; Take 1 capsule (400 mg total) by mouth 3 (three) times daily.  Dispense: 270 capsule; Refill: 1   Follow Up Instructions: Follow up with psych, 3 month f/u with PCP    I discussed the assessment and treatment plan with the patient. The patient was provided an opportunity to ask questions and all were answered. The patient agreed with the plan and demonstrated an understanding of the instructions.   The patient was advised to call back or seek an in-person evaluation if the symptoms worsen or if the condition fails to improve as anticipated.     I provided 14 minutes total of non-face-to-face time during this encounter including median intraservice time, reviewing previous notes, investigations, ordering medications, medical decision making, coordinating care and patient verbalized understanding at the end of the visit.    [name], D.O. Primary Care at [place]  09/03/2020, 4:20 PM

## 2020-09-05 ENCOUNTER — Other Ambulatory Visit: Payer: Self-pay | Admitting: Internal Medicine

## 2020-09-05 DIAGNOSIS — I10 Essential (primary) hypertension: Secondary | ICD-10-CM

## 2020-09-08 ENCOUNTER — Other Ambulatory Visit: Payer: Self-pay

## 2020-09-08 ENCOUNTER — Encounter (HOSPITAL_COMMUNITY): Payer: Self-pay

## 2020-09-08 ENCOUNTER — Other Ambulatory Visit (HOSPITAL_COMMUNITY): Payer: Self-pay | Admitting: Psychiatry

## 2020-09-08 ENCOUNTER — Telehealth (INDEPENDENT_AMBULATORY_CARE_PROVIDER_SITE_OTHER): Payer: No Payment, Other | Admitting: Psychiatry

## 2020-09-08 DIAGNOSIS — F332 Major depressive disorder, recurrent severe without psychotic features: Secondary | ICD-10-CM

## 2020-09-08 DIAGNOSIS — F419 Anxiety disorder, unspecified: Secondary | ICD-10-CM | POA: Diagnosis not present

## 2020-09-08 DIAGNOSIS — F331 Major depressive disorder, recurrent, moderate: Secondary | ICD-10-CM | POA: Diagnosis not present

## 2020-09-08 MED ORDER — ESCITALOPRAM OXALATE 10 MG PO TABS: 20.0000 mg | ORAL_TABLET | Freq: Every day | ORAL | 2 refills | Status: DC

## 2020-09-08 MED ORDER — GABAPENTIN 400 MG PO CAPS
400.0000 mg | ORAL_CAPSULE | Freq: Three times a day (TID) | ORAL | 1 refills | Status: DC
Start: 2020-09-08 — End: 2020-09-08

## 2020-09-08 MED ORDER — QUETIAPINE FUMARATE 25 MG PO TABS: 25.0000 mg | ORAL_TABLET | Freq: Every day | ORAL | 2 refills | Status: DC

## 2020-09-08 MED FILL — ESCITALOPRAM 10 MG TABLET: 10 | 30 days supply | Qty: 60 | Fill #0

## 2020-09-08 MED FILL — QUETIAPINE FUMARATE 25 MG T: 25 | 30 days supply | Qty: 30 | Fill #0

## 2020-09-08 MED FILL — METOPROLOL TARTRATE 50 MG T: 50 | 30 days supply | Qty: 60 | Fill #0

## 2020-09-08 NOTE — Progress Notes (Signed)
Psychiatric Initial Adult Assessment   Patient Identification: Eric Keller MRN:  [id number] Date of Evaluation:  09/08/2020 Referral Source: therapist at [facility] Chief Complaint:   Chief Complaint    Anxiety; Establish Care; Depression     Visit Diagnosis:    ICD-10-CM   1. Major depressive disorder, recurrent episode, moderate (HCC)  F33.1   2. MDD (major depressive disorder), recurrent severe, without psychosis (HCC)  F33.2     History of Present Illness: 35 year old male presents for treatment of depression and anxiety.  Long history of depression anxiety with no psychiatric or mental health assistance, managed by his PCP.  He rates his depression a 7 out of 10 with 10 being the highest and intermittent passive suicidal thoughts that things might be better if he were dead.  Denies any suicide plans or intent or past suicide attempts.  His anxiety is a 10 out of 10 with several panic attacks per week.  His sleep is difficult on Sundays.  Appetite is fair with no weight loss.  Denies homicidal ideations, hallucinations, and mania symptoms.  No substance use or nicotine use.  Past alcohol and opiate use disorder.  Reports exercise does help his symptoms, does not have time to practice regularly.  He does work odd hours as a Product manager.  His father, sister, and brother do suffer from depression and anxiety but not in his level.  He is currently taking Prozac 40 mg daily and was taking trazodone 100 mg as needed for sleep, discontinued use due to "unbelievable weird dreams."  Discussed medications and decided to discontinue the Prozac and start Lexapro 20 mg daily along with Seroquel 25 mg as needed at bedtime.  He is currently working and recently moved in with his father to help care for him as he has memory issues which can be difficult at times.  He is agreeable to establish therapy invested to get the help he needs, follow-up in 2 months.  Associated Signs/Symptoms: Depression  Symptoms:  depressed mood, fatigue, anxiety, (Hypo) Manic Symptoms:  none Anxiety Symptoms:  Excessive Worry, Panic Symptoms, Psychotic Symptoms:  none PTSD Symptoms: Had a traumatic exposure:  mother and grandfather at the age of 39  Past Psychiatric History: depression, anxiety, past opiate and alcohol use disorders  Previous Psychotropic Medications: Yes   Substance Abuse History in the last 12 months:  No.  Consequences of Substance Abuse: NA  Past Medical History:  Past Medical History:  Diagnosis Date  . Alcohol withdrawal (HCC) 07/20/2019  . Anxiety   . Anxiety   . Depression   . H/O: substance abuse (HCC)   . Hypertension     Past Surgical History:  Procedure Laterality Date  . WISDOM TOOTH EXTRACTION      Family Psychiatric History: father, brother, sister with depression and anxiety  Family History:  Family History  Problem Relation Age of Onset  . Cancer Mother   . Hypertension Paternal Grandfather   . Heart disease Paternal Grandfather        MIs  . Heart disease Other   . Stroke Other     Social History:   Social History   Socioeconomic History  . Marital status: Single    Spouse name: Not on file  . Number of children: Not on file  . Years of education: Not on file  . Highest education level: Not on file  Occupational History  . Not on file  Tobacco Use  . Smoking status: Never Smoker  .  Smokeless tobacco: Never Used  Vaping Use  . Vaping Use: Never used  Substance and Sexual Activity  . Alcohol use: Yes    Comment: daily 5th of liquor  . Drug use: Not Currently    Types: Marijuana    Comment: Vicodin Abuse, BEnzos  . Sexual activity: Not on file  Other Topics Concern  . Not on file  Social History Narrative  . Not on file   Social Determinants of Health   Financial Resource Strain: Not on file  Food Insecurity: Not on file  Transportation Needs: Not on file  Physical Activity: Not on file  Stress: Not on file  Social  Connections: Not on file    Additional Social History: works odd hours and recently moved in with his father who has memory issues.  Allergies:  No Known Allergies  Metabolic Disorder Labs: No results found for: HGBA1C, MPG No results found for: PROLACTIN No results found for: CHOL, TRIG, HDL, CHOLHDL, VLDL, LDLCALC Lab Results  Component Value Date   TSH 2.090 05/11/2018    Therapeutic Level Labs: No results found for: LITHIUM No results found for: CBMZ No results found for: VALPROATE  Current Medications: Current Outpatient Medications  Medication Sig Dispense Refill  . acetaminophen (TYLENOL) 500 MG tablet Take 500 mg by mouth every 8 (eight) hours as needed for headache.    . Cholecalciferol (VITAMIN D-3) 125 MCG (5000 UT) TABS Take 5,000 Units by mouth daily.    . cloNIDine (CATAPRES) 0.1 MG tablet Take 1 tablet (0.1 mg total) by mouth 3 (three) times daily. 90 tablet 2  . diphenhydrAMINE (BENADRYL) 25 MG tablet Take 25 mg by mouth at bedtime as needed for allergies.    Marland Kitchen FLUoxetine (PROZAC) 20 MG tablet Combine with 40 mg tablet daily for total of 60 mg. 90 tablet 1  . FLUoxetine (PROZAC) 40 MG capsule Combine with 20 mg tablet daily for total of 60 mg. 90 capsule 1  . gabapentin (NEURONTIN) 400 MG capsule Take 1 capsule (400 mg total) by mouth 3 (three) times daily. 270 capsule 1  . Magnesium 250 MG TABS Take 250 mg by mouth 2 (two) times daily.    . metoprolol tartrate (LOPRESSOR) 50 MG tablet TAKE 1 TABLET (50 MG TOTAL) BY MOUTH 2 (TWO) TIMES DAILY. 60 tablet 2  . traZODone (DESYREL) 100 MG tablet Take 1 tablet (100 mg total) by mouth at bedtime as needed for sleep. 30 tablet 2   No current facility-administered medications for this visit.    Musculoskeletal: Strength & Muscle Tone: denies any issues Gait & Station: denies any issues Patient leans: denies any issues  Psychiatric Specialty Exam: Review of Systems  Psychiatric/Behavioral: Positive for dysphoric  mood. The patient is nervous/anxious.   All other systems reviewed and are negative.   There were no vitals taken for this visit.There is no height or weight on file to calculate BMI.  General Appearance: telephone assessment  Eye Contact:  telephone encounter  Speech:  Normal Rate  Volume:  Normal  Mood:  Anxious and Depressed  Affect:  telephone encounter  Thought Process:  Coherent and Descriptions of Associations: Intact  Orientation:  Full (Time, Place, and Person)  Thought Content:  WDL and Logical  Suicidal Thoughts:  No  Homicidal Thoughts:  No  Memory:  Immediate;   Good Recent;   Good Remote;   Good  Judgement:  Good  Insight:  Good  Psychomotor Activity:  Normal  Concentration:  Concentration: Good and  Attention Span: Good  Recall:  Good  Fund of Knowledge:Good  Language: Good  Akathisia:  No  Handed:  Right  AIMS (if indicated):  not done  Assets:  Housing Leisure Time Physical Health Resilience Social Support Vocational/Educational  ADL's:  Intact  Cognition: WNL  Sleep:  Poor   Screenings: AIMS   Flowsheet Row [facility]  AIMS Total Score 0    AUDIT   Flowsheet Row [facility]  Alcohol Use Disorder Identification Test Final Score (AUDIT) 12    GAD-7   Flowsheet Row Telemedicine from 09/03/2020 in Primary Care at [facility] Telemedicine from 07/03/2020 in Primary Care at [facility] Telemedicine from 05/27/2020 in Primary Care at [facility] Office Visit from 11/22/2019 in Primary Care at [facility]  Total GAD-7 Score 20 16 14 4     PHQ2-9   Flowsheet Row Video Visit from 09/08/2020 in [facility] Telemedicine from 09/03/2020 in Primary Care at [facility] Counselor from 08/08/2020 in [facility] Telemedicine from 07/03/2020 in Primary Care  at [facility] Telemedicine from 05/27/2020 in Primary Care at [facility]  PHQ-2 Total Score 6 4 4 6 4   PHQ-9 Total Score 15 9 12 15 10     Flowsheet Row Video Visit from 09/08/2020 in [facility] Counselor from 08/08/2020 in [facility] Admission (Discharged) [facility]  C-SSRS RISK CATEGORY Error: Q3, 4, or 5 should not be populated when Q2 is No No Risk Low Risk      Assessment and Plan:  Major depressive disorder, recurrent, moderate: -Discontinue Prozac 40 mg daily -Start Lexapro 20 mg daily  -Continue therapy  General anxiety disorder: -Continue Gabapentin 300 MG TID PRN  Virtual Visit via Telephone Note  I connected with 08/10/2020 on 09/08/20 at 10:00 AM EDT by telephone and verified that I am speaking with the correct person using two identifiers.  Location: Patient: work Provider: home   I discussed the limitations, risks, security and privacy concerns of performing an evaluation and management service by telephone and the availability of in person appointments. I also discussed with the patient that there may be a patient responsible charge related to this service. The patient expressed understanding and agreed to proceed.  Follow Up Instructions: Follow-up in 2 months   I discussed the assessment and treatment plan with the patient. The patient was provided an opportunity to ask questions and all were answered. The patient agreed with the plan and demonstrated an understanding of the instructions.   The patient was advised to call back or seek an in-person evaluation if the symptoms worsen or if the condition fails to improve as anticipated.  I provided 30 minutes of non-face-to-face time during this encounter.   10/04/2019, NP    [name], NP 3/21/202210:16 AM

## 2020-09-17 ENCOUNTER — Other Ambulatory Visit: Payer: Self-pay | Admitting: Internal Medicine

## 2020-09-17 DIAGNOSIS — F1920 Other psychoactive substance dependence, uncomplicated: Secondary | ICD-10-CM

## 2020-09-18 ENCOUNTER — Ambulatory Visit (HOSPITAL_COMMUNITY): Payer: No Payment, Other | Admitting: Professional

## 2020-09-18 ENCOUNTER — Other Ambulatory Visit: Payer: Self-pay

## 2020-09-18 ENCOUNTER — Telehealth: Payer: Self-pay | Admitting: Internal Medicine

## 2020-09-18 ENCOUNTER — Other Ambulatory Visit: Payer: Self-pay | Admitting: Internal Medicine

## 2020-09-18 ENCOUNTER — Telehealth (HOSPITAL_COMMUNITY): Payer: Self-pay | Admitting: Professional

## 2020-09-18 DIAGNOSIS — F1920 Other psychoactive substance dependence, uncomplicated: Secondary | ICD-10-CM

## 2020-09-18 MED ORDER — CLONIDINE HCL 0.1 MG PO TABS: 0.1000 mg | ORAL_TABLET | Freq: Three times a day (TID) | ORAL | 2 refills | Status: DC

## 2020-09-18 NOTE — Telephone Encounter (Signed)
See call log 

## 2020-09-18 NOTE — Telephone Encounter (Signed)
Pt states a virtual appointment happened a few days ago, some medication was refilled except one that if he doesn't have it, he might end up in the emergency room. The medication is cloNIDine (CATAPRES) 0.1 MG tablet [[id number]]  Pharmacy  [facility] [place], [place] - [address]  [address], [place] [address]  Phone:  [phone] Fax:  [phone]

## 2020-09-18 NOTE — Telephone Encounter (Signed)
It looks like this Rx was already authorized earlier today with refills.   [name], D.O. Primary Care at [facility]  09/18/2020, 4:37 PM

## 2020-09-19 MED FILL — cloNIDine HCL 0.1 MG TABS: 0.1 | 30 days supply | Qty: 90 | Fill #0

## 2020-09-20 ENCOUNTER — Other Ambulatory Visit: Payer: Self-pay

## 2020-10-06 ENCOUNTER — Other Ambulatory Visit: Payer: Self-pay

## 2020-10-06 ENCOUNTER — Telehealth (HOSPITAL_COMMUNITY): Payer: No Payment, Other

## 2020-10-15 ENCOUNTER — Other Ambulatory Visit: Payer: Self-pay

## 2020-10-15 MED FILL — Metoprolol Tartrate Tab 50 MG: ORAL | 30 days supply | Qty: 60 | Fill #0 | Status: AC

## 2020-10-15 MED FILL — Gabapentin Cap 400 MG: ORAL | 30 days supply | Qty: 90 | Fill #0 | Status: CN

## 2020-10-16 ENCOUNTER — Other Ambulatory Visit: Payer: Self-pay

## 2020-10-16 MED FILL — Gabapentin Cap 400 MG: ORAL | 30 days supply | Qty: 90 | Fill #0 | Status: AC

## 2020-10-17 ENCOUNTER — Other Ambulatory Visit: Payer: Self-pay

## 2020-10-24 ENCOUNTER — Other Ambulatory Visit: Payer: Self-pay

## 2020-10-24 MED FILL — Quetiapine Fumarate Tab 25 MG: ORAL | 30 days supply | Qty: 30 | Fill #0 | Status: AC

## 2020-10-24 MED FILL — Clonidine HCl Tab 0.1 MG: ORAL | 30 days supply | Qty: 90 | Fill #0 | Status: AC

## 2020-10-24 MED FILL — Escitalopram Oxalate Tab 10 MG (Base Equiv): ORAL | 30 days supply | Qty: 60 | Fill #0 | Status: AC

## 2020-10-30 ENCOUNTER — Ambulatory Visit (HOSPITAL_COMMUNITY)
Admission: EM | Admit: 2020-10-30 | Discharge: 2020-10-30 | Disposition: A | Discharge status: Other Acute Inpatient Hospital | Payer: No Payment, Other | Attending: Professional | Admitting: Professional

## 2020-10-30 ENCOUNTER — Other Ambulatory Visit: Payer: Self-pay

## 2020-10-30 ENCOUNTER — Inpatient Hospital Stay (HOSPITAL_COMMUNITY)
Admission: EM | Admit: 2020-10-30 | Discharge: 2020-11-04 | DRG: 897 | Disposition: A | Discharge status: Home / Self Care | Payer: Self-pay | Source: Ambulatory Visit | Attending: Family Medicine | Admitting: Family Medicine

## 2020-10-30 ENCOUNTER — Encounter (HOSPITAL_COMMUNITY): Payer: Self-pay | Admitting: *Deleted

## 2020-10-30 DIAGNOSIS — R45851 Suicidal ideations: Secondary | ICD-10-CM | POA: Diagnosis not present

## 2020-10-30 DIAGNOSIS — F13939 Sedative, hypnotic or anxiolytic use, unspecified with withdrawal, unspecified: Secondary | ICD-10-CM

## 2020-10-30 DIAGNOSIS — F329 Major depressive disorder, single episode, unspecified: Secondary | ICD-10-CM | POA: Diagnosis present

## 2020-10-30 DIAGNOSIS — Z8659 Personal history of other mental and behavioral disorders: Secondary | ICD-10-CM | POA: Diagnosis not present

## 2020-10-30 DIAGNOSIS — F411 Generalized anxiety disorder: Secondary | ICD-10-CM | POA: Diagnosis present

## 2020-10-30 DIAGNOSIS — Z8249 Family history of ischemic heart disease and other diseases of the circulatory system: Secondary | ICD-10-CM

## 2020-10-30 DIAGNOSIS — F1310 Sedative, hypnotic or anxiolytic abuse, uncomplicated: Secondary | ICD-10-CM

## 2020-10-30 DIAGNOSIS — F1210 Cannabis abuse, uncomplicated: Secondary | ICD-10-CM

## 2020-10-30 DIAGNOSIS — Z20822 Contact with and (suspected) exposure to covid-19: Secondary | ICD-10-CM | POA: Diagnosis present

## 2020-10-30 DIAGNOSIS — F13239 Sedative, hypnotic or anxiolytic dependence with withdrawal, unspecified: Secondary | ICD-10-CM | POA: Insufficient documentation

## 2020-10-30 DIAGNOSIS — Z818 Family history of other mental and behavioral disorders: Secondary | ICD-10-CM | POA: Diagnosis not present

## 2020-10-30 DIAGNOSIS — F1020 Alcohol dependence, uncomplicated: Secondary | ICD-10-CM | POA: Diagnosis not present

## 2020-10-30 DIAGNOSIS — Z79899 Other long term (current) drug therapy: Secondary | ICD-10-CM

## 2020-10-30 DIAGNOSIS — I10 Essential (primary) hypertension: Secondary | ICD-10-CM | POA: Diagnosis present

## 2020-10-30 DIAGNOSIS — F1010 Alcohol abuse, uncomplicated: Secondary | ICD-10-CM

## 2020-10-30 DIAGNOSIS — F10230 Alcohol dependence with withdrawal, uncomplicated: Principal | ICD-10-CM | POA: Diagnosis present

## 2020-10-30 DIAGNOSIS — R112 Nausea with vomiting, unspecified: Secondary | ICD-10-CM | POA: Diagnosis present

## 2020-10-30 DIAGNOSIS — F10939 Alcohol use, unspecified with withdrawal, unspecified: Secondary | ICD-10-CM | POA: Diagnosis present

## 2020-10-30 DIAGNOSIS — F332 Major depressive disorder, recurrent severe without psychotic features: Secondary | ICD-10-CM

## 2020-10-30 DIAGNOSIS — F10239 Alcohol dependence with withdrawal, unspecified: Principal | ICD-10-CM

## 2020-10-30 DIAGNOSIS — F32A Depression, unspecified: Secondary | ICD-10-CM | POA: Diagnosis present

## 2020-10-30 LAB — COMPREHENSIVE METABOLIC PANEL
ALT: 28 U/L (ref 0–44)
AST: 30 U/L (ref 15–41)
Albumin: 4 g/dL (ref 3.5–5.0)
Alkaline Phosphatase: 55 U/L (ref 38–126)
Anion gap: 7 (ref 5–15)
BUN: 12 mg/dL (ref 6–20)
CO2: 30 mmol/L (ref 22–32)
Calcium: 9 mg/dL (ref 8.9–10.3)
Chloride: 100 mmol/L (ref 98–111)
Creatinine, Ser: 1.17 mg/dL (ref 0.61–1.24)
GFR, Estimated: >60 mL/min (ref >60)
Glucose, Bld: 88 mg/dL (ref 70–99)
Potassium: 4.3 mmol/L (ref 3.5–5.1)
Sodium: 137 mmol/L (ref 135–145)
Total Bilirubin: 1 mg/dL (ref 0.3–1.2)
Total Protein: 7 g/dL (ref 6.5–8.1)

## 2020-10-30 LAB — CBC WITH DIFFERENTIAL/PLATELET
Abs Immature Granulocytes: 0.02 10*3/uL (ref 0.00–0.07)
Basophils Absolute: 0 10*3/uL (ref 0.0–0.1)
Basophils Relative: 1 %
Eosinophils Absolute: 0.3 10*3/uL (ref 0.0–0.5)
Eosinophils Relative: 4 %
HCT: 49.4 % (ref 39.0–52.0)
Hemoglobin: 16.5 g/dL (ref 13.0–17.0)
Immature Granulocytes: 0 %
Lymphocytes Relative: 35 %
Lymphs Abs: 2.5 10*3/uL (ref 0.7–4.0)
MCH: 33.4 pg (ref 26.0–34.0)
MCHC: 33.4 g/dL (ref 30.0–36.0)
MCV: 100 fL (ref 80.0–100.0)
Monocytes Absolute: 0.6 10*3/uL (ref 0.1–1.0)
Monocytes Relative: 8 %
Neutro Abs: 3.6 10*3/uL (ref 1.7–7.7)
Neutrophils Relative %: 52 %
Platelets: 278 10*3/uL (ref 150–400)
RBC: 4.94 MIL/uL (ref 4.22–5.81)
RDW: 13.8 % (ref 11.5–15.5)
WBC: 7 10*3/uL (ref 4.0–10.5)
nRBC: 0 % (ref 0.0–0.2)

## 2020-10-30 LAB — ETHANOL: Alcohol, Ethyl (B): 18 mg/dL — ABNORMAL HIGH (ref <10)

## 2020-10-30 LAB — RAPID URINE DRUG SCREEN, HOSP PERFORMED
Amphetamines: NOT DETECTED
Barbiturates: NOT DETECTED
Benzodiazepines: NOT DETECTED
Cocaine: NOT DETECTED
Opiates: NOT DETECTED
Tetrahydrocannabinol: POSITIVE — AB

## 2020-10-30 LAB — RESP PANEL BY RT-PCR (FLU A&B, COVID) ARPGX2
Influenza A by PCR: NEGATIVE
Influenza B by PCR: NEGATIVE
SARS Coronavirus 2 by RT PCR: NEGATIVE

## 2020-10-30 LAB — LIPASE, BLOOD: Lipase: 30 U/L (ref 11–51)

## 2020-10-30 LAB — SALICYLATE LEVEL: Salicylate Lvl: <7 mg/dL — ABNORMAL LOW (ref 7.0–30.0)

## 2020-10-30 LAB — ACETAMINOPHEN LEVEL: Acetaminophen (Tylenol), Serum: <10 ug/mL — ABNORMAL LOW (ref 10–30)

## 2020-10-30 MED ORDER — LORAZEPAM 2 MG/ML IJ SOLN: 0.0000 mg | Freq: Two times a day (BID) | INTRAMUSCULAR | Status: DC

## 2020-10-30 MED ORDER — THIAMINE HCL 100 MG/ML IJ SOLN
100.0000 mg | Freq: Every day | INTRAMUSCULAR | Status: DC
  Administered 2020-10-30: 100 mg via INTRAVENOUS
  Filled 2020-10-30: qty 2

## 2020-10-30 MED ORDER — LORAZEPAM 1 MG PO TABS: 0.0000 mg | ORAL_TABLET | Freq: Two times a day (BID) | ORAL | Status: DC

## 2020-10-30 MED ORDER — LORAZEPAM 2 MG/ML IJ SOLN
0.0000 mg | Freq: Four times a day (QID) | INTRAMUSCULAR | Status: DC
  Administered 2020-10-30: 2 mg via INTRAVENOUS
  Filled 2020-10-30: qty 2

## 2020-10-30 MED ORDER — THIAMINE HCL 100 MG PO TABS: 100.0000 mg | ORAL_TABLET | Freq: Every day | ORAL | Status: DC

## 2020-10-30 MED ORDER — ONDANSETRON 4 MG PO TBDP
4.0000 mg | ORAL_TABLET | Freq: Once | ORAL | Status: AC
  Administered 2020-10-30: 4 mg via ORAL
  Filled 2020-10-30: qty 1

## 2020-10-30 MED ORDER — LORAZEPAM 1 MG PO TABS
0.0000 mg | ORAL_TABLET | Freq: Four times a day (QID) | ORAL | Status: DC
  Administered 2020-10-30: 1 mg via ORAL
  Filled 2020-10-30: qty 1
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
## 2020-10-30 NOTE — ED Provider Notes (Signed)
Emergency Medicine Provider Triage Evaluation Note  [name] , a 35 y.o. male  was evaluated in triage.  Pt complains of substance use and SI. States for the last few months he has been drinking etoh and using benzos. He has felt suicidal for the majority of the time and has a plan to overdose on etoh or benzos to harm himself.   Last drink was this am. Last benzo was last night. He has had some vomiting  Review of Systems  Positive: Si, vomiting Negative: Hi, avh  Physical Exam  BP 138/88 (BP Location: Right Arm)   Pulse 63   Temp 97.7 F (36.5 C) (Oral)   Resp 16   SpO2 100%  Gen:   Awake, no distress   Resp:  Normal effort  MSK:   Moves extremities without difficulty  Other:  Flat affect, si  Medical Decision Making  Medically screening exam initiated at 2:01 PM.  Appropriate orders placed.  Imanol Bihl was informed that the remainder of the evaluation will be completed by another provider, this initial triage assessment does not replace that evaluation, and the importance of remaining in the ED until their evaluation is complete.    Karrie Meres, PA-C 10/30/20 1401    Melene Plan, DO 10/30/20 1412

## 2020-10-30 NOTE — ED Provider Notes (Signed)
Behavioral Health Urgent Care Medical Screening Exam  Patient Name: Eric Keller MRN: [id number] Date of Evaluation: 10/30/20 Chief Complaint: Chief Complaint/Presenting Problem: SI. Diagnosis:  Final diagnoses:  None    History of Present illness: Eric Keller is a 35 y.o. male w/ hx of EtoH use disorder, Benzo use disorder, MDD, and GAD.  Patient presented to [facility] with concerns for SI which he continues to endorse with the plan to OD on Benzo's. Patient reports he has been waking up wishing he was dead the last 2 months. Patient reports that he has been comploiant with his Lexapro and his Gabapentin. Patient reports that prior to his relapse his Lexapro appeared to help a bit. Patient reports that he relapsed with EtOH use and Benzo use approx 3 months ago. Patient has also been smoking THC daily. Patient reports that he started a new job 4 mon ago and there was some additional stress. Patient reports that he is drinking "a fifth of liquor daily" and taking 4-8mg  of Xanax or Klonopin daily. Patient reports that he has gone through withdrawal before and has had seizures with withdrawal from benzo's. Patient EMR confirms seizures with withdrawal 07/2019. Patient denies HI and AVH at this time but report he will have AH when he is going through withdrawal.   Patient reports that he lives with his dad and they have a decent relationship, but dad would also like for him to get detox. Patient also reports FH: Sister w/ depression and anxiety on Prozac, Maternal Uncle died by suicide. Paternal Uncle has EtOH use disorder and possibly Bipolar. Psychiatric Specialty Exam  Presentation  General Appearance:Appropriate for Environment  Eye Contact:Fair  Speech:Clear and Coherent  Speech Volume:Normal  Handedness:-- (Defer)   Mood and Affect  Mood:Dysphoric  Affect:Depressed   Thought Process  Thought Processes:Coherent  Descriptions of Associations:Intact  Orientation:Full (Time, Place  and Person)  Thought Content:Logical  Diagnosis of Schizophrenia or Schizoaffective disorder in past: No   Hallucinations:None  Ideas of Reference:None  Suicidal Thoughts:Yes, Active With Plan; With Access to Means  Homicidal Thoughts:No   Sensorium  Memory:Immediate Good; Recent Good; Remote Good  Judgment:Fair  Insight:Fair   Executive Functions  Concentration:Good  Attention Span:Good  Recall:Good  Fund of Knowledge:Good  Language:Good   Psychomotor Activity  Psychomotor Activity:Normal   Assets  Assets:Housing; Desire for Improvement; Communication Skills; Resilience; Social Support   Sleep  Sleep:Poor  Number of hours: No data recorded  No data recorded  Physical Exam: Physical Exam Constitutional:      Appearance: Normal appearance.  HENT:     Head: Normocephalic and atraumatic.     Nose: Nose normal.  Eyes:     Extraocular Movements: Extraocular movements intact.     Pupils: Pupils are equal, round, and reactive to light.  Cardiovascular:     Rate and Rhythm: Normal rate.     Pulses: Normal pulses.  Pulmonary:     Effort: Pulmonary effort is normal.  Musculoskeletal:        General: Normal range of motion.     Comments: Patient does have a tremor in is RUE  Skin:    General: Skin is warm and dry.  Neurological:     General: No focal deficit present.     Mental Status: He is alert.    Review of Systems  Constitutional: Negative for chills and fever.  HENT: Negative for hearing loss.   Eyes: Negative for blurred vision.  Respiratory: Negative for cough and wheezing.   Cardiovascular:  Negative for chest pain.  Gastrointestinal: Negative for abdominal pain.  Neurological: Negative for dizziness.  Psychiatric/Behavioral: Positive for depression, substance abuse and suicidal ideas.   Blood pressure (!) 133/94, pulse 71, temperature 97.7 F (36.5 C), temperature source Tympanic, SpO2 100 %. There is no height or weight on file to  calculate BMI.  Musculoskeletal: Strength & Muscle Tone: within normal limits Gait & Station: normal Patient leans: N/A   [facility] MSE Discharge Disposition for Follow up and Recommendations: Patient presents looking to detox from Benzodiazepines and EtoH; however his hx is concerning for seizures with detox from Benzo's last 07/2019. patient will recall higher level of care than can be provided in an urgent care situation.  Psychiatry will need to reassess in the AM as patient is still reporting SI and remains interested in a detox program. If patient is medically cleared he can return to [facility] or go to inpatient hospitalization if he has already been approved.  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  - Recommend Detox Protocol - Recommend continue home Lexapro  - can continue Gabapentin if Kidney function is WNL  PGY-1 Bobbye Morton, MD 10/30/2020, 12:28 PM

## 2020-10-30 NOTE — Discharge Instructions (Addendum)
Patient is instructed to take all prescribed medications as recommended. Report any side effects or adverse reactions to your outpatient psychiatrist. Patient is instructed to abstain from alcohol and illegal drugs while on prescription medications. In the event of worsening symptoms, patient is instructed to call the crisis hotline, 911, or go to the nearest emergency department for evaluation and treatment.   

## 2020-10-30 NOTE — ED Provider Notes (Signed)
[facility] EMERGENCY DEPARTMENT Provider Note   CSN: [id number] Arrival date & time: 10/30/20  1336     History Chief Complaint  Patient presents with  . Alcohol Problem  . Suicidal    Eric Keller is a 35 y.o. male with PMH/o ETOH abuse, Benzo use, anxiety, hypertension, withdrawal seizure who presents for evaluation of suicidal ideation.  Patient patient initially presented to Behavioral health urgent care earlier today for evaluation of suicidal ideations.  He reports that over the last several weeks, he has been more depressed, more anxious.  He attributes this to new job that he recently started that has increased stressors.  He states that he started waking up wishing that he was dead.  He states that over the last several weeks, the suicidal ideations have intensified.  He states that he thought of drinking enough alcohol and taking enough benzos to kill himself.  He went into behavioral health urgent care today and was evaluated but given his history of alcohol withdrawal seizures was sent to the ER for further evaluation.  He endorses drinking about 1/5 of liquor a day.  Last use was earlier this morning.  He takes between 4 and 8 mg of Xanax or Klonopin daily.  Last use was last night.  States he has been hearing music and other sounds.  No visual hallucinations.  No HI.  He does report that he has had some intermittent nausea/vomiting as well as some abdominal pain.  Also endorses marijuana use.  The history is provided by the patient.       Past Medical History:  Diagnosis Date  . Alcohol withdrawal (HCC) 07/20/2019  . Anxiety   . Anxiety   . Depression   . H/O: substance abuse (HCC)   . Hypertension     Patient Active Problem List   Diagnosis Date Noted  . Alcohol withdrawal (HCC) 10/30/2020  . Major depressive disorder, recurrent episode, moderate (HCC) 08/08/2020  . Polysubstance dependence (HCC) 10/03/2019  . MDD (major depressive disorder),  recurrent severe, without psychosis (HCC) 10/02/2019  . 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 Benzodiazepine withdrawal without complication (HCC) 07/22/2019  . Alcohol dependence with uncomplicated withdrawal (HCC) 07/19/2019  . LFT elevation   . Essential hypertension, benign 05/11/2018  . Flushing 05/11/2018  . Nonintractable headache 05/11/2018  . Erythrocytosis 05/11/2018  . Anxiety 04/14/2011  . Depression 04/14/2011    Past Surgical History:  Procedure Laterality Date  . WISDOM TOOTH EXTRACTION         Family History  Problem Relation Age of Onset  . Cancer Mother   . Hypertension Paternal Grandfather   . Heart disease Paternal Grandfather        MIs  . Heart disease Other   . Stroke Other     Social History   Tobacco Use  . Smoking status: Never Smoker  . Smokeless tobacco: Never Used  Vaping Use  . Vaping Use: Never used  Substance Use Topics  . Alcohol use: Yes    Comment: daily 5th of liquor  . Drug use: Not Currently    Types: Marijuana    Comment: Vicodin Abuse, BEnzos    Home Medications Prior to Admission medications   Medication Sig Start Date End Date Taking? Authorizing Provider  acetaminophen (TYLENOL) 500 MG tablet Take 500 mg by mouth every 8 (eight) hours as needed for headache.   Yes [provider]  cloNIDine (CATAPRES) 0.1 MG  tablet TAKE 1 TABLET (0.1 MG TOTAL) BY MOUTH 3 (THREE) TIMES DAILY. Patient taking differently: Take 0.1 mg by mouth 3 (three) times daily. 09/18/20 09/18/21 Yes Hagler, Arlys JohnBrian, MD  diphenhydrAMINE (BENADRYL) 25 MG tablet Take 25 mg by mouth at bedtime as needed for allergies.   Yes [provider]  escitalopram (LEXAPRO) 10 MG tablet TAKE 2 TABLETS (20 MG TOTAL) BY MOUTH DAILY. Patient taking differently: Take 20 mg by mouth daily. 09/08/20 09/08/21 Yes Lord, Herminio HeadsJamison Y, NP  gabapentin (NEURONTIN) 400 MG capsule TAKE 1 CAPSULE (400 MG  TOTAL) BY MOUTH 3 (THREE) TIMES DAILY. Patient taking differently: Take 400 mg by mouth 3 (three) times daily. 09/08/20 09/08/21 Yes Lord, Herminio HeadsJamison Y, NP  metoprolol tartrate (LOPRESSOR) 50 MG tablet TAKE 1 TABLET (50 MG TOTAL) BY MOUTH 2 (TWO) TIMES DAILY. Patient taking differently: Take 50 mg by mouth 2 (two) times daily. 09/05/20 09/05/21 Yes Arvilla MarketWallace, Catherine Lauren, DO  QUEtiapine (SEROQUEL) 25 MG tablet TAKE 1 TABLET (25 MG TOTAL) BY MOUTH AT BEDTIME. Patient taking differently: Take 25 mg by mouth at bedtime. 09/08/20 09/08/21 Yes Charm RingsLord, Jamison Y, NP  traZODone (DESYREL) 100 MG tablet Take 1 tablet (100 mg total) by mouth at bedtime as needed for sleep. 05/27/20 09/08/20  Arvilla MarketWallace, Catherine Lauren, DO    Allergies    Patient has no known allergies.  Review of Systems   Review of Systems  Unable to perform ROS: Psychiatric disorder    Physical Exam Updated Vital Signs BP 133/82   Pulse (!) 55   Temp 98.8 F (37.1 C) (Oral)   Resp 15   SpO2 98%   Physical Exam Vitals and nursing note reviewed.  Constitutional:      Appearance: Normal appearance. He is well-developed.  HENT:     Head: Normocephalic and atraumatic.  Eyes:     General: Lids are normal.     Conjunctiva/sclera: Conjunctivae normal.     Pupils: Pupils are equal, round, and reactive to light.  Cardiovascular:     Rate and Rhythm: Normal rate and regular rhythm.     Pulses: Normal pulses.     Heart sounds: Normal heart sounds. No murmur heard. No friction rub. No gallop.   Pulmonary:     Effort: Pulmonary effort is normal.     Breath sounds: Normal breath sounds.     Comments: Lungs clear to auscultation bilaterally.  Symmetric chest rise.  No wheezing, rales, rhonchi. Abdominal:     Palpations: Abdomen is soft. Abdomen is not rigid.     Tenderness: There is abdominal tenderness. There is no guarding.     Comments: Abdomen is soft, non-distended. Slight tenderness noted diffusely. No rigidity, No guarding. No  peritoneal signs.  Musculoskeletal:        General: Normal range of motion.     Cervical back: Full passive range of motion without pain.  Skin:    General: Skin is warm and dry.     Capillary Refill: Capillary refill takes less than 2 seconds.  Neurological:     Mental Status: He is alert and oriented to person, place, and time.     Comments: Slight tremor noted  Psychiatric:        Mood and Affect: Mood is depressed.        Speech: Speech normal.        Thought Content: Thought content includes suicidal ideation. Thought content does not include homicidal ideation. Thought content includes suicidal plan. Thought content does not include  homicidal plan.     ED Results / Procedures / Treatments   Labs (all labs ordered are listed, but only abnormal results are displayed) Labs Reviewed  ETHANOL - Abnormal; Notable for the following components:      Result Value   Alcohol, Ethyl (B) 18 (*)    All other components within normal limits  SALICYLATE LEVEL - Abnormal; Notable for the following components:   Salicylate Lvl <7.0 (*)    All other components within normal limits  ACETAMINOPHEN LEVEL - Abnormal; Notable for the following components:   Acetaminophen (Tylenol), Serum <10 (*)    All other components within normal limits  RAPID URINE DRUG SCREEN, HOSP PERFORMED - Abnormal; Notable for the following components:   Tetrahydrocannabinol POSITIVE (*)    All other components within normal limits  RESP PANEL BY RT-PCR (FLU A&B, COVID) [id number]  COMPREHENSIVE METABOLIC PANEL  CBC WITH DIFFERENTIAL/PLATELET  LIPASE, BLOOD    EKG EKG Interpretation  Date/Time:  Thursday Oct 30 2020 20:45:44 EDT Ventricular Rate:  69 PR Interval:  148 QRS Duration: 104 QT Interval:  434 QTC Calculation: 465 R Axis:   82 Text Interpretation: Sinus rhythm Borderline T wave abnormalities Confirmed by Alona Bene [phone]) on 10/30/2020 9:16:50 PM   Radiology No results  found.  Procedures .Critical Care Performed by: Maxwell Caul, PA-C Authorized by: Maxwell Caul, PA-C   Critical care provider statement:    Critical care time (minutes):  35   Critical care was necessary to treat or prevent imminent or life-threatening deterioration of the following conditions: ETOH Withdrawal.   Critical care was time spent personally by me on the following activities:  Discussions with consultants, evaluation of patient's response to treatment, examination of patient, ordering and performing treatments and interventions, ordering and review of laboratory studies, ordering and review of radiographic studies, pulse oximetry, re-evaluation of patient's condition, obtaining history from patient or surrogate and review of old charts     Medications Ordered in ED Medications  LORazepam (ATIVAN) injection 0-4 mg (2 mg Intravenous Given 10/30/20 1745)    Or  LORazepam (ATIVAN) tablet 0-4 mg ( Oral See Alternative 10/30/20 1745)  LORazepam (ATIVAN) injection 0-4 mg (has no administration in time range)    Or  LORazepam (ATIVAN) tablet 0-4 mg (has no administration in time range)  thiamine tablet 100 mg ( Oral See Alternative 10/30/20 1745)    Or  thiamine (B-1) injection 100 mg (100 mg Intravenous Given 10/30/20 1745)  ondansetron (ZOFRAN-ODT) disintegrating tablet 4 mg (4 mg Oral Given 10/30/20 1745)    ED Course  I have reviewed the triage vital signs and the nursing notes.  Pertinent labs & imaging results that were available during my care of the patient were reviewed by me and considered in my medical decision making (see chart for details).    MDM Rules/Calculators/A&P                          35 year old male who presents for evaluation of suicidal ideations.  States that this is been ongoing for the last few weeks.  Reports daily Xanax, alcohol use.  Last alcohol use was earlier this morning.  Last Xanax was last night.  Initially presented to behavioral  health urgent care but given his history of withdrawal seizures was sent to the ED for further evaluation.  On initial arrival, he is afebrile, is slightly tremulous but vital stable.  Patient started on CIWA protocol.  Labs ordered in triage.   Given his hallucinations, nausea/vomiting, headache, anxiety, he has a CIWA score of 20.  Patient given Ativan, fluids.  Salicylate, acetaminophen level unremarkable.  Ethanol is 18.  Lipase normal.  CBC shows no leukocytosis or anemia.  CMP is largely normal.  At this time, given his high CIWA score, history of alcohol withdrawal seizures, feel that he needs observation and medication management before being medically cleared for psych.   Discussed patient with Dr. Loney Loh (hospitalst) who accepts patient for admission.   Portions of this note were generated with Scientist, clinical (histocompatibility and immunogenetics). Dictation errors may occur despite best attempts at proofreading.   Final Clinical Impression(s) / ED Diagnoses Final diagnoses:  Alcohol withdrawal syndrome with complication (HCC)  Benzodiazepine withdrawal with complication [facility])    Rx / DC Orders ED Discharge Orders    None       Rosana Hoes 10/30/20 2136    Maia Plan, MD 11/03/20 [phone]

## 2020-10-30 NOTE — BH Assessment (Signed)
TTS triage: Patient presents to [facility] reporting significant depression for the past 2 months and suicidal ideation. Patient states "I see a therapist and a psychiatrist here regularly. The past 2 months I have been out of control drinking and using benzos. I don't want to be alive anymore." Patient states he has thoughts of over using to end his life. He denies HI/AVH. He states that he does experience AH when detoxing. Patient reports a history of medical admission for alcohol and benzodiazepine withdrawal. He states he and his father are fearful he will harm himself. He gives consent for TTS to speak with his father, Onalee Hua, at [phone].  Patient is EMERGENT.

## 2020-10-30 NOTE — ED Triage Notes (Signed)
Pt reports SI. etoh use and benzo use for several months. Last took klonopin last night and drank alcohol this am. Calm and cooperative at triage.

## 2020-10-31 ENCOUNTER — Telehealth (HOSPITAL_COMMUNITY): Payer: Self-pay | Admitting: Professional

## 2020-10-31 DIAGNOSIS — F10239 Alcohol dependence with withdrawal, unspecified: Secondary | ICD-10-CM

## 2020-10-31 DIAGNOSIS — R45851 Suicidal ideations: Secondary | ICD-10-CM

## 2020-10-31 DIAGNOSIS — R112 Nausea with vomiting, unspecified: Secondary | ICD-10-CM

## 2020-10-31 LAB — COMPREHENSIVE METABOLIC PANEL
ALT: 23 U/L (ref 0–44)
AST: 28 U/L (ref 15–41)
Albumin: 3.6 g/dL (ref 3.5–5.0)
Alkaline Phosphatase: 50 U/L (ref 38–126)
Anion gap: 7 (ref 5–15)
BUN: 12 mg/dL (ref 6–20)
CO2: 30 mmol/L (ref 22–32)
Calcium: 9 mg/dL (ref 8.9–10.3)
Chloride: 100 mmol/L (ref 98–111)
Creatinine, Ser: 1.27 mg/dL — ABNORMAL HIGH (ref 0.61–1.24)
GFR, Estimated: >60 mL/min (ref >60)
Glucose, Bld: 89 mg/dL (ref 70–99)
Potassium: 4 mmol/L (ref 3.5–5.1)
Sodium: 137 mmol/L (ref 135–145)
Total Bilirubin: 1.5 mg/dL — ABNORMAL HIGH (ref 0.3–1.2)
Total Protein: 6.2 g/dL — ABNORMAL LOW (ref 6.5–8.1)

## 2020-10-31 LAB — CBC
HCT: 47.2 % (ref 39.0–52.0)
Hemoglobin: 16 g/dL (ref 13.0–17.0)
MCH: 33.5 pg (ref 26.0–34.0)
MCHC: 33.9 g/dL (ref 30.0–36.0)
MCV: 98.7 fL (ref 80.0–100.0)
Platelets: 224 10*3/uL (ref 150–400)
RBC: 4.78 MIL/uL (ref 4.22–5.81)
RDW: 13.4 % (ref 11.5–15.5)
WBC: 7.3 10*3/uL (ref 4.0–10.5)
nRBC: 0 % (ref 0.0–0.2)

## 2020-10-31 LAB — HIV ANTIBODY (ROUTINE TESTING W REFLEX): HIV Screen 4th Generation wRfx: NONREACTIVE

## 2020-10-31 LAB — MAGNESIUM: Magnesium: 2.2 mg/dL (ref 1.7–2.4)

## 2020-10-31 LAB — PHOSPHORUS: Phosphorus: 3 mg/dL (ref 2.5–4.6)

## 2020-10-31 MED ORDER — ESCITALOPRAM OXALATE 20 MG PO TABS
20.0000 mg | ORAL_TABLET | Freq: Every day | ORAL | Status: DC
  Administered 2020-10-31 – 2020-11-04 (×5): 20 mg via ORAL
  Filled 2020-10-31 (×5): qty 1

## 2020-10-31 MED ORDER — ENSURE ENLIVE PO LIQD
237.0000 mL | Freq: Two times a day (BID) | ORAL | Status: DC
  Administered 2020-11-01 – 2020-11-03 (×6): 237 mL via ORAL

## 2020-10-31 MED ORDER — ACETAMINOPHEN 650 MG RE SUPP: 650.0000 mg | Freq: Four times a day (QID) | RECTAL | Status: DC | PRN

## 2020-10-31 MED ORDER — ENOXAPARIN SODIUM 40 MG/0.4ML IJ SOSY
40.0000 mg | PREFILLED_SYRINGE | INTRAMUSCULAR | Status: DC
  Administered 2020-10-31 – 2020-11-03 (×4): 40 mg via SUBCUTANEOUS
  Filled 2020-10-31 (×4): qty 0.4

## 2020-10-31 MED ORDER — ONDANSETRON HCL 4 MG/2ML IJ SOLN
4.0000 mg | Freq: Four times a day (QID) | INTRAMUSCULAR | Status: DC | PRN
  Administered 2020-11-01 – 2020-11-04 (×4): 4 mg via INTRAVENOUS
  Filled 2020-10-31 (×4): qty 2

## 2020-10-31 MED ORDER — THIAMINE HCL 100 MG/ML IJ SOLN: 100.0000 mg | Freq: Every day | INTRAMUSCULAR | Status: DC

## 2020-10-31 MED ORDER — CLONIDINE HCL 0.1 MG PO TABS
0.1000 mg | ORAL_TABLET | Freq: Three times a day (TID) | ORAL | Status: DC
  Administered 2020-10-31 – 2020-11-04 (×13): 0.1 mg via ORAL
  Filled 2020-10-31 (×13): qty 1

## 2020-10-31 MED ORDER — LORAZEPAM 2 MG/ML IJ SOLN
1.0000 mg | INTRAMUSCULAR | Status: DC | PRN
  Administered 2020-10-31: 2 mg via INTRAVENOUS
  Administered 2020-10-31: 1 mg via INTRAVENOUS
  Filled 2020-10-31 (×2): qty 1

## 2020-10-31 MED ORDER — ACETAMINOPHEN 325 MG PO TABS
650.0000 mg | ORAL_TABLET | Freq: Four times a day (QID) | ORAL | Status: DC | PRN
  Administered 2020-10-31 – 2020-11-04 (×4): 650 mg via ORAL
  Filled 2020-10-31 (×4): qty 2

## 2020-10-31 MED ORDER — THIAMINE HCL 100 MG PO TABS
100.0000 mg | ORAL_TABLET | Freq: Every day | ORAL | Status: DC
  Administered 2020-10-31 – 2020-11-04 (×5): 100 mg via ORAL
  Filled 2020-10-31 (×5): qty 1

## 2020-10-31 MED ORDER — LORAZEPAM 1 MG PO TABS
1.0000 mg | ORAL_TABLET | ORAL | Status: AC | PRN
  Administered 2020-10-31: 3 mg via ORAL
  Administered 2020-10-31: 1 mg via ORAL
  Administered 2020-10-31: 3 mg via ORAL
  Administered 2020-11-01 (×2): 2 mg via ORAL
  Administered 2020-11-01: 1 mg via ORAL
  Administered 2020-11-01: 2 mg via ORAL
  Administered 2020-11-01: 3 mg via ORAL
  Administered 2020-11-01: 1 mg via ORAL
  Administered 2020-11-02 (×2): 2 mg via ORAL
  Administered 2020-11-02 (×2): 1 mg via ORAL
  Filled 2020-10-31: qty 1
  Filled 2020-10-31: qty 3
  Filled 2020-10-31: qty 2
  Filled 2020-10-31 (×2): qty 1
  Filled 2020-10-31: qty 3
  Filled 2020-10-31: qty 2
  Filled 2020-10-31: qty 1
  Filled 2020-10-31: qty 2
  Filled 2020-10-31: qty 3
  Filled 2020-10-31: qty 1
  Filled 2020-10-31 (×2): qty 2

## 2020-10-31 MED ORDER — TRAZODONE HCL 50 MG PO TABS
50.0000 mg | ORAL_TABLET | Freq: Every evening | ORAL | Status: DC | PRN
  Administered 2020-10-31 – 2020-11-02 (×2): 50 mg via ORAL
  Filled 2020-10-31 (×2): qty 1

## 2020-10-31 MED ORDER — GABAPENTIN 400 MG PO CAPS
400.0000 mg | ORAL_CAPSULE | Freq: Three times a day (TID) | ORAL | Status: DC
  Administered 2020-10-31 – 2020-11-04 (×13): 400 mg via ORAL
  Filled 2020-10-31 (×13): qty 1

## 2020-10-31 MED ORDER — ADULT MULTIVITAMIN W/MINERALS CH
1.0000 | ORAL_TABLET | Freq: Every day | ORAL | Status: DC
  Administered 2020-10-31 – 2020-11-04 (×5): 1 via ORAL
  Filled 2020-10-31 (×5): qty 1

## 2020-10-31 MED ORDER — FOLIC ACID 1 MG PO TABS
1.0000 mg | ORAL_TABLET | Freq: Every day | ORAL | Status: DC
  Administered 2020-10-31 – 2020-11-04 (×5): 1 mg via ORAL
  Filled 2020-10-31 (×5): qty 1

## 2020-10-31 MED ORDER — THIAMINE HCL 100 MG/ML IJ SOLN
Freq: Once | INTRAVENOUS | Status: AC
  Filled 2020-10-31: qty 1000

## 2020-10-31 NOTE — ED Notes (Signed)
Attempted report to inpatient floor x 1 

## 2020-10-31 NOTE — TOC CAGE-AID Note (Signed)
Transition of Care [facility]) - CAGE-AID Screening   Patient Details  Name: Eric Keller MRN: [id number] Date of Birth: 07/28/1985  Transition of Care [facility]) CM/SW Contact:    Joanne Chars, LCSW Phone Number: 10/31/2020, 1:55 PM   Clinical Narrative: CSW met with pt to complete Cage Aid.  Pt reports daily alcohol use, between a pin and a fifth for the past 6 months. Pt also reports daily use of benzos, xanax and klonopin, 4-6 mg per day.  Pt also reports THC use almost daily.  Pt is currently attending monthly virtual individual sessions through [facility]).  He missed appt last week.  He has been to residential treatment in 2021.  Pt had 4 years sobriety from 2015 -2019, reports he was involved in church at the time, spending time with supportive friends.  He is working currently and would be interested in higher level of care on outpt basis so he can keep his job.  Permission given to CSW to reach out to [facility] to pursue admission to [facility].  Pt engaged in discussion.    CAGE-AID Screening:    Have You Ever Felt You Ought to Cut Down on Your Drinking or Drug Use?: Yes Have People Annoyed You By Critizing Your Drinking Or Drug Use?: Yes Have You Felt Bad Or Guilty About Your Drinking Or Drug Use?: Yes Have You Ever Had a Drink or Used Drugs First Thing In The Morning to Steady Your Nerves or to Get Rid of a Hangover?: Yes CAGE-AID Score: 4  Substance Abuse Education Offered: Yes  Substance abuse interventions: Patient Counseling

## 2020-10-31 NOTE — H&P (Addendum)
History and Physical    Eric Keller HAL:[id number] DOB: 1985-10-24 DOA: 10/30/2020  PCP: Arvilla Market, DO Patient coming from: Home  Chief Complaint: Suicidal ideation  HPI: Ikechukwu Cerny is a 35 y.o. male with medical history significant of depression, anxiety, alcohol use disorder, hypertension presented to behavioral health urgent care reporting significant depression for the past 2 months and suicidal ideation.  He reported thoughts of overusing benzos to end his life.  He was sent to the ED due to concern for alcohol withdrawal with prior history of withdrawal seizures.  In the ED, patient was having hallucinations, nausea/vomiting, headache, and anxiety with CIWA score of 20.  He was given Ativan.  Labs showing WBC 7.0, hemoglobin 16.5, platelet count 278K.  Sodium 137, potassium 4.3, chloride 100, bicarb 30, BUN 12, creatinine 1.1, glucose 88.  Lipase and LFTs normal.  Screening COVID and influenza PCR negative.  Blood ethanol level 18.  Acetaminophen and salicylate levels undetectable.  UDS positive for THC.  Patient states he is here to get help as he has been drinking heavily for the past few months and taking benzos.  He is having thoughts of ending his life by overdosing on alcohol and benzos.  He is taking multiple tablets of Xanax and Klonopin per day which were not prescribed to him by his doctor.  He drinks 1/5 of liquor daily and his last drink was yesterday morning.  He is feeling very anxious and describing a bug crawling sensation under his skin.  States he has had seizures related to alcohol withdrawal in the past.  Also reports intermittent episodes of vomiting over the past month which he thinks might be due to him drinking excessively.  Review of Systems:  All systems reviewed and apart from history of presenting illness, are negative.  Past Medical History:  Diagnosis Date  . Alcohol withdrawal (HCC) 07/20/2019  . Anxiety   . Anxiety   . Depression   .  H/O: substance abuse (HCC)   . Hypertension     Past Surgical History:  Procedure Laterality Date  . WISDOM TOOTH EXTRACTION       reports that he has never smoked. He has never used smokeless tobacco. He reports current alcohol use. He reports previous drug use. Drug: Marijuana.  No Known Allergies  Family History  Problem Relation Age of Onset  . Cancer Mother   . Hypertension Paternal Grandfather   . Heart disease Paternal Grandfather        MIs  . Heart disease Other   . Stroke Other     Prior to Admission medications   Medication Sig Start Date End Date Taking? Authorizing Provider  acetaminophen (TYLENOL) 500 MG tablet Take 500 mg by mouth every 8 (eight) hours as needed for headache.   Yes [provider]  cloNIDine (CATAPRES) 0.1 MG tablet TAKE 1 TABLET (0.1 MG TOTAL) BY MOUTH 3 (THREE) TIMES DAILY. Patient taking differently: Take 0.1 mg by mouth 3 (three) times daily. 09/18/20 09/18/21 Yes Hagler, Arlys John, MD  diphenhydrAMINE (BENADRYL) 25 MG tablet Take 25 mg by mouth at bedtime as needed for allergies.   Yes [provider]  escitalopram (LEXAPRO) 10 MG tablet TAKE 2 TABLETS (20 MG TOTAL) BY MOUTH DAILY. Patient taking differently: Take 20 mg by mouth daily. 09/08/20 09/08/21 Yes Lord, Herminio Heads, NP  gabapentin (NEURONTIN) 400 MG capsule TAKE 1 CAPSULE (400 MG TOTAL) BY MOUTH 3 (THREE) TIMES DAILY. Patient taking differently: Take 400 mg by mouth 3 (  three) times daily. 09/08/20 09/08/21 Yes Lord, Herminio Heads, NP  metoprolol tartrate (LOPRESSOR) 50 MG tablet TAKE 1 TABLET (50 MG TOTAL) BY MOUTH 2 (TWO) TIMES DAILY. Patient taking differently: Take 50 mg by mouth 2 (two) times daily. 09/05/20 09/05/21 Yes Arvilla Market, DO  QUEtiapine (SEROQUEL) 25 MG tablet TAKE 1 TABLET (25 MG TOTAL) BY MOUTH AT BEDTIME. Patient taking differently: Take 25 mg by mouth at bedtime. 09/08/20 09/08/21 Yes Charm Rings, NP  traZODone (DESYREL) 100 MG tablet Take 1 tablet  (100 mg total) by mouth at bedtime as needed for sleep. 05/27/20 09/08/20  Arvilla Market, DO    Physical Exam: Vitals:   10/31/20 0515 10/31/20 0606 10/31/20 0622 10/31/20 0732  BP:  121/80  (!) 160/90  Pulse: 70 82  77  Resp: 17 11  11   Temp:   97.8 F (36.6 C) 98.2 F (36.8 C)  TempSrc:   Oral Oral  SpO2: 100% 99%  100%    Physical Exam Constitutional:      General: He is not in acute distress.    Comments: Tremulous  HENT:     Head: Normocephalic and atraumatic.  Eyes:     Extraocular Movements: Extraocular movements intact.     Conjunctiva/sclera: Conjunctivae normal.  Cardiovascular:     Rate and Rhythm: Normal rate and regular rhythm.     Pulses: Normal pulses.  Pulmonary:     Effort: Pulmonary effort is normal. No respiratory distress.     Breath sounds: Normal breath sounds. No wheezing or rales.  Abdominal:     General: Bowel sounds are normal. There is no distension.     Palpations: Abdomen is soft.     Tenderness: There is no abdominal tenderness. There is no guarding or rebound.  Musculoskeletal:        General: No swelling or tenderness.     Cervical back: Normal range of motion and neck supple.  Skin:    General: Skin is warm and dry.  Neurological:     General: No focal deficit present.     Mental Status: He is alert and oriented to person, place, and time.  Psychiatric:     Comments: Anxious     Labs on Admission: I have personally reviewed following labs and imaging studies  CBC: Recent Labs  Lab 10/30/20 1414 10/31/20 0415  WBC 7.0 7.3  NEUTROABS 3.6  --   HGB 16.5 16.0  HCT 49.4 47.2  MCV 100.0 98.7  PLT 278 224   Basic Metabolic Panel: Recent Labs  Lab 10/30/20 1414 10/31/20 0415  NA 137 137  K 4.3 4.0  CL 100 100  CO2 30 30  GLUCOSE 88 89  BUN 12 12  CREATININE 1.17 1.27*  CALCIUM 9.0 9.0  MG  --  2.2  PHOS  --  3.0   GFR: CrCl cannot be calculated (Unknown ideal weight.). Liver Function Tests: Recent Labs   Lab 10/30/20 1414 10/31/20 0415  AST 30 28  ALT 28 23  ALKPHOS 55 50  BILITOT 1.0 1.5*  PROT 7.0 6.2*  ALBUMIN 4.0 3.6   Recent Labs  Lab 10/30/20 1414  LIPASE 30   No results for input(s): AMMONIA in the last 168 hours. Coagulation Profile: No results for input(s): INR, PROTIME in the last 168 hours. Cardiac Enzymes: No results for input(s): CKTOTAL, CKMB, CKMBINDEX, TROPONINI in the last 168 hours. BNP (last 3 results) No results for input(s): PROBNP in the last 8760 hours. HbA1C:  No results for input(s): HGBA1C in the last 72 hours. CBG: No results for input(s): GLUCAP in the last 168 hours. Lipid Profile: No results for input(s): CHOL, HDL, LDLCALC, TRIG, CHOLHDL, LDLDIRECT in the last 72 hours. Thyroid Function Tests: No results for input(s): TSH, T4TOTAL, FREET4, T3FREE, THYROIDAB in the last 72 hours. Anemia Panel: No results for input(s): VITAMINB12, FOLATE, FERRITIN, TIBC, IRON, RETICCTPCT in the last 72 hours. Urine analysis:    Component Value Date/Time   COLORURINE YELLOW 10/02/2019 0019   APPEARANCEUR CLEAR 10/02/2019 0019   APPEARANCEUR Clear 12/10/2011 1538   LABSPEC 1.009 10/02/2019 0019   LABSPEC 1.004 12/10/2011 1538   PHURINE 6.0 10/02/2019 0019   GLUCOSEU NEGATIVE 10/02/2019 0019   GLUCOSEU Negative 12/10/2011 1538   HGBUR NEGATIVE 10/02/2019 0019   BILIRUBINUR NEGATIVE 10/02/2019 0019   BILIRUBINUR Negative 12/10/2011 1538   KETONESUR 5 (A) 10/02/2019 0019   PROTEINUR NEGATIVE 10/02/2019 0019   UROBILINOGEN 0.2 12/17/2018 1836   NITRITE NEGATIVE 10/02/2019 0019   LEUKOCYTESUR NEGATIVE 10/02/2019 0019   LEUKOCYTESUR Negative 12/10/2011 1538    Radiological Exams on Admission: No results found.  EKG: Independently reviewed.  Sinus rhythm, borderline T wave abnormalities.  No significant change since prior tracing.  Assessment/Plan Principal Problem:   Alcohol withdrawal (HCC) Active Problems:   Depression   Essential hypertension,  benign   Suicidal ideation   Nausea and vomiting   Alcohol withdrawal Drinks 1/5 of liquor daily.  Last drink was yesterday morning and currently undergoing withdrawal.  Most recent CIWA score 17.  Sent here from behavioral health for inpatient detox due to prior history of alcohol withdrawal seizures. -Admitted to progressive care unit with CIWA protocol; Ativan as needed.  Thiamine, folate, and multivitamin.  Continue to monitor very closely.  Depression with suicidal ideation -Psychiatry consulted, suicide precautions.  Continue home Lexapro per psychiatry recommendations.   Nausea, emesis Likely related to ethanol use.  Lipase and LFTs normal.  Abdominal exam benign.  No active emesis at this time. -Antiemetic as needed  Hypertension -Continue home clonidine  DVT prophylaxis: Lovenox Code Status: Full code Family Communication: No family available at this time. Disposition Plan: Status is: Inpatient  Remains inpatient appropriate because:Inpatient level of care appropriate due to severity of illness   Dispo: The patient is from: Home              Anticipated d/c is to: Behavioral health/inpatient psychiatry unit              Patient currently is not medically stable to d/c.   Difficult to place patient No  Level of care: Level of care: Progressive   The medical decision making on this patient was of high complexity and the patient is at high risk for clinical deterioration, therefore this is a level 3 visit.  John Giovanni MD [facility]  If 7PM-7AM, please contact night-coverage [email or web address]  10/31/2020, 7:53 AM

## 2020-10-31 NOTE — Consult Note (Signed)
  Patient seen and assessed by a therapeutic triage specialist, prior to being transferred to the emergency department for medical clearance.PLease see Comprehensive clinical assessment completed less than 24 hours ago.  Eric Keller is a 35 year old male with history of alcohol use disorder, benzodiazepine use disorder, major depression, and generalized anxiety disorder.  Psych consult was placed for suicidal ideation.  35 year old male with long history of polysubstance abuse and dependence, who has previously been stable until relapse about 2 months ago.  He endorses having passive suicidal ideations.  He recently started outpatient substance abuse therapy, in which he is seeing a psychiatrist and a therapist regularly.  He reports increase in his alcohol intake to include 1/5 of liquor a day.   Treatment plan summary: Patient still reports some anxiety, worries, depression, and apprehension.  He denies any psychosis, delusions, and or self harming thoughts.  He reports improvement in his suicidal ideations as it relates to alcohol metabolization.  He denies any current intent with the plan, suicidal ideations, and or thoughts of wishing he were dead.  Based on my assessment today, patient does not meet criteria for inpatient psychiatric admission, both he will benefit from referral for intensive outpatient substance abuse therapy at [facility].    Recommendations: -Continue Ativan alcohol detox protocol  -Continue Lexapro 20 mg p.o. daily for depression -Continue clonidine 0.1 mg p.o. 3 times daily for opiate detox -Continue gabapentin 400 mg p.o. 3 times daily for alcohol withdrawal and anxiety -Consider social worker consult to facilitate referral to [facility], [facility] and or [facility] for drug rehab upon discharge.  Disposition: No evidence of imminent risk to self or others at present.   Patient does not meet criteria for psychiatric inpatient admission. Supportive therapy  provided about ongoing stressors. Psychiatric service signing out. Re-consult as needed

## 2020-10-31 NOTE — Progress Notes (Signed)
PROGRESS NOTE  Brief Narrative: Eric Keller is a 35 y.o. male with a history of polysubstance abuse, MDD, GAD, and HTN who presented to [facility] urgent care for reports of suicidal ideation (thoughts of taking an overdose of benzodiazepines and alcohol to kill himself) and was sent to the ED for medical clearance due to anticipation of alcohol withdrawal (fifth of whiskey per day) with a history of withdrawal seizures. He required a significant amount of ativan in the ED, so admission was requested with psychiatry consult pending.  Subjective: Feeling a bit tremulous currently but much less anxious than on arrival. No vomiting but hasn't eaten much, thinks he's dehydrated. Still feels very depressed.  Objective: BP 127/85 (BP Location: Left Arm)   Pulse 75   Temp 98.4 F (36.9 C) (Oral)   Resp 15   SpO2 99%   Gen: Non toxic male Pulm: Clear and nonlabored on room air  CV: Regular rate and rhythm. Neuro: Alert and oriented. No focal deficits. Mild diffuse tremor noted. Psych: Calm, fair eye contact, interactive. +SI  Assessment & Plan: Principal Problem:   Alcohol withdrawal (HCC) Active Problems:   Depression   Essential hypertension, benign   Suicidal ideation   Nausea and vomiting  Alcohol abuse and withdrawal: Last EtOH 5/12 AM. Level was 18 on arrival to ED.  - Continue CIWA protocol and close monitoring in PCU.  - Give banana bag x1, continue thiamine, folate, MVM.   MDD, GAD, passive suicidal ideation:  - Psychiatry consulted, suicide precautions.  - Continue lexapro  HTN:  - Continue clonidine  Nausea, emesis: Lipase wnl.  - Monitor, prn antiemetics.   Otherwise, please see H&P from this morning by Dr. Loney Loh.  Tyrone Nine, MD Pager on amion 10/31/2020, 1:23 PM

## 2020-10-31 NOTE — Progress Notes (Signed)
Initial Nutrition Assessment  DOCUMENTATION CODES:   Not applicable  INTERVENTION:   -Ensure Enlive po BID, each supplement provides 350 kcal and 20 grams of protein -MVI with minerals daily  NUTRITION DIAGNOSIS:   Inadequate oral intake related to decreased appetite as evidenced by per patient/family report.  GOAL:   Patient will meet greater than or equal to 90% of their needs  MONITOR:   PO intake,Supplement acceptance,Labs,Weight trends,Skin,I & O's  REASON FOR ASSESSMENT:   Malnutrition Screening Tool    ASSESSMENT:   Eric Keller is a 35 y.o. male with medical history significant of depression, anxiety, alcohol use disorder, hypertension presented to behavioral health urgent care reporting significant depression for the past 2 months and suicidal ideation.  Pt admitted with alcohol withdrawal and depression with suicidal ideation.   Reviewed I/O's: -825 ml x 24 hours  UOP: 825 ml x 24 hours  Spoke with pt at bedside, who reports feeling a little better today. He shares that he has not felt well for about 3 months, secondary to depression. He shares that over the past 3 weeks, he has had nausea and vomiting with eating. Intake has been erratic over this time- per pt, he is sometimes able to keep down one meal per day and other times nothing at all. Pt reports he was able to consume 75% of his tilapia this afternoon.   Pt shares that he estimates he has lost about 15 pounds over the past 6 months. He is unsure of his UBW. Per pt, he also feels weak- routinely weight lifts and has not been able to lift as much.   Discussed importance of good meal and supplement intake to promote healing. Pt amenable to Ensure supplements.   Medications reviewed and include folvite, MVI, and thiamine.   Labs reviewed.   NUTRITION - FOCUSED PHYSICAL EXAM:  Flowsheet Row Most Recent Value  Orbital Region No depletion  Upper Arm Region No depletion  Thoracic and Lumbar Region No  depletion  Buccal Region No depletion  Temple Region No depletion  Clavicle Bone Region No depletion  Clavicle and Acromion Bone Region No depletion  Scapular Bone Region No depletion  Dorsal Hand No depletion  Patellar Region No depletion  Anterior Thigh Region No depletion  Posterior Calf Region No depletion  Edema (RD Assessment) None  Hair Reviewed  Eyes Reviewed  Mouth Reviewed  Skin Reviewed  Nails Reviewed       Diet Order:   Diet Order            Diet regular Room service appropriate? Yes; Fluid consistency: Thin  Diet effective now                 EDUCATION NEEDS:   Education needs have been addressed  Skin:  Skin Assessment: Reviewed RN Assessment  Last BM:  Unknown  Height:   Ht Readings from Last 1 Encounters:  06/13/20 6\' 2"  (1.88 m)    Weight:   Wt Readings from Last 1 Encounters:  06/13/20 99.8 kg    Ideal Body Weight:  86.4 kg  BMI:  There is no height or weight on file to calculate BMI.  Estimated Nutritional Needs:   Kcal:  2300-2500  Protein:  115-130 grams  Fluid:  > 2 L    06/15/20, RD, LDN, CDCES Registered Dietitian II Certified Diabetes Care and Education Specialist Please refer to [facility] for RD and/or RD on-call/weekend/after hours pager

## 2020-10-31 NOTE — TOC Initial Note (Addendum)
Transition of Care [facility]) - Initial/Assessment Note    Patient Details  Name: Eric Keller MRN: [id number] Date of Birth: [birth year]  [facility] of [facility]) CM/SW Contact:    Joanne Chars, LCSW Phone Number: 10/31/2020, 2:05 PM  Clinical Narrative:   CSW met with pt to complete C[facility] Aid.  See note.  Permission given to speak with pt father.  Pt currently receiving outpt virtual counseling once per month at [facility] [place], would like to increase substance use level of care.  CSW attempting to reach out to [facility].       Whitney Phoebe Sharps appears to be therapist--attempted to contact through epic message. CSW contacted the referral line and the appt line at [facility], [phone] option 6.  No answer.  Left message.    1530: No response from [place] called [facility], who offers evening IOP, spoke with pt again and gave option to talk with Whitney about referral to [facility] or to call [facility] about their IOP.  Pt verbalizes understanding.  1600: [facility] called back, their IOP is MWF 9am-12noon.  They will call pt to discuss and can set up intake appt if he wants to move forward.  Pt informed.               Expected Discharge Plan: Home/Self Care Barriers to Discharge: Continued Medical Work up   Patient Goals and CMS Choice Patient states their goals for this hospitalization and ongoing recovery are:: "get back to sobriety, get my mind back" CMS Medicare.gov Compare Post Acute Care list provided to::  (na)    Expected Discharge Plan and Services Expected Discharge Plan: Home/Self Care In-house Referral: Clinical Social Work   Post Acute Care Choice: NA Living arrangements for the past 2 months: Single Family Home                 DME Arranged: N/A           HH Agency: NA        Prior Living Arrangements/Services Living arrangements for the past 2 months: Single Family Home Lives with:: Parents (with father) Patient language and need for interpreter reviewed:: Yes         Need for Family Participation in Patient Care: No (Comment) Care giver support system in place?: Yes (comment) Current home services: Other (comment) (none) Criminal Activity/Legal Involvement Pertinent to Current Situation/Hospitalization: No - Comment as needed  Activities of Daily Living Home Assistive Devices/Equipment: None ADL Screening (condition at time of admission) Patient's cognitive ability adequate to safely complete daily activities?: Yes Is the patient deaf or have difficulty hearing?: Yes Does the patient have difficulty seeing, even when wearing glasses/contacts?: Yes (blurry now) Does the patient have difficulty concentrating, remembering, or making decisions?: Yes Patient able to express need for assistance with ADLs?: Yes Does the patient have difficulty dressing or bathing?: Yes Independently performs ADLs?: Yes (appropriate for developmental age) Does the patient have difficulty walking or climbing stairs?: No Weakness of Legs: None Weakness of Arms/Hands: None  Permission Sought/Granted Permission sought to share information with : Family Supports,Other (comment) Permission granted to share information with : Yes, Verbal Permission Granted  Share Information with NAME: father Shanon Brow  Permission granted to share info w AGENCY: [facility]        Emotional Assessment Appearance:: Appears stated age Attitude/Demeanor/Rapport: Engaged Affect (typically observed): Appropriate,Pleasant Orientation: : Oriented to Self,Oriented to Place,Oriented to  Time,Oriented to Situation Alcohol / Substance Use: Alcohol Use,Illicit Drugs Psych Involvement: Yes (  comment)  Admission diagnosis:  Alcohol withdrawal ([place]) [F10.239] Alcohol withdrawal syndrome with complication ([place]) [M76.720] Benzodiazepine withdrawal with complication [facility]) [N47.096] Patient Active Problem List   Diagnosis Date Noted  . Suicidal ideation 10/31/2020  . Nausea and vomiting 10/31/2020  . Alcohol  withdrawal ([place]) 10/30/2020  . Major depressive disorder, recurrent episode, moderate ([place]) 08/08/2020  . Polysubstance dependence ([place]) 10/03/2019  . MDD (major depressive disorder), recurrent severe, without psychosis ([place]) 10/02/2019  . Polysubstance dependence including opioid type drug with complication, continuous use ([place]) 07/22/2019  . Benzodiazepine withdrawal without complication ([place]) 28/36/6294  . Alcohol dependence with uncomplicated withdrawal ([place]) 07/19/2019  . LFT elevation   . Essential hypertension, benign 05/11/2018  . [place] 05/11/2018  . Nonintractable headache 05/11/2018  . Erythrocytosis 05/11/2018  . Anxiety 04/14/2011  . Depression 04/14/2011   PCP:  Nicolette Bang, DO Pharmacy:   [facility] [address] [place] [address] Phone: [phone] Fax: [phone]     Social Determinants of Health (SDOH) Interventions    Readmission Risk Interventions No flowsheet data found.

## 2020-10-31 NOTE — Telephone Encounter (Signed)
Upon review for program, on 10/06/20 pt did not show for [facility] at first appointment. Pt stated upon appt f/u contact, he was unable to commit to program due to work schedule. At the request of [facility] today, reviewed pt appointment history and will forward to [facility] leader to determine eligibility for entering program.

## 2020-11-01 DIAGNOSIS — F331 Major depressive disorder, recurrent, moderate: Secondary | ICD-10-CM

## 2020-11-01 DIAGNOSIS — F10232 Alcohol dependence with withdrawal with perceptual disturbance: Secondary | ICD-10-CM

## 2020-11-01 DIAGNOSIS — I10 Essential (primary) hypertension: Secondary | ICD-10-CM

## 2020-11-01 MED ORDER — CHLORDIAZEPOXIDE HCL 5 MG PO CAPS
10.0000 mg | ORAL_CAPSULE | Freq: Three times a day (TID) | ORAL | Status: DC
  Administered 2020-11-01 – 2020-11-04 (×10): 10 mg via ORAL
  Filled 2020-11-01 (×10): qty 2

## 2020-11-01 NOTE — Progress Notes (Signed)
PROGRESS NOTE  Eric Keller  HDQ:[id number] DOB: 1986-03-03 DOA: 10/30/2020 PCP: Arvilla Market, DO   Brief Narrative: Eric Keller is a 35 y.o. male with a history of polysubstance abuse, MDD, GAD, and HTN who presented to [facility] urgent care for reports of suicidal ideation (thoughts of taking an overdose of benzodiazepines and alcohol to kill himself) and was sent to the ED for medical clearance due to anticipation of alcohol withdrawal (fifth of whiskey per day) with a history of withdrawal seizures. He required a significant amount of ativan in the ED, so admission was requested with psychiatry consult pending. Withdrawal symptoms have been ongoing, though psychiatry has cleared the patient and sitter is discontinued.  Assessment & Plan: Principal Problem:   Alcohol withdrawal (HCC) Active Problems:   Depression   Essential hypertension, benign   Suicidal ideation   Nausea and vomiting  Alcohol abuse and withdrawal: Last EtOH 5/12 AM. Level was 18 on arrival to ED. At high risk for withdrawal seizures (has history of this) - Continue CIWA protocol and close monitoring in PCU. Prefer oral ativan since patient not vomiting, but can give IV if needed. - Start librium taper - Continue thiamine, folate, MVM.   MDD, GAD, passive suicidal ideation:  - Psychiatry consulted and recommended antidepressant medications as ordered.  - Suicide precautions discontinued.  - Continue lexapro, gabapentin, trazodone - CSW working on resource provision for intensive outpatient substance abuse therapy.  HTN:  - Continue clonidine  Nausea, emesis: Lipase wnl.  - Monitor, prn antiemetics.   DVT prophylaxis: Lovenox Code Status: Full Family Communication: None at bedside Disposition Plan:  Status is: Inpatient  Remains inpatient appropriate because:requires closely monitored management of severe alcohol withdrawal   Dispo: The patient is from: Home              Anticipated d/c is  to: Home              Patient currently is not medically stable to d/c.   Difficult to place patient No  Consultants:   Psychiatry  Procedures:   None  Antimicrobials:  None   Subjective: Seeing streaks and hearing things that aren't there, feeling tremors between doses of ativan. Gotten 6 doses over past 24 hours. No SI. Feels hopeless and depressed, but no desire to die or harm himself.   Objective: Vitals:   11/01/20 0038 11/01/20 0400 11/01/20 0656 11/01/20 0944  BP: (!) 145/70 140/74 (!) 158/94 (!) 149/77  Pulse: 90 (!) 55 84 93  Resp:   18   Temp:   98.1 F (36.7 C)   TempSrc:   Oral   SpO2:   100%     Intake/Output Summary (Last 24 hours) at 11/01/2020 1011 Last data filed at 10/31/2020 1725 Gross per 24 hour  Intake 611.5 ml  Output --  Net 611.5 ml   There were no vitals filed for this visit.  Gen: 35 y.o. male in no distress, mildly diaphoretic Pulm: Non-labored breathing room air, tahcypneic. Clear to auscultation bilaterally.  CV: Regular borderline tachycardia. No murmur, rub, or gallop. No JVD, no pedal edema. GI: Abdomen soft, non-tender, non-distended, with normoactive bowel sounds. No organomegaly or masses felt. Ext: Warm, no deformities Skin: No rashes, lesions or ulcers Neuro: Alert and oriented. No focal neurological deficits. Psych: Judgement and insight appear normal. Mood & affect appropriate.   Data Reviewed: I have personally reviewed following labs and imaging studies  CBC: Recent Labs  Lab 10/30/20 1414 10/31/20 0415  WBC 7.0 7.3  NEUTROABS 3.6  --   HGB 16.5 16.0  HCT 49.4 47.2  MCV 100.0 98.7  PLT 278 224   Basic Metabolic Panel: Recent Labs  Lab 10/30/20 1414 10/31/20 0415  NA 137 137  K 4.3 4.0  CL 100 100  CO2 30 30  GLUCOSE 88 89  BUN 12 12  CREATININE 1.17 1.27*  CALCIUM 9.0 9.0  MG  --  2.2  PHOS  --  3.0   GFR: CrCl cannot be calculated (Unknown ideal weight.). Liver Function Tests: Recent Labs  Lab  10/30/20 1414 10/31/20 0415  AST 30 28  ALT 28 23  ALKPHOS 55 50  BILITOT 1.0 1.5*  PROT 7.0 6.2*  ALBUMIN 4.0 3.6   Recent Labs  Lab 10/30/20 1414  LIPASE 30   No results for input(s): AMMONIA in the last 168 hours. Coagulation Profile: No results for input(s): INR, PROTIME in the last 168 hours. Cardiac Enzymes: No results for input(s): CKTOTAL, CKMB, CKMBINDEX, TROPONINI in the last 168 hours. BNP (last 3 results) No results for input(s): PROBNP in the last 8760 hours. HbA1C: No results for input(s): HGBA1C in the last 72 hours. CBG: No results for input(s): GLUCAP in the last 168 hours. Lipid Profile: No results for input(s): CHOL, HDL, LDLCALC, TRIG, CHOLHDL, LDLDIRECT in the last 72 hours. Thyroid Function Tests: No results for input(s): TSH, T4TOTAL, FREET4, T3FREE, THYROIDAB in the last 72 hours. Anemia Panel: No results for input(s): VITAMINB12, FOLATE, FERRITIN, TIBC, IRON, RETICCTPCT in the last 72 hours. Urine analysis:    Component Value Date/Time   COLORURINE YELLOW 10/02/2019 0019   APPEARANCEUR CLEAR 10/02/2019 0019   APPEARANCEUR Clear 12/10/2011 1538   LABSPEC 1.009 10/02/2019 0019   LABSPEC 1.004 12/10/2011 1538   PHURINE 6.0 10/02/2019 0019   GLUCOSEU NEGATIVE 10/02/2019 0019   GLUCOSEU Negative 12/10/2011 1538   HGBUR NEGATIVE 10/02/2019 0019   BILIRUBINUR NEGATIVE 10/02/2019 0019   BILIRUBINUR Negative 12/10/2011 1538   KETONESUR 5 (A) 10/02/2019 0019   PROTEINUR NEGATIVE 10/02/2019 0019   UROBILINOGEN 0.2 12/17/2018 1836   NITRITE NEGATIVE 10/02/2019 0019   LEUKOCYTESUR NEGATIVE 10/02/2019 0019   LEUKOCYTESUR Negative 12/10/2011 1538   Recent Results (from the past 240 hour(s))  Resp Panel by RT-PCR (Flu A&B, Covid) Nasopharyngeal Swab     Status: None   Collection Time: 10/30/20  2:00 PM   Specimen: Nasopharyngeal Swab; Nasopharyngeal(NP) swabs in vial transport medium  Result Value Ref Range Status   SARS Coronavirus 2 by RT PCR  NEGATIVE NEGATIVE Final    Comment: (NOTE) SARS-CoV-2 target nucleic acids are NOT DETECTED.  The SARS-CoV-2 RNA is generally detectable in upper respiratory specimens during the acute phase of infection. The lowest concentration of SARS-CoV-2 viral copies this assay can detect is 138 copies/mL. A negative result does not preclude SARS-Cov-2 infection and should not be used as the sole basis for treatment or other patient management decisions. A negative result may occur with  improper specimen collection/handling, submission of specimen other than nasopharyngeal swab, presence of viral mutation(s) within the areas targeted by this assay, and inadequate number of viral copies(<138 copies/mL). A negative result must be combined with clinical observations, patient history, and epidemiological information. The expected result is Negative.  Fact Sheet for Patients:  BloggerCourse.com  Fact Sheet for Healthcare Providers:  SeriousBroker.it  This test is no t yet approved or cleared by the [place] FDA and  has been authorized for detection and/or diagnosis of  SARS-CoV-2 by FDA under an Emergency Use Authorization (EUA). This EUA will remain  in effect (meaning this test can be used) for the duration of the COVID-19 declaration under Section 564(b)(1) of the Act, 21 U.S.C.section 360bbb-3(b)(1), unless the authorization is terminated  or revoked sooner.       Influenza A by PCR NEGATIVE NEGATIVE Final   Influenza B by PCR NEGATIVE NEGATIVE Final    Comment: (NOTE) The Xpert Xpress SARS-CoV-2/FLU/RSV plus assay is intended as an aid in the diagnosis of influenza from Nasopharyngeal swab specimens and should not be used as a sole basis for treatment. Nasal washings and aspirates are unacceptable for Xpert Xpress SARS-CoV-2/FLU/RSV testing.  Fact Sheet for Patients: BloggerCourse.com  Fact Sheet for  Healthcare Providers: SeriousBroker.it  This test is not yet approved or cleared by the [place] FDA and has been authorized for detection and/or diagnosis of SARS-CoV-2 by FDA under an Emergency Use Authorization (EUA). This EUA will remain in effect (meaning this test can be used) for the duration of the COVID-19 declaration under Section 564(b)(1) of the Act, 21 U.S.C. section 360bbb-3(b)(1), unless the authorization is terminated or revoked.  Performed at [facility], [address] [place], [place] [address]       Radiology Studies: No results found.  Scheduled Meds: . cloNIDine  0.1 mg Oral TID  . enoxaparin (LOVENOX) injection  40 mg Subcutaneous Q24H  . escitalopram  20 mg Oral Daily  . feeding supplement  237 mL Oral BID BM  . folic acid  1 mg Oral Daily  . gabapentin  400 mg Oral TID  . multivitamin with minerals  1 tablet Oral Daily  . thiamine  100 mg Oral Daily   Or  . thiamine  100 mg Intravenous Daily   Continuous Infusions:   LOS: 1 day   Time spent: 35 minutes.  Tyrone Nine, MD [facility] [email or web address] 11/01/2020, 10:11 AM

## 2020-11-01 NOTE — Plan of Care (Signed)

## 2020-11-02 DIAGNOSIS — R45851 Suicidal ideations: Secondary | ICD-10-CM

## 2020-11-02 NOTE — Progress Notes (Signed)
PROGRESS NOTE  Eric Keller  JJO:[id number] DOB: 25-Oct-1985 DOA: 10/30/2020 PCP: Arvilla Market, DO   Brief Narrative: Eric Keller is a 35 y.o. male with a history of polysubstance abuse, MDD, GAD, and HTN who presented to [facility] urgent care for reports of suicidal ideation (thoughts of taking an overdose of benzodiazepines and alcohol to kill himself) and was sent to the ED for medical clearance due to anticipation of alcohol withdrawal (fifth of whiskey per day) with a history of withdrawal seizures. He required a significant amount of ativan in the ED, so admission was requested with psychiatry consult pending. Withdrawal symptoms have been ongoing, though psychiatry has cleared the patient and sitter is discontinued.  Assessment & Plan: Principal Problem:   Alcohol withdrawal (HCC) Active Problems:   Depression   Essential hypertension, benign   Suicidal ideation   Nausea and vomiting  Alcohol abuse and withdrawal: Last EtOH 5/12 AM. Level was 18 on arrival to ED. At high risk for withdrawal seizures (has history of this) - Continue CIWA protocol and close monitoring in PCU. Prefer oral ativan since patient not vomiting, but can give IV if needed. Has needed 12mg  in < the past 24 hours despite starting TID librium.  - Unable to taper librium yet.  - Continue thiamine, folate, MVM.   MDD, GAD, passive suicidal ideation:  - Psychiatry consulted and recommended antidepressant medications as ordered.  - Suicide precautions discontinued.  - Continue lexapro, gabapentin, trazodone - CSW working on resource provision for intensive outpatient substance abuse therapy.  HTN:  - Continue clonidine  Nausea, emesis: Lipase wnl. Improving. - Monitor, prn antiemetics.   DVT prophylaxis: Lovenox Code Status: Full Family Communication: None at bedside Disposition Plan:  Status is: Inpatient  Remains inpatient appropriate because:requires closely monitored management of severe  alcohol withdrawal   Dispo: The patient is from: Home              Anticipated d/c is to: Home, possibly 5/16 - 5/17.              Patient currently is not medically stable to d/c.   Difficult to place patient No  Consultants:   Psychiatry  Procedures:   None  Antimicrobials:  None   Subjective: Depression/hopelessness is stable. No desire to harm himself. Future-directed thinking today. Withdrawal symptoms are the same as yesterday, tremor, moderate, waxing/waning responsive to ativan. Not getting OOB much at all. No chest pain, dyspnea.   Objective: Vitals:   11/02/20 0400 11/02/20 0500 11/02/20 0812 11/02/20 0822  BP: 123/63 (!) 151/70 (!) 150/79   Pulse: 65 64 66   Resp:  13 10   Temp:  98.2 F (36.8 C) 97.6 F (36.4 C)   TempSrc:  Axillary Oral   SpO2:    98%    Intake/Output Summary (Last 24 hours) at 11/02/2020 1121 Last data filed at 11/02/2020 0700 Gross per 24 hour  Intake --  Output 600 ml  Net -600 ml   Gen: 35 y.o. male in no distress Pulm: Nonlabored breathing room air. Clear. CV: Regular rate and rhythm. No murmur, rub, or gallop. No JVD, no dependent edema. GI: Abdomen soft, non-tender, non-distended, with normoactive bowel sounds.  Ext: Warm, no deformities Skin: No rashes, lesions or ulcers on visualized skin. Neuro: Alert and oriented. No focal neurological deficits. Psych: Judgement and insight appear wnl. Mood depressed but broad affect. Behavior is appropriate.    Data Reviewed: I have personally reviewed following labs and imaging studies  CBC: Recent Labs  Lab 10/30/20 1414 10/31/20 0415  WBC 7.0 7.3  NEUTROABS 3.6  --   HGB 16.5 16.0  HCT 49.4 47.2  MCV 100.0 98.7  PLT 278 224   Basic Metabolic Panel: Recent Labs  Lab 10/30/20 1414 10/31/20 0415  NA 137 137  K 4.3 4.0  CL 100 100  CO2 30 30  GLUCOSE 88 89  BUN 12 12  CREATININE 1.17 1.27*  CALCIUM 9.0 9.0  MG  --  2.2  PHOS  --  3.0   GFR: CrCl cannot be  calculated (Unknown ideal weight.). Liver Function Tests: Recent Labs  Lab 10/30/20 1414 10/31/20 0415  AST 30 28  ALT 28 23  ALKPHOS 55 50  BILITOT 1.0 1.5*  PROT 7.0 6.2*  ALBUMIN 4.0 3.6   Recent Labs  Lab 10/30/20 1414  LIPASE 30   No results for input(s): AMMONIA in the last 168 hours. Coagulation Profile: No results for input(s): INR, PROTIME in the last 168 hours. Cardiac Enzymes: No results for input(s): CKTOTAL, CKMB, CKMBINDEX, TROPONINI in the last 168 hours. BNP (last 3 results) No results for input(s): PROBNP in the last 8760 hours. HbA1C: No results for input(s): HGBA1C in the last 72 hours. CBG: No results for input(s): GLUCAP in the last 168 hours. Lipid Profile: No results for input(s): CHOL, HDL, LDLCALC, TRIG, CHOLHDL, LDLDIRECT in the last 72 hours. Thyroid Function Tests: No results for input(s): TSH, T4TOTAL, FREET4, T3FREE, THYROIDAB in the last 72 hours. Anemia Panel: No results for input(s): VITAMINB12, FOLATE, FERRITIN, TIBC, IRON, RETICCTPCT in the last 72 hours. Urine analysis:    Component Value Date/Time   COLORURINE YELLOW 10/02/2019 0019   APPEARANCEUR CLEAR 10/02/2019 0019   APPEARANCEUR Clear 12/10/2011 1538   LABSPEC 1.009 10/02/2019 0019   LABSPEC 1.004 12/10/2011 1538   PHURINE 6.0 10/02/2019 0019   GLUCOSEU NEGATIVE 10/02/2019 0019   GLUCOSEU Negative 12/10/2011 1538   HGBUR NEGATIVE 10/02/2019 0019   BILIRUBINUR NEGATIVE 10/02/2019 0019   BILIRUBINUR Negative 12/10/2011 1538   KETONESUR 5 (A) 10/02/2019 0019   PROTEINUR NEGATIVE 10/02/2019 0019   UROBILINOGEN 0.2 12/17/2018 1836   NITRITE NEGATIVE 10/02/2019 0019   LEUKOCYTESUR NEGATIVE 10/02/2019 0019   LEUKOCYTESUR Negative 12/10/2011 1538   Recent Results (from the past 240 hour(s))  Resp Panel by RT-PCR (Flu A&B, Covid) Nasopharyngeal Swab     Status: None   Collection Time: 10/30/20  2:00 PM   Specimen: Nasopharyngeal Swab; Nasopharyngeal(NP) swabs in vial  transport medium  Result Value Ref Range Status   SARS Coronavirus 2 by RT PCR NEGATIVE NEGATIVE Final    Comment: (NOTE) SARS-CoV-2 target nucleic acids are NOT DETECTED.  The SARS-CoV-2 RNA is generally detectable in upper respiratory specimens during the acute phase of infection. The lowest concentration of SARS-CoV-2 viral copies this assay can detect is 138 copies/mL. A negative result does not preclude SARS-Cov-2 infection and should not be used as the sole basis for treatment or other patient management decisions. A negative result may occur with  improper specimen collection/handling, submission of specimen other than nasopharyngeal swab, presence of viral mutation(s) within the areas targeted by this assay, and inadequate number of viral copies(<138 copies/mL). A negative result must be combined with clinical observations, patient history, and epidemiological information. The expected result is Negative.  Fact Sheet for Patients:  BloggerCourse.com  Fact Sheet for Healthcare Providers:  SeriousBroker.it  This test is no t yet approved or cleared by the [place] FDA  and  has been authorized for detection and/or diagnosis of SARS-CoV-2 by FDA under an Emergency Use Authorization (EUA). This EUA will remain  in effect (meaning this test can be used) for the duration of the COVID-19 declaration under Section 564(b)(1) of the Act, 21 U.S.C.section 360bbb-3(b)(1), unless the authorization is terminated  or revoked sooner.       Influenza A by PCR NEGATIVE NEGATIVE Final   Influenza B by PCR NEGATIVE NEGATIVE Final    Comment: (NOTE) The Xpert Xpress SARS-CoV-2/FLU/RSV plus assay is intended as an aid in the diagnosis of influenza from Nasopharyngeal swab specimens and should not be used as a sole basis for treatment. Nasal washings and aspirates are unacceptable for Xpert Xpress SARS-CoV-2/FLU/RSV testing.  Fact  Sheet for Patients: BloggerCourse.com  Fact Sheet for Healthcare Providers: SeriousBroker.it  This test is not yet approved or cleared by the [place] FDA and has been authorized for detection and/or diagnosis of SARS-CoV-2 by FDA under an Emergency Use Authorization (EUA). This EUA will remain in effect (meaning this test can be used) for the duration of the COVID-19 declaration under Section 564(b)(1) of the Act, 21 U.S.C. section 360bbb-3(b)(1), unless the authorization is terminated or revoked.  Performed at [facility], [address] [place], [place] [address]       Radiology Studies: No results found.  Scheduled Meds: . chlordiazePOXIDE  10 mg Oral TID  . cloNIDine  0.1 mg Oral TID  . enoxaparin (LOVENOX) injection  40 mg Subcutaneous Q24H  . escitalopram  20 mg Oral Daily  . feeding supplement  237 mL Oral BID BM  . folic acid  1 mg Oral Daily  . gabapentin  400 mg Oral TID  . multivitamin with minerals  1 tablet Oral Daily  . thiamine  100 mg Oral Daily   Or  . thiamine  100 mg Intravenous Daily   Continuous Infusions:   LOS: 2 days   Time spent: 25 minutes.  Tyrone Nine, MD [facility] [email or web address] 11/02/2020, 11:21 AM

## 2020-11-02 NOTE — Plan of Care (Signed)

## 2020-11-03 MED ORDER — LORAZEPAM 1 MG PO TABS
1.0000 mg | ORAL_TABLET | ORAL | Status: DC | PRN
  Administered 2020-11-03: 4 mg via ORAL
  Administered 2020-11-03: 3 mg via ORAL
  Administered 2020-11-03: 2 mg via ORAL
  Administered 2020-11-03 – 2020-11-04 (×2): 3 mg via ORAL
  Administered 2020-11-04: 4 mg via ORAL
  Filled 2020-11-03: qty 3
  Filled 2020-11-03: qty 2
  Filled 2020-11-03: qty 3
  Filled 2020-11-03 (×2): qty 4
  Filled 2020-11-03: qty 3

## 2020-11-03 NOTE — Progress Notes (Signed)
PROGRESS NOTE  Eric Keller  BPZ:[id number] DOB: 1985/07/02 DOA: 10/30/2020 PCP: Arvilla Market, DO   Brief Narrative: Eric Keller is a 35 y.o. male with a history of polysubstance abuse, MDD, GAD, and HTN who presented to [facility] urgent care for reports of suicidal ideation (thoughts of taking an overdose of benzodiazepines and alcohol to kill himself) and was sent to the ED for medical clearance due to anticipation of alcohol withdrawal (fifth of whiskey per day) with a history of withdrawal seizures. He required a significant amount of ativan in the ED, so admission was requested with psychiatry consult pending. Withdrawal symptoms have been ongoing, though psychiatry has cleared the patient and sitter is discontinued.  Assessment & Plan: Principal Problem:   Alcohol withdrawal (HCC) Active Problems:   Depression   Essential hypertension, benign   Suicidal ideation   Nausea and vomiting  Alcohol abuse and withdrawal: Last EtOH 5/12 AM. Level was 18 on arrival to ED. At high risk for withdrawal seizures (has history of this) - Continue CIWA protocol, renewed ativan orders. Despite lirbium TID, still required 5mg  ativan over past 24 hours (none since last night due to order timing out). Changed to med-surg status.   - CSW to provide resources now that pt's more alert. - Continue thiamine, folate, MVM.   MDD, GAD, passive suicidal ideation:  - Psychiatry consulted and recommended antidepressant medications as ordered.  - Suicide precautions discontinued.  - Continue lexapro, gabapentin, trazodone - CSW working on resource provision for intensive outpatient substance abuse therapy.  HTN:  - Continue clonidine  Nausea, emesis: Lipase wnl. Improving. - Monitor, prn antiemetics.   DVT prophylaxis: Lovenox Code Status: Full Family Communication: None at bedside Disposition Plan:  Status is: Inpatient  Remains inpatient appropriate because:requires closely monitored  management of severe alcohol withdrawal   Dispo: The patient is from: Home              Anticipated d/c is to: Home, possibly 5/17.              Patient currently is not medically stable to d/c.   Difficult to place patient No  Consultants:   Psychiatry  Procedures:   None  Antimicrobials:  None   Subjective: Tremor, anxiety, subjective feelings of withdrawal have been improving steadily, feeling more clear headed. Hasn't had ativan today and feels like they're all getting worse again. Remains feeling very depressed and hopeless without intention to harm himself.    Objective: Vitals:   11/02/20 1239 11/02/20 1622 11/02/20 1931 11/03/20 0624  BP: 135/65  140/90 (!) 133/99  Pulse: 74  78 81  Resp: 12  16 18   Temp: 98.3 F (36.8 C) 97.9 F (36.6 C) 97.7 F (36.5 C) 98.2 F (36.8 C)  TempSrc: Oral Oral Oral   SpO2:   98% 100%   Gen: 36 y.o. male in no distress Pulm: Nonlabored breathing room air. Clear. CV: Regular rate and rhythm. No murmur, rub, or gallop. No JVD, no dependent edema. GI: Abdomen soft, non-tender, non-distended, with normoactive bowel sounds.  Ext: Warm, no deformities Skin: No rashes, lesions or ulcers on visualized skin. Neuro: Alert and oriented. No focal neurological deficits. Psych: Judgement and insight appear in tact. Mood depressed with broad affect, pleasant demeanor. Behavior is appropriate.    Data Reviewed: I have personally reviewed following labs and imaging studies  CBC: Recent Labs  Lab 10/30/20 1414 10/31/20 0415  WBC 7.0 7.3  NEUTROABS 3.6  --   HGB 16.5  16.0  HCT 49.4 47.2  MCV 100.0 98.7  PLT 278 224   Basic Metabolic Panel: Recent Labs  Lab 10/30/20 1414 10/31/20 0415  NA 137 137  K 4.3 4.0  CL 100 100  CO2 30 30  GLUCOSE 88 89  BUN 12 12  CREATININE 1.17 1.27*  CALCIUM 9.0 9.0  MG  --  2.2  PHOS  --  3.0   GFR: CrCl cannot be calculated (Unknown ideal weight.). Liver Function Tests: Recent Labs  Lab  10/30/20 1414 10/31/20 0415  AST 30 28  ALT 28 23  ALKPHOS 55 50  BILITOT 1.0 1.5*  PROT 7.0 6.2*  ALBUMIN 4.0 3.6   Recent Labs  Lab 10/30/20 1414  LIPASE 30   No results for input(s): AMMONIA in the last 168 hours. Coagulation Profile: No results for input(s): INR, PROTIME in the last 168 hours. Cardiac Enzymes: No results for input(s): CKTOTAL, CKMB, CKMBINDEX, TROPONINI in the last 168 hours. BNP (last 3 results) No results for input(s): PROBNP in the last 8760 hours. HbA1C: No results for input(s): HGBA1C in the last 72 hours. CBG: No results for input(s): GLUCAP in the last 168 hours. Lipid Profile: No results for input(s): CHOL, HDL, LDLCALC, TRIG, CHOLHDL, LDLDIRECT in the last 72 hours. Thyroid Function Tests: No results for input(s): TSH, T4TOTAL, FREET4, T3FREE, THYROIDAB in the last 72 hours. Anemia Panel: No results for input(s): VITAMINB12, FOLATE, FERRITIN, TIBC, IRON, RETICCTPCT in the last 72 hours. Urine analysis:    Component Value Date/Time   COLORURINE YELLOW 10/02/2019 0019   APPEARANCEUR CLEAR 10/02/2019 0019   APPEARANCEUR Clear 12/10/2011 1538   LABSPEC 1.009 10/02/2019 0019   LABSPEC 1.004 12/10/2011 1538   PHURINE 6.0 10/02/2019 0019   GLUCOSEU NEGATIVE 10/02/2019 0019   GLUCOSEU Negative 12/10/2011 1538   HGBUR NEGATIVE 10/02/2019 0019   BILIRUBINUR NEGATIVE 10/02/2019 0019   BILIRUBINUR Negative 12/10/2011 1538   KETONESUR 5 (A) 10/02/2019 0019   PROTEINUR NEGATIVE 10/02/2019 0019   UROBILINOGEN 0.2 12/17/2018 1836   NITRITE NEGATIVE 10/02/2019 0019   LEUKOCYTESUR NEGATIVE 10/02/2019 0019   LEUKOCYTESUR Negative 12/10/2011 1538   Recent Results (from the past 240 hour(s))  Resp Panel by RT-PCR (Flu A&B, Covid) Nasopharyngeal Swab     Status: None   Collection Time: 10/30/20  2:00 PM   Specimen: Nasopharyngeal Swab; Nasopharyngeal(NP) swabs in vial transport medium  Result Value Ref Range Status   SARS Coronavirus 2 by RT PCR  NEGATIVE NEGATIVE Final    Comment: (NOTE) SARS-CoV-2 target nucleic acids are NOT DETECTED.  The SARS-CoV-2 RNA is generally detectable in upper respiratory specimens during the acute phase of infection. The lowest concentration of SARS-CoV-2 viral copies this assay can detect is 138 copies/mL. A negative result does not preclude SARS-Cov-2 infection and should not be used as the sole basis for treatment or other patient management decisions. A negative result may occur with  improper specimen collection/handling, submission of specimen other than nasopharyngeal swab, presence of viral mutation(s) within the areas targeted by this assay, and inadequate number of viral copies(<138 copies/mL). A negative result must be combined with clinical observations, patient history, and epidemiological information. The expected result is Negative.  Fact Sheet for Patients:  BloggerCourse.com  Fact Sheet for Healthcare Providers:  SeriousBroker.it  This test is no t yet approved or cleared by the [place] FDA and  has been authorized for detection and/or diagnosis of SARS-CoV-2 by FDA under an Emergency Use Authorization (EUA). This EUA will  remain  in effect (meaning this test can be used) for the duration of the COVID-19 declaration under Section 564(b)(1) of the Act, 21 U.S.C.section 360bbb-3(b)(1), unless the authorization is terminated  or revoked sooner.       Influenza A by PCR NEGATIVE NEGATIVE Final   Influenza B by PCR NEGATIVE NEGATIVE Final    Comment: (NOTE) The Xpert Xpress SARS-CoV-2/FLU/RSV plus assay is intended as an aid in the diagnosis of influenza from Nasopharyngeal swab specimens and should not be used as a sole basis for treatment. Nasal washings and aspirates are unacceptable for Xpert Xpress SARS-CoV-2/FLU/RSV testing.  Fact Sheet for Patients: BloggerCourse.com  Fact Sheet for  Healthcare Providers: SeriousBroker.it  This test is not yet approved or cleared by the [place] FDA and has been authorized for detection and/or diagnosis of SARS-CoV-2 by FDA under an Emergency Use Authorization (EUA). This EUA will remain in effect (meaning this test can be used) for the duration of the COVID-19 declaration under Section 564(b)(1) of the Act, 21 U.S.C. section 360bbb-3(b)(1), unless the authorization is terminated or revoked.  Performed at [facility], [address] [place], [place] [address]       Radiology Studies: No results found.  Scheduled Meds: . chlordiazePOXIDE  10 mg Oral TID  . cloNIDine  0.1 mg Oral TID  . enoxaparin (LOVENOX) injection  40 mg Subcutaneous Q24H  . escitalopram  20 mg Oral Daily  . feeding supplement  237 mL Oral BID BM  . folic acid  1 mg Oral Daily  . gabapentin  400 mg Oral TID  . multivitamin with minerals  1 tablet Oral Daily  . thiamine  100 mg Oral Daily   Or  . thiamine  100 mg Intravenous Daily   Continuous Infusions:   LOS: 3 days   Time spent: 25 minutes.  Tyrone Nine, MD [facility] [email or web address] 11/03/2020, 11:20 AM

## 2020-11-04 ENCOUNTER — Other Ambulatory Visit: Payer: Self-pay

## 2020-11-04 MED ORDER — CHLORDIAZEPOXIDE HCL 5 MG PO CAPS
ORAL_CAPSULE | ORAL | 0 refills | Status: DC
  Filled 2020-11-04: qty 24, 7d supply, fill #0

## 2020-11-04 NOTE — Discharge Summary (Signed)
Physician Discharge Summary  Eric Keller HYI:[id number] DOB: [birth year] DOA: 10/30/2020  PCP: Arvilla Market, DO  Admit date: 10/30/2020 Discharge date: 11/04/2020  Admitted From: Home Disposition: Home   Recommendations for Outpatient Follow-up:  1. Follow up for intake appt with [facility] for substance abuse IOP on 5/19. 2. Follow up with PCP  Home Health: None Equipment/Devices: None Discharge Condition: Stable CODE STATUS: Full Diet recommendation: Heart healthy  Brief/Interim Summary: Eric Robinsonis a34 y.o.malewith a history of polysubstance abuse, MDD, GAD, and HTN who presented to [facility] urgent care for reports of suicidal ideation (thoughts of taking an overdose of benzodiazepines and alcohol to kill himself) and was sent to the ED for medical clearance due to anticipation of alcohol withdrawal (fifth of whiskey per day) with a history of withdrawal seizures. He required a significant amount of ativan in the ED, so admission was requested with psychiatry consult pending. Withdrawal symptoms have improved with librium taper which will be continued at discharge. Psychiatry has cleared the patient and sitter is discontinued. CSW has facilitated patient's scheduling intake appt for substance abuse IOP 5/19.  Discharge Diagnoses:  Principal Problem:   Alcohol withdrawal (HCC) Active Problems:   Depression   Essential hypertension, benign   Suicidal ideation   Nausea and vomiting  Alcohol abuse and withdrawal: Last EtOH 5/12 AM. Level was 18 on arrival to ED. Now >5 days from last drink and symptoms overall improving.  - Continue librium in taper as below.  - CSW to provide resources now that pt's more alert.  MDD, GAD, passive suicidal ideation:  - Psychiatry consulted and recommended antidepressant medications as ordered.  - CSW working on resource provision for intensive outpatient substance abuse therapy.  HTN:  - Continue  clonidine  Nausea, emesis: Lipase wnl. Resolved.  Discharge Instructions Discharge Instructions    Discharge instructions   Complete by: As directed    You were evaluated for alcohol withdrawal which has improved. You are stable for discharge with plans to continue a 7 day librium taper and follow up for IOP assessment on Thursday. If you notice worsening symptoms, seizure, or plan to harm yourself, seek medical attention right away.     Allergies as of 11/04/2020   No Known Allergies     Medication List    TAKE these medications   acetaminophen 500 MG tablet Commonly known as: TYLENOL Take 500 mg by mouth every 8 (eight) hours as needed for headache.   chlordiazePOXIDE 5 MG capsule Commonly known as: LIBRIUM Take 2 capsules (10 mg total) by mouth 3 (three) times daily for 2 days, THEN 1 capsule (5 mg total) 3 (three) times daily for 2 days, THEN 1 capsule (5 mg total) 2 (two) times daily for 3 days. Start taking on: Nov 04, 2020   cloNIDine 0.1 MG tablet Commonly known as: CATAPRES TAKE 1 TABLET (0.1 MG TOTAL) BY MOUTH 3 (THREE) TIMES DAILY. What changed:   how much to take  how to take this  when to take this   diphenhydrAMINE 25 MG tablet Commonly known as: BENADRYL Take 25 mg by mouth at bedtime as needed for allergies.   escitalopram 10 MG tablet Commonly known as: LEXAPRO TAKE 2 TABLETS (20 MG TOTAL) BY MOUTH DAILY. What changed: how much to take   gabapentin 400 MG capsule Commonly known as: NEURONTIN TAKE 1 CAPSULE (400 MG TOTAL) BY MOUTH 3 (THREE) TIMES DAILY. What changed:   how much to take  how to  take this  when to take this   metoprolol tartrate 50 MG tablet Commonly known as: LOPRESSOR TAKE 1 TABLET (50 MG TOTAL) BY MOUTH 2 (TWO) TIMES DAILY. What changed: how much to take   QUEtiapine 25 MG tablet Commonly known as: SEROQUEL TAKE 1 TABLET (25 MG TOTAL) BY MOUTH AT BEDTIME. What changed: how much to take       Follow-up Information     Arvilla Market, DO Follow up.   Specialty: Family Medicine Contact information: [address] [place] [address] [phone]        [facility] Follow up.   Specialty: Urgent Care Contact information: [address] [place] [address] [phone]             No Known Allergies  Consultations:  Psychiatry  Subjective: Overall feeling better every day. Some anxiety and stable severe depression but remains forward thinking with plan toward sobriety. No SI.   Discharge Exam: Vitals:   11/04/20 0629 11/04/20 0812  BP: (!) 138/92 (!) 139/91  Pulse: 71 60  Resp: 18 17  Temp:  98.1 F (36.7 C)  SpO2: 100% 100%   General: Pt is alert, awake, not in acute distress Cardiovascular: RRR, S1/S2 +, no rubs, no gallops Respiratory: CTA bilaterally, no wheezing, no rhonchi Abdominal: Soft, NT, ND, bowel sounds + Extremities: No edema, no cyanosis  Labs: BNP (last 3 results) No results for input(s): BNP in the last 8760 hours. Basic Metabolic Panel: Recent Labs  Lab 10/30/20 1414 10/31/20 0415  NA 137 137  K 4.3 4.0  CL 100 100  CO2 30 30  GLUCOSE 88 89  BUN 12 12  CREATININE 1.17 1.27*  CALCIUM 9.0 9.0  MG  --  2.2  PHOS  --  3.0   Liver Function Tests: Recent Labs  Lab 10/30/20 1414 10/31/20 0415  AST 30 28  ALT 28 23  ALKPHOS 55 50  BILITOT 1.0 1.5*  PROT 7.0 6.2*  ALBUMIN 4.0 3.6   Recent Labs  Lab 10/30/20 1414  LIPASE 30   No results for input(s): AMMONIA in the last 168 hours. CBC: Recent Labs  Lab 10/30/20 1414 10/31/20 0415  WBC 7.0 7.3  NEUTROABS 3.6  --   HGB 16.5 16.0  HCT 49.4 47.2  MCV 100.0 98.7  PLT 278 224   Cardiac Enzymes: No results for input(s): CKTOTAL, CKMB, CKMBINDEX, TROPONINI in the last 168 hours. BNP: Invalid input(s): POCBNP CBG: No results for input(s): GLUCAP in the last 168 hours. D-Dimer No results for input(s): DDIMER in the last 72  hours. Hgb A1c No results for input(s): HGBA1C in the last 72 hours. Lipid Profile No results for input(s): CHOL, HDL, LDLCALC, TRIG, CHOLHDL, LDLDIRECT in the last 72 hours. Thyroid function studies No results for input(s): TSH, T4TOTAL, T3FREE, THYROIDAB in the last 72 hours.  Invalid input(s): FREET3 Anemia work up No results for input(s): VITAMINB12, FOLATE, FERRITIN, TIBC, IRON, RETICCTPCT in the last 72 hours. Urinalysis    Component Value Date/Time   COLORURINE YELLOW 10/02/2019 0019   APPEARANCEUR CLEAR 10/02/2019 0019   APPEARANCEUR Clear 12/10/2011 1538   LABSPEC 1.009 10/02/2019 0019   LABSPEC 1.004 12/10/2011 1538   PHURINE 6.0 10/02/2019 0019   GLUCOSEU NEGATIVE 10/02/2019 0019   GLUCOSEU Negative 12/10/2011 1538   HGBUR NEGATIVE 10/02/2019 0019   BILIRUBINUR NEGATIVE 10/02/2019 0019   BILIRUBINUR Negative 12/10/2011 1538   KETONESUR 5 (A) 10/02/2019 0019   PROTEINUR  NEGATIVE 10/02/2019 0019   UROBILINOGEN 0.2 12/17/2018 1836   NITRITE NEGATIVE 10/02/2019 0019   LEUKOCYTESUR NEGATIVE 10/02/2019 0019   LEUKOCYTESUR Negative 12/10/2011 1538    Microbiology Recent Results (from the past 240 hour(s))  Resp Panel by RT-PCR (Flu A&B, Covid) Nasopharyngeal Swab     Status: None   Collection Time: 10/30/20  2:00 PM   Specimen: Nasopharyngeal Swab; Nasopharyngeal(NP) swabs in vial transport medium  Result Value Ref Range Status   SARS Coronavirus 2 by RT PCR NEGATIVE NEGATIVE Final    Comment: (NOTE) SARS-CoV-2 target nucleic acids are NOT DETECTED.  The SARS-CoV-2 RNA is generally detectable in upper respiratory specimens during the acute phase of infection. The lowest concentration of SARS-CoV-2 viral copies this assay can detect is 138 copies/mL. A negative result does not preclude SARS-Cov-2 infection and should not be used as the sole basis for treatment or other patient management decisions. A negative result may occur with  improper specimen  collection/handling, submission of specimen other than nasopharyngeal swab, presence of viral mutation(s) within the areas targeted by this assay, and inadequate number of viral copies(<138 copies/mL). A negative result must be combined with clinical observations, patient history, and epidemiological information. The expected result is Negative.  Fact Sheet for Patients:  BloggerCourse.com  Fact Sheet for Healthcare Providers:  SeriousBroker.it  This test is no t yet approved or cleared by the [place] FDA and  has been authorized for detection and/or diagnosis of SARS-CoV-2 by FDA under an Emergency Use Authorization (EUA). This EUA will remain  in effect (meaning this test can be used) for the duration of the COVID-19 declaration under Section 564(b)(1) of the Act, 21 U.S.C.section 360bbb-3(b)(1), unless the authorization is terminated  or revoked sooner.       Influenza A by PCR NEGATIVE NEGATIVE Final   Influenza B by PCR NEGATIVE NEGATIVE Final    Comment: (NOTE) The Xpert Xpress SARS-CoV-2/FLU/RSV plus assay is intended as an aid in the diagnosis of influenza from Nasopharyngeal swab specimens and should not be used as a sole basis for treatment. Nasal washings and aspirates are unacceptable for Xpert Xpress SARS-CoV-2/FLU/RSV testing.  Fact Sheet for Patients: BloggerCourse.com  Fact Sheet for Healthcare Providers: SeriousBroker.it  This test is not yet approved or cleared by the [place] FDA and has been authorized for detection and/or diagnosis of SARS-CoV-2 by FDA under an Emergency Use Authorization (EUA). This EUA will remain in effect (meaning this test can be used) for the duration of the COVID-19 declaration under Section 564(b)(1) of the Act, 21 U.S.C. section 360bbb-3(b)(1), unless the authorization is terminated or revoked.  Performed at [facility], [address] [place], [place] [address]     Time coordinating discharge: Approximately 40 minutes  Tyrone Nine, MD  [facility] 11/04/2020, 11:36 AM

## 2020-11-04 NOTE — TOC Progression Note (Signed)
Transition of Care [facility]) - Progression Note    Patient Details  Name: Kylle Lall MRN: [id number] Date of Birth: May 30, 1986  [facility]) CM/SW Contact  Lorri Frederick, LCSW Phone Number: 11/04/2020, 9:00 AM  Clinical Narrative: CSW asked by MD to review substance abuse treatment plan that was discussed on Friday.  CSW spoke with pt, went over referral to [facility] for substance abuse IOP, discussed that this program meets during day time hours, [facility] has evening IOP but he would have to pay for this program.  Pt asked for [facility] phone number and is going to reach out himself for any costs with that program.       Expected Discharge Plan: Home/Self Care Barriers to Discharge: Continued Medical Work up  Expected Discharge Plan and Services Expected Discharge Plan: Home/Self Care In-house Referral: Clinical Social Work   Post Acute Care Choice: NA Living arrangements for the past 2 months: Single Family Home                 DME Arranged: N/A           HH Agency: NA         Social Determinants of Health (SDOH) Interventions    Readmission Risk Interventions No flowsheet data found.

## 2020-11-04 NOTE — TOC Transition Note (Signed)
Transition of Care [facility]) - CM/SW Discharge Note   Patient Details  Name: Eric Keller MRN: [id number] Date of Birth: [birth year]  Transition of Care [facility]) CM/SW Contact:  Lorri Frederick, LCSW Phone Number: 11/04/2020, 1:25 PM   Clinical Narrative:  Pt discharging home with intake appt at [facility] for substance abuse IOP.  No other needs identified.       Final next level of care: Home/Self Care Barriers to Discharge: Barriers Resolved   Patient Goals and CMS Choice Patient states their goals for this hospitalization and ongoing recovery are:: "get back to sobriety, get my mind back" CMS Medicare.gov Compare Post Acute Care list provided to::  (na)    Discharge Placement                       Discharge Plan and Services In-house Referral: Clinical Social Work   Post Acute Care Choice: NA          DME Arranged: N/A           HH Agency: NA        Social Determinants of Health (SDOH) Interventions     Readmission Risk Interventions No flowsheet data found.

## 2020-11-05 ENCOUNTER — Other Ambulatory Visit: Payer: Self-pay

## 2020-11-05 ENCOUNTER — Telehealth: Payer: Self-pay

## 2020-11-05 NOTE — Telephone Encounter (Signed)
Transition Care Management Follow-up Telephone Call  Date of discharge and from where: 11/04/2020, [facility]  How have you been since you were released from the hospital? He said he is feeling better physically but is still depressed. He denied SI and said that he understands when to seek immediate  medical/behavioral health attention.  Any questions or concerns? Yes - noted above.  He is concerned about his depression and wants to receive help. He explained that he had been seen at [facility] by a psychiatrist and therapist twice before he went to the hospital 10/30/2020.  They were remote sessions. He plans to return to [facility] tomorrow for his appointment.  This CM explained to him that [facility] has a SW available for support/referrals if he is interested.   Items Reviewed:  Did the pt receive and understand the discharge instructions provided? Yes   Medications obtained and verified? He said he is on his way to pick up the librium at [facility] now .  He said that he has all medications but is running out of gabapentin.  He was not exactly sure when he last picked up his medications at the pharmacy. He said he has been paying out of pocket for the meds. This CM instructed him to speak to the pharmacist about patients assistance options for his medications. There may be programs that he is eligible for.  Per Mardee Postin, [facility] Pharmacy Tech, the patient last picked up his gabapentin 10/16/2020 and has a refill on that prescription   Other? No   Any new allergies since your discharge? No   Dietary orders reviewed? No  Do you have support at home? He said he lives with his dad and he helps his father as much as he can.  Home Care and Equipment/Supplies: Were home health services ordered? no If so, what is the name of the agency? n/a  Has the agency set up a time to come to the patient's home? not applicable Were any new equipment or medical supplies ordered?  No What is the name of the medical  supply agency? n/a Were you able to get the supplies/equipment? not applicable Do you have any questions related to the use of the equipment or supplies? No  Functional Questionnaire: (I = Independent and D = Dependent) ADLs: independent   Follow up appointments reviewed:   PCP Hospital f/u appt confirmed? Yes  - Dr Earlene Plater - 11/20/2020.  Specialist Hospital f/u appt confirmed? Yes  - [facility] - 11/06/2020.    Are transportation arrangements needed? No   If their condition worsens, is the pt aware to call PCP or go to the Emergency Dept.? Yes  Was the patient provided with contact information for the PCP's office or ED? Yes  Was to pt encouraged to call back with questions or concerns? Yes

## 2020-11-06 ENCOUNTER — Ambulatory Visit (INDEPENDENT_AMBULATORY_CARE_PROVIDER_SITE_OTHER): Payer: No Payment, Other | Admitting: Behavioral Health

## 2020-11-06 ENCOUNTER — Other Ambulatory Visit: Payer: Self-pay

## 2020-11-06 DIAGNOSIS — F1020 Alcohol dependence, uncomplicated: Secondary | ICD-10-CM | POA: Diagnosis not present

## 2020-11-06 NOTE — Progress Notes (Signed)
Eric Keller is a 35 year old male who presents to [facility] outpatient for a scheduled SAIOP intake appointment with this Clinical research associate. Client reports alcohol and benzodiazepines to be his drug(s) of choice. Client reports he recently completed a detox treatment at [facility]. He also denied any recent alcohol and/or substance use after discharge (11/04/2020). After being assessed during treatment, client was determined to meet criteria for ASAM Level 2.1 treatment. Willem is being recommended for Substance Abuse Intensive Outpatient Program [facility]). Client completed a person-centered treatment plan with this Clinical research associate. Client has agreed to attend SAIOP group therapy on Mondays, Wednesdays, and Fridays from 9:00am - 12:00pm. Group rules and group expectations were discussed with client during today's session. Client did not express any concerns regarding group rules and expectations. Clt denied SI/HI/AVH.  Triggers:  "Having a lot of free and unstructured time"  Probation/TASC: N/A  DWI Requirements:  N/A    SAIOP Start Date:  11/10/2020

## 2020-11-10 ENCOUNTER — Ambulatory Visit (INDEPENDENT_AMBULATORY_CARE_PROVIDER_SITE_OTHER): Payer: No Payment, Other | Admitting: Behavioral Health

## 2020-11-10 ENCOUNTER — Other Ambulatory Visit: Payer: Self-pay

## 2020-11-10 DIAGNOSIS — F1020 Alcohol dependence, uncomplicated: Secondary | ICD-10-CM | POA: Diagnosis not present

## 2020-11-10 NOTE — Group Note (Signed)
Group Topic: Cognitive Distortion  Group Date: 11/10/2020 Start Time:  9:00 AM End Time: 12:00 PM Facilitators: Mamie Nick, Counselor  Department: [facility]  Number of Participants: 3  Group Focus: acceptance, anxiety, check in, clarity of thought, communication, concentration, coping skills, depression, diagnosis education, dual diagnosis, family, feeling awareness/expression, forgiveness, impulsivity, leisure skills, personal responsibility, relapse prevention, and relaxation Treatment Modality:  Cognitive Behavioral Therapy Interventions utilized were clarification, confrontation, exploration, and group exercise Purpose: enhance coping skills, explore maladaptive thinking, express feelings, express irrational fears, improve communication skills, increase insight, regain self-worth, reinforce self-care, relapse prevention strategies, and trigger / craving management   Name: Eric Keller Date of Birth: March 06, 1986  MR: [id number]    Level of Participation: active Quality of Participation: attentive, cooperative and offered feedback Interactions with others: gave feedback Mood/Affect: appropriate Triggers (if applicable): N/A Cognition: coherent/clear and insightful Progress: Gaining insight Response: Today is client's first day attending SAIOP group therapy. Clt checked in by sharing "I just got out of the hospital after having suicidal thoughts. Emotionally I fell apart after losing my job. I got clean in 2015 from opiates for 4 years. These past few days I haven't used anything and I've been struggling". To stay sober he reports "I've just been trying to stay busy and tire myself out with various tasks". Clt was attentive and insightful in his verbal feedback. He appeared to be receptive to the advice and support offered to him during group discussion. Clt reports his faith and spiritual belief is a huge part of his life and he states lately it's been  difficult to return back to his church. He reports having a lot of fear and anxiety as it relates to being around "a lot of people". He shared that he currently lives with his father and his primary support is his family. Clt states he's looking for structure and support. Plan: follow-up needed  Patients Problems:  Patient Active Problem List   Diagnosis Date Noted  . Suicidal ideation 10/31/2020  . Nausea and vomiting 10/31/2020  . Alcohol withdrawal (HCC) 10/30/2020  . Major depressive disorder, recurrent episode, moderate (HCC) 08/08/2020  . Polysubstance dependence (HCC) 10/03/2019  . MDD (major depressive disorder), recurrent severe, without psychosis (HCC) 10/02/2019  . 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 Benzodiazepine withdrawal without complication (HCC) 07/22/2019  . Alcohol dependence with uncomplicated withdrawal (HCC) 07/19/2019  . LFT elevation   . Essential hypertension, benign 05/11/2018  . Flushing 05/11/2018  . Nonintractable headache 05/11/2018  . Erythrocytosis 05/11/2018  . Anxiety 04/14/2011  . Depression 04/14/2011     Family Program: Family present? No   Name of family member(s): N/A  UDS collected: No Results: N/A  AA/NA attended?: No  Sponsor?: No

## 2020-11-12 ENCOUNTER — Other Ambulatory Visit: Payer: Self-pay

## 2020-11-12 ENCOUNTER — Encounter (HOSPITAL_COMMUNITY): Payer: Self-pay | Admitting: Behavioral Health

## 2020-11-12 ENCOUNTER — Ambulatory Visit (INDEPENDENT_AMBULATORY_CARE_PROVIDER_SITE_OTHER): Payer: No Payment, Other | Admitting: Behavioral Health

## 2020-11-12 DIAGNOSIS — Z6372 Alcoholism and drug addiction in family: Secondary | ICD-10-CM | POA: Diagnosis not present

## 2020-11-12 DIAGNOSIS — F1020 Alcohol dependence, uncomplicated: Secondary | ICD-10-CM | POA: Diagnosis not present

## 2020-11-12 DIAGNOSIS — Z833 Family history of diabetes mellitus: Secondary | ICD-10-CM

## 2020-11-12 DIAGNOSIS — F1994 Other psychoactive substance use, unspecified with psychoactive substance-induced mood disorder: Secondary | ICD-10-CM

## 2020-11-12 DIAGNOSIS — F419 Anxiety disorder, unspecified: Secondary | ICD-10-CM

## 2020-11-12 DIAGNOSIS — I10 Essential (primary) hypertension: Secondary | ICD-10-CM

## 2020-11-12 DIAGNOSIS — F341 Dysthymic disorder: Secondary | ICD-10-CM | POA: Diagnosis not present

## 2020-11-12 DIAGNOSIS — F19980 Other psychoactive substance use, unspecified with psychoactive substance-induced anxiety disorder: Secondary | ICD-10-CM

## 2020-11-12 MED ORDER — PROPRANOLOL HCL 20 MG PO TABS
20.0000 mg | ORAL_TABLET | Freq: Three times a day (TID) | ORAL | 2 refills | Status: DC
  Filled 2020-11-12: qty 90, 30d supply, fill #0
  Filled 2020-12-10: qty 90, 30d supply, fill #1
  Filled 2021-01-08: qty 90, 30d supply, fill #2

## 2020-11-12 MED ORDER — BACLOFEN 10 MG PO TABS
10.0000 mg | ORAL_TABLET | Freq: Three times a day (TID) | ORAL | 1 refills | Status: DC
  Filled 2020-11-12: qty 90, 30d supply, fill #0
  Filled 2020-12-10: qty 90, 30d supply, fill #1

## 2020-11-12 MED ORDER — TRAZODONE HCL 100 MG PO TABS
100.0000 mg | ORAL_TABLET | Freq: Every day | ORAL | 2 refills | Status: DC
  Filled 2020-11-12: qty 30, 30d supply, fill #0
  Filled 2020-12-10: qty 30, 30d supply, fill #1
  Filled 2021-01-08: qty 30, 30d supply, fill #2

## 2020-11-12 MED ORDER — VITAMIN B COMPLEX PO TABS
1.0000 | ORAL_TABLET | Freq: Every day | ORAL | 3 refills | Status: DC
  Filled 2020-11-12: qty 90, 90d supply, fill #0
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
## 2020-11-12 NOTE — Progress Notes (Signed)
Duplicate discard

## 2020-11-14 ENCOUNTER — Other Ambulatory Visit: Payer: Self-pay

## 2020-11-14 ENCOUNTER — Ambulatory Visit (HOSPITAL_COMMUNITY): Payer: No Payment, Other | Admitting: Behavioral Health

## 2020-11-18 ENCOUNTER — Other Ambulatory Visit: Payer: Self-pay

## 2020-11-18 MED FILL — Gabapentin Cap 400 MG: ORAL | 30 days supply | Qty: 90 | Fill #1 | Status: AC

## 2020-11-19 ENCOUNTER — Ambulatory Visit (HOSPITAL_COMMUNITY): Payer: Self-pay | Admitting: Behavioral Health

## 2020-11-20 ENCOUNTER — Ambulatory Visit: Payer: Self-pay | Admitting: Internal Medicine

## 2020-11-20 ENCOUNTER — Other Ambulatory Visit: Payer: Self-pay

## 2020-11-20 ENCOUNTER — Telehealth (INDEPENDENT_AMBULATORY_CARE_PROVIDER_SITE_OTHER): Payer: Self-pay | Admitting: Internal Medicine

## 2020-11-20 DIAGNOSIS — F332 Major depressive disorder, recurrent severe without psychotic features: Secondary | ICD-10-CM

## 2020-11-20 DIAGNOSIS — Z09 Encounter for follow-up examination after completed treatment for conditions other than malignant neoplasm: Secondary | ICD-10-CM

## 2020-11-20 DIAGNOSIS — F10230 Alcohol dependence with withdrawal, uncomplicated: Secondary | ICD-10-CM

## 2020-11-20 DIAGNOSIS — F419 Anxiety disorder, unspecified: Secondary | ICD-10-CM

## 2020-11-20 NOTE — Progress Notes (Signed)
Hospital f/u Takes propanolol and metoprolol GAD-7  PHQ-9 Sees a psychiatrist

## 2020-11-20 NOTE — Progress Notes (Signed)
Virtual Visit via Telephone Note  I connected with Eric Keller, on 11/20/2020 at 1:40 PM by telephone due to the COVID-19 pandemic and verified that I am speaking with the correct person using two identifiers.   Consent: I discussed the limitations, risks, security and privacy concerns of performing an evaluation and management service by telephone and the availability of in person appointments. I also discussed with the patient that there may be a patient responsible charge related to this service. The patient expressed understanding and agreed to proceed.   Location of Patient: Home   Location of Provider: Clinic    Persons participating in Telemedicine visit: [facility] Dr. Earlene Plater      History of Present Illness: Patient has a visit for hospital dc follow up. Was admitted from 5/12-5/17 for alcohol abuse and withdrawal. Was discharged with Librium taper. He completed this taper after discharge. Also had passive SI in setting of MDD and GAD, psych was consulted. Patient reports that hospitalization to him was more related to mental issues. Says he was in "really dark place". Feels like he is now starting to come out of that. Has been off alcohol since hospitalization. Feels better physically related to that.   He has now started intensive outpatient therapy, MWF at the [facility]. This program is planned for 8+ weeks. Reports psych prescribed Propanolol for anxiety. They were apparently aware he already had Rx for Metoprolol. Was told to take prn for anxiety or around big crowd of people. He monitors his BP before taking a second Beta-blocker. Was also prescribed Baclofen prn and he usually takes this at nighttime. Other medications have stayed the same.   Depression screen [facility] 2/9 11/20/2020 09/03/2020 07/03/2020  Decreased Interest 1 1 3   Down, Depressed, Hopeless 1 3 3   PHQ - 2 Score 2 4 6   Altered sleeping 0 2 2  Tired, decreased energy 1 2 3   Change in  appetite 0 0 1  Feeling bad or failure about yourself  2 1 1   Trouble concentrating - 0 1  Moving slowly or fidgety/restless 0 - 0  Suicidal thoughts 0 0 1  PHQ-9 Score 5 9 15   Difficult doing work/chores - - Extremely dIfficult  Some encounter information is confidential and restricted. Go to Review Flowsheets activity to see all data.   GAD 7 : Generalized Anxiety Score 11/20/2020 09/03/2020 07/03/2020 05/27/2020  Nervous, Anxious, on Edge 3 3 3 2   Control/stop worrying 3 3 3 2   Worry too much - different things 3 3 3 2   Trouble relaxing 3 3 2 2   Restless 3 3 2 2   Easily annoyed or irritable 3 3 1 2   Afraid - awful might happen 0 2 2 2   Total GAD 7 Score 18 20 16 14   Anxiety Difficulty - - Extremely difficult Somewhat difficult    Past Medical History:  Diagnosis Date  . Alcohol withdrawal (HCC) 07/20/2019  . Anxiety   . Anxiety   . Depression   . H/O: substance abuse (HCC)   . Hypertension    No Known Allergies  Current Outpatient Medications on File Prior to Visit  Medication Sig Dispense Refill  . baclofen (LIORESAL) 10 MG tablet Take 1 tablet (10 mg total) by mouth 3 (three) times daily. 90 tablet 1  . cloNIDine (CATAPRES) 0.1 MG tablet TAKE 1 TABLET (0.1 MG TOTAL) BY MOUTH 3 (THREE) TIMES DAILY. (Patient taking differently: Take 0.1 mg by mouth 3 (three) times daily.) 90  tablet 2  . escitalopram (LEXAPRO) 10 MG tablet TAKE 2 TABLETS (20 MG TOTAL) BY MOUTH DAILY. (Patient taking differently: Take 20 mg by mouth daily.) 60 tablet 2  . gabapentin (NEURONTIN) 400 MG capsule TAKE 1 CAPSULE (400 MG TOTAL) BY MOUTH 3 (THREE) TIMES DAILY. (Patient taking differently: Take 400 mg by mouth 3 (three) times daily.) 270 capsule 1  . propranolol (INDERAL) 20 MG tablet Take 1 tablet (20 mg total) by mouth 3 (three) times daily. 90 tablet 2  . traZODone (DESYREL) 100 MG tablet Take 1 tablet (100 mg total) by mouth at bedtime. 30 tablet 2  . acetaminophen (TYLENOL) 500 MG tablet Take 500 mg  by mouth every 8 (eight) hours as needed for headache.    . B Complex Vitamins (VITAMIN B COMPLEX) TABS Take 1 tablet by mouth daily. (Patient not taking: Reported on 11/20/2020) 90 tablet 3  . diphenhydrAMINE (BENADRYL) 25 MG tablet Take 25 mg by mouth at bedtime as needed for allergies.    . metoprolol tartrate (LOPRESSOR) 50 MG tablet TAKE 1 TABLET (50 MG TOTAL) BY MOUTH 2 (TWO) TIMES DAILY. (Patient taking differently: Take 50 mg by mouth 2 (two) times daily.) 60 tablet 2   No current facility-administered medications on file prior to visit.    Observations/Objective: NAD. Speaking clearly.  Work of breathing normal.  Alert and oriented. Mood appropriate.   Assessment and Plan: 1. Hospital discharge follow-up Reviewed hospital course, current medications, ensured proper f/u in place, and addressed concerns.   2. Alcohol dependence with uncomplicated withdrawal (HCC) Has completed Librium taper. No recent alcohol use---sober for 2 weeks since hospitalization. Is completely outpatient intensive rehab.   3. MDD (major depressive disorder), recurrent severe, without psychosis (HCC) 4. Anxiety Currently in outpatient intensive rehab. Has psych follow up scheduled once per month as well. Medication changes as above in HPI.    Follow Up Instructions: One-two month f/u for polysubstance abuse, MDD, GAD    I discussed the assessment and treatment plan with the patient. The patient was provided an opportunity to ask questions and all were answered. The patient agreed with the plan and demonstrated an understanding of the instructions.   The patient was advised to call back or seek an in-person evaluation if the symptoms worsen or if the condition fails to improve as anticipated.     I provided 11 minutes total of non-face-to-face time during this encounter including median intraservice time, reviewing previous notes, investigations, ordering medications, medical decision making,  coordinating care and patient verbalized understanding at the end of the visit.    [name], D.O. Primary Care at [facility]  11/20/2020, 1:40 PM

## 2020-11-21 ENCOUNTER — Ambulatory Visit (HOSPITAL_COMMUNITY): Payer: Self-pay

## 2020-11-21 ENCOUNTER — Telehealth (HOSPITAL_COMMUNITY): Payer: Self-pay | Admitting: Behavioral Health

## 2020-11-24 ENCOUNTER — Ambulatory Visit (HOSPITAL_COMMUNITY): Payer: No Payment, Other | Admitting: Behavioral Health

## 2020-11-24 ENCOUNTER — Other Ambulatory Visit: Payer: Self-pay

## 2020-11-26 ENCOUNTER — Other Ambulatory Visit: Payer: Self-pay

## 2020-11-26 ENCOUNTER — Ambulatory Visit (HOSPITAL_COMMUNITY): Payer: No Payment, Other | Admitting: Behavioral Health

## 2020-11-28 ENCOUNTER — Other Ambulatory Visit: Payer: Self-pay

## 2020-11-28 ENCOUNTER — Ambulatory Visit (INDEPENDENT_AMBULATORY_CARE_PROVIDER_SITE_OTHER): Payer: No Payment, Other | Admitting: Licensed Clinical Social Worker

## 2020-11-28 ENCOUNTER — Telehealth (HOSPITAL_COMMUNITY): Payer: Self-pay | Admitting: Medical

## 2020-11-28 ENCOUNTER — Encounter (HOSPITAL_COMMUNITY): Payer: Self-pay

## 2020-11-28 DIAGNOSIS — F419 Anxiety disorder, unspecified: Secondary | ICD-10-CM

## 2020-11-28 DIAGNOSIS — K2920 Alcoholic gastritis without bleeding: Secondary | ICD-10-CM

## 2020-11-28 DIAGNOSIS — T50905A Adverse effect of unspecified drugs, medicaments and biological substances, initial encounter: Secondary | ICD-10-CM

## 2020-11-28 DIAGNOSIS — F418 Other specified anxiety disorders: Secondary | ICD-10-CM

## 2020-11-28 DIAGNOSIS — I10 Essential (primary) hypertension: Secondary | ICD-10-CM

## 2020-11-28 DIAGNOSIS — F341 Dysthymic disorder: Secondary | ICD-10-CM

## 2020-11-28 DIAGNOSIS — Z833 Family history of diabetes mellitus: Secondary | ICD-10-CM

## 2020-11-28 DIAGNOSIS — F19980 Other psychoactive substance use, unspecified with psychoactive substance-induced anxiety disorder: Secondary | ICD-10-CM

## 2020-11-28 DIAGNOSIS — F1020 Alcohol dependence, uncomplicated: Secondary | ICD-10-CM | POA: Diagnosis not present

## 2020-11-28 DIAGNOSIS — Z6372 Alcoholism and drug addiction in family: Secondary | ICD-10-CM

## 2020-11-28 DIAGNOSIS — F1994 Other psychoactive substance use, unspecified with psychoactive substance-induced mood disorder: Secondary | ICD-10-CM

## 2020-11-28 DIAGNOSIS — F1920 Other psychoactive substance dependence, uncomplicated: Secondary | ICD-10-CM

## 2020-11-28 MED ORDER — ONDANSETRON 8 MG PO TBDP
8.0000 mg | ORAL_TABLET | Freq: Three times a day (TID) | ORAL | 0 refills | Status: DC | PRN
  Filled 2020-11-28: qty 20, 7d supply, fill #0

## 2020-11-28 MED ORDER — FAMOTIDINE 40 MG PO TABS
40.0000 mg | ORAL_TABLET | Freq: Every evening | ORAL | 1 refills | Status: DC
  Filled 2020-11-28: qty 30, 30d supply, fill #0

## 2020-11-28 NOTE — Progress Notes (Signed)
Patient ID: Eric Keller, male   DOB: 1985-09-18, 35 y.o.   MRN: [id number]  S-Pt returned to group after missing group since initial visit. He has no vehicle and transportation is a problem. He reports medications prescribed are helpful but he h is having indigestion and nausa after taking his Baclofen. He does have apast history of indigestion even before he started drinking. Symptoms are a mixture of acid indigestion and nausea. He has tried taking with food but this does not hel.  O-Alert NAD Oriented Comprehensible  A-Gastric sensitivity to Baclofen tablet with history of Chronic gastritis  P- Rx Pepcid 40 mg.and Zofran 8mg  ODT PRN FU next week

## 2020-11-30 NOTE — Progress Notes (Signed)
    Daily Group Progress Note  Program: CD-IOP   Group Time: 9am-10:30am    Participation Level: Active  Behavioral Response: Appropriate and Sharing  Type of Therapy: Process Group  Topic: Clinician checked in with group members, assessing for SI/HI/psychosis and overall level of functioning, including cravings or triggers and coping skills used to deal with uncomfortable feelings. Clinician inquired about attendance at community support meetings and connection with sober support system. Clinician and group members discussed what went well and challenges to sobriety. Clinician facilitated group processing of triggers and relapses over the weekend and common struggles in early sobriety, such as wanting the instant gratification, acceptance of physical/mental/emotional states sober in comparison to expectations and related disappointments or resentments   Group Time: 10:30am-12pm  Participation Level: Active  Behavioral Response: Appropriate and Sharing  Type of Therapy: Psycho-education Group  Topic: Clinician presented the psycho-educational topic of Adult Children of Alcoholics (ACOA). Clinician reviewed with clients the 'Laundry List' and common thought and behavior patterns of families with substance use in the home.    Summary: Client checked in with sobriety date initially prior to hospitalization on 10/23/20 but reported slip on Wednesday with alcohol due to known way to  decrease intensity of uncomfortable emotions. Client processed difficulty with managing intensity of emotions in early recovery and lack of comfort with alternative skills. Client reported 7/10 depression and 9/10 anxiety insensitive with 10 being worse. Client was receptive to psycho-education and supportive of other group members, reporting a strength of active listening.   Family Program: Family present? No   Name of family member(s): NA  UDS collected: No Results: NA  AA/NA attended?: YesWednesday and  Thursday  Sponsor?: No   Harlon Ditty, LCSW

## 2020-12-01 ENCOUNTER — Ambulatory Visit (HOSPITAL_COMMUNITY): Payer: Self-pay

## 2020-12-01 ENCOUNTER — Telehealth (HOSPITAL_COMMUNITY): Payer: No Payment, Other

## 2020-12-01 ENCOUNTER — Other Ambulatory Visit: Payer: Self-pay

## 2020-12-03 ENCOUNTER — Ambulatory Visit (HOSPITAL_COMMUNITY): Payer: Self-pay

## 2020-12-05 ENCOUNTER — Other Ambulatory Visit: Payer: Self-pay

## 2020-12-05 ENCOUNTER — Ambulatory Visit (HOSPITAL_COMMUNITY): Payer: Self-pay

## 2020-12-08 ENCOUNTER — Ambulatory Visit (HOSPITAL_COMMUNITY): Payer: Self-pay | Admitting: Behavioral Health

## 2020-12-10 ENCOUNTER — Other Ambulatory Visit: Payer: Self-pay

## 2020-12-10 ENCOUNTER — Ambulatory Visit (HOSPITAL_COMMUNITY): Payer: Self-pay | Admitting: Behavioral Health

## 2020-12-10 MED FILL — Clonidine HCl Tab 0.1 MG: ORAL | 30 days supply | Qty: 90 | Fill #1 | Status: AC

## 2020-12-10 MED FILL — Escitalopram Oxalate Tab 10 MG (Base Equiv): ORAL | 30 days supply | Qty: 60 | Fill #1 | Status: AC

## 2020-12-12 ENCOUNTER — Ambulatory Visit (HOSPITAL_COMMUNITY): Payer: Self-pay

## 2020-12-15 ENCOUNTER — Ambulatory Visit (HOSPITAL_COMMUNITY): Payer: Self-pay

## 2020-12-17 ENCOUNTER — Ambulatory Visit (HOSPITAL_COMMUNITY): Payer: Self-pay

## 2020-12-17 ENCOUNTER — Other Ambulatory Visit: Payer: Self-pay

## 2020-12-17 MED FILL — Gabapentin Cap 400 MG: ORAL | 30 days supply | Qty: 90 | Fill #2 | Status: AC

## 2020-12-19 ENCOUNTER — Ambulatory Visit (HOSPITAL_COMMUNITY): Payer: Self-pay

## 2021-01-05 ENCOUNTER — Ambulatory Visit (HOSPITAL_COMMUNITY)
Admission: EM | Admit: 2021-01-05 | Discharge: 2021-01-05 | Disposition: A | Discharge status: Home / Self Care | Payer: Self-pay | Attending: Urgent Care | Admitting: Urgent Care

## 2021-01-05 ENCOUNTER — Other Ambulatory Visit: Payer: Self-pay

## 2021-01-05 ENCOUNTER — Encounter (HOSPITAL_COMMUNITY): Payer: Self-pay

## 2021-01-05 DIAGNOSIS — J019 Acute sinusitis, unspecified: Secondary | ICD-10-CM

## 2021-01-05 DIAGNOSIS — R059 Cough, unspecified: Secondary | ICD-10-CM

## 2021-01-05 DIAGNOSIS — H6123 Impacted cerumen, bilateral: Secondary | ICD-10-CM

## 2021-01-05 MED ORDER — CARBAMIDE PEROXIDE 6.5 % OT SOLN
5.0000 [drp] | Freq: Two times a day (BID) | OTIC | 0 refills | Status: DC
  Filled 2021-01-05: qty 15, 30d supply, fill #0

## 2021-01-05 MED ORDER — BENZONATATE 100 MG PO CAPS
100.0000 mg | ORAL_CAPSULE | Freq: Three times a day (TID) | ORAL | 0 refills | Status: DC | PRN
  Filled 2021-01-05: qty 60, 10d supply, fill #0

## 2021-01-05 MED ORDER — PSEUDOEPHEDRINE HCL 60 MG PO TABS
60.0000 mg | ORAL_TABLET | Freq: Three times a day (TID) | ORAL | 0 refills | Status: DC | PRN
  Filled 2021-01-05: qty 30, 10d supply, fill #0

## 2021-01-05 MED ORDER — PROMETHAZINE-DM 6.25-15 MG/5ML PO SYRP
5.0000 mL | ORAL_SOLUTION | Freq: Every evening | ORAL | 0 refills | Status: DC | PRN
  Filled 2021-01-05: qty 100, 20d supply, fill #0

## 2021-01-05 MED ORDER — AMOXICILLIN 875 MG PO TABS
875.0000 mg | ORAL_TABLET | Freq: Two times a day (BID) | ORAL | 0 refills | Status: DC
  Filled 2021-01-05: qty 14, 7d supply, fill #0

## 2021-01-05 MED ORDER — CETIRIZINE HCL 10 MG PO TABS
10.0000 mg | ORAL_TABLET | Freq: Every day | ORAL | 0 refills | Status: DC
  Filled 2021-01-05: qty 30, 30d supply, fill #0

## 2021-01-05 NOTE — ED Triage Notes (Signed)
Pt reports bilateral ear pain x 1  1/2 week; cough, chills, congestion and body aches x 1 week.

## 2021-01-05 NOTE — ED Provider Notes (Signed)
[facility]   MRN: [id number] DOB: Aug 18, 1985  Subjective:   Eric Keller is a 35 y.o. male presenting for 10-day history of acute onset malaise, chills.  Symptoms initially started out with a cough and chest congestion and have since progressed to bilateral ear pain, sinus headaches with persistent fatigue and malaise, chills.  Cough has improved and is mild now.  He took a COVID-19 test already and was negative.  Does not want a repeat this.  Denies any active chest pain, shortness of breath.  Denies history of asthma, COPD.  Patient is not a smoker.  No current facility-administered medications for this encounter.  Current Outpatient Medications:    acetaminophen (TYLENOL) 500 MG tablet, Take 500 mg by mouth every 8 (eight) hours as needed for headache., Disp: , Rfl:    B Complex Vitamins (VITAMIN B COMPLEX) TABS, Take 1 tablet by mouth daily. (Patient not taking: No sig reported), Disp: 90 tablet, Rfl: 3   baclofen (LIORESAL) 10 MG tablet, Take 1 tablet (10 mg total) by mouth 3 (three) times daily., Disp: 90 tablet, Rfl: 1   cloNIDine (CATAPRES) 0.1 MG tablet, TAKE 1 TABLET (0.1 MG TOTAL) BY MOUTH 3 (THREE) TIMES DAILY. (Patient taking differently: Take 0.1 mg by mouth 3 (three) times daily.), Disp: 90 tablet, Rfl: 2   diphenhydrAMINE (BENADRYL) 25 MG tablet, Take 25 mg by mouth at bedtime as needed for allergies., Disp: , Rfl:    escitalopram (LEXAPRO) 10 MG tablet, TAKE 2 TABLETS (20 MG TOTAL) BY MOUTH DAILY. (Patient taking differently: Take 20 mg by mouth daily.), Disp: 60 tablet, Rfl: 2   famotidine (PEPCID) 40 MG tablet, Take 1 tablet (40 mg total) by mouth every evening., Disp: 30 tablet, Rfl: 1   gabapentin (NEURONTIN) 400 MG capsule, TAKE 1 CAPSULE (400 MG TOTAL) BY MOUTH 3 (THREE) TIMES DAILY. (Patient taking differently: Take 400 mg by mouth 3 (three) times daily.), Disp: 270 capsule, Rfl: 1   metoprolol tartrate (LOPRESSOR) 50 MG tablet, TAKE 1 TABLET (50 MG  TOTAL) BY MOUTH 2 (TWO) TIMES DAILY. (Patient taking differently: Take 50 mg by mouth 2 (two) times daily.), Disp: 60 tablet, Rfl: 2   ondansetron (ZOFRAN-ODT) 8 MG disintegrating tablet, Take 1 tablet (8 mg total) by mouth every 8 (eight) hours as needed for nausea or vomiting., Disp: 20 tablet, Rfl: 0   propranolol (INDERAL) 20 MG tablet, Take 1 tablet (20 mg total) by mouth 3 (three) times daily., Disp: 90 tablet, Rfl: 2   traZODone (DESYREL) 100 MG tablet, Take 1 tablet (100 mg total) by mouth at bedtime., Disp: 30 tablet, Rfl: 2   No Known Allergies  Past Medical History:  Diagnosis Date   Alcohol withdrawal (HCC) 07/20/2019   Anxiety    Anxiety    Depression    H/O: substance abuse (HCC)    Hypertension      Past Surgical History:  Procedure Laterality Date   WISDOM TOOTH EXTRACTION      Family History  Problem Relation Age of Onset   Cancer Mother    Hypertension Paternal Grandfather    Heart disease Paternal Grandfather        MIs   Heart disease Other    Stroke Other     Social History   Tobacco Use   Smoking status: Never   Smokeless tobacco: Never  Vaping Use   Vaping Use: Never used  Substance Use Topics   Alcohol use: Yes    Comment:  daily 5th of liquor   Drug use: Not Currently    Types: Marijuana    Comment: Vicodin Abuse, BEnzos    ROS   Objective:   Vitals: BP 131/84 (BP Location: Right Arm)   Pulse 60   Temp 97.7 F (36.5 C) (Oral)   Resp 18   SpO2 100%   Physical Exam Constitutional:      General: He is not in acute distress.    Appearance: Normal appearance. He is well-developed and normal weight. He is not ill-appearing, toxic-appearing or diaphoretic.  HENT:     Head: Normocephalic and atraumatic.     Right Ear: External ear normal. There is impacted cerumen.     Left Ear: External ear normal. There is impacted cerumen.     Nose: Nose normal. No congestion or rhinorrhea.     Mouth/Throat:     Mouth: Mucous membranes are  moist.     Pharynx: Posterior oropharyngeal erythema present. No oropharyngeal exudate.     Comments: Significant postnasal drainage overlying pharynx with associated erythema. Eyes:     General: No scleral icterus.       Right eye: No discharge.        Left eye: No discharge.     Extraocular Movements: Extraocular movements intact.     Conjunctiva/sclera: Conjunctivae normal.     Pupils: Pupils are equal, round, and reactive to light.  Cardiovascular:     Rate and Rhythm: Normal rate and regular rhythm.     Heart sounds: Normal heart sounds. No murmur heard.   No friction rub. No gallop.  Pulmonary:     Effort: Pulmonary effort is normal. No respiratory distress.     Breath sounds: Normal breath sounds. No stridor. No wheezing, rhonchi or rales.  Musculoskeletal:     Cervical back: Normal range of motion and neck supple. No rigidity. No muscular tenderness.  Neurological:     General: No focal deficit present.     Mental Status: He is alert and oriented to person, place, and time.     Cranial Nerves: No cranial nerve deficit.     Motor: No weakness.     Coordination: Coordination normal.     Gait: Gait normal.     Deep Tendon Reflexes: Reflexes normal.  Psychiatric:        Mood and Affect: Mood normal.        Behavior: Behavior normal.        Thought Content: Thought content normal.    Ear lavage performed using mixture of peroxide and water.  Pressure irrigation performed using a bottle and a thin ear tube.  Bilateral ear lavage, curette was also used.   Assessment and Plan :   PDMP not reviewed this encounter.  1. Acute non-recurrent sinusitis, unspecified location   2. Cough   3. Bilateral impacted cerumen     Will start empiric treatment for sinusitis with amoxicillin.  Recommended supportive care otherwise including the use of oral antihistamine, decongestant. Ear lavage performed successfully, stop using Q-tips. Provided with script for Debrox prn. Counseled  patient on potential for adverse effects with medications prescribed/recommended today, ER and return-to-clinic precautions discussed, patient verbalized understanding.    Wallis Bamberg, PA-C 01/05/21 1719

## 2021-01-06 ENCOUNTER — Other Ambulatory Visit: Payer: Self-pay

## 2021-01-08 ENCOUNTER — Other Ambulatory Visit: Payer: Self-pay | Admitting: Family Medicine

## 2021-01-08 ENCOUNTER — Other Ambulatory Visit: Payer: Self-pay

## 2021-01-08 DIAGNOSIS — F1920 Other psychoactive substance dependence, uncomplicated: Secondary | ICD-10-CM

## 2021-01-12 ENCOUNTER — Other Ambulatory Visit: Payer: Self-pay

## 2021-01-14 ENCOUNTER — Encounter (HOSPITAL_COMMUNITY): Payer: Self-pay | Admitting: Behavioral Health

## 2021-01-15 ENCOUNTER — Other Ambulatory Visit: Payer: Self-pay

## 2021-01-15 MED FILL — Gabapentin Cap 400 MG: ORAL | 30 days supply | Qty: 90 | Fill #3 | Status: AC

## 2021-01-20 ENCOUNTER — Other Ambulatory Visit: Payer: Self-pay

## 2021-02-02 ENCOUNTER — Other Ambulatory Visit: Payer: Self-pay

## 2021-02-02 ENCOUNTER — Other Ambulatory Visit (HOSPITAL_COMMUNITY): Payer: Self-pay | Admitting: Psychiatry

## 2021-02-02 ENCOUNTER — Other Ambulatory Visit (HOSPITAL_COMMUNITY): Payer: Self-pay | Admitting: Medical

## 2021-02-02 MED FILL — Metoprolol Tartrate Tab 50 MG: ORAL | 30 days supply | Qty: 60 | Fill #1 | Status: AC

## 2021-02-03 ENCOUNTER — Ambulatory Visit (INDEPENDENT_AMBULATORY_CARE_PROVIDER_SITE_OTHER): Payer: Self-pay | Admitting: Family Medicine

## 2021-02-03 ENCOUNTER — Other Ambulatory Visit: Payer: Self-pay

## 2021-02-03 ENCOUNTER — Encounter: Payer: Self-pay | Admitting: Family Medicine

## 2021-02-03 VITALS — BP 137/92 | HR 66 | Temp 96.4°F | Resp 16 | Ht 74.0 in | Wt 208.0 lb

## 2021-02-03 DIAGNOSIS — F419 Anxiety disorder, unspecified: Secondary | ICD-10-CM

## 2021-02-03 DIAGNOSIS — G4700 Insomnia, unspecified: Secondary | ICD-10-CM

## 2021-02-03 DIAGNOSIS — I10 Essential (primary) hypertension: Secondary | ICD-10-CM

## 2021-02-03 DIAGNOSIS — F1920 Other psychoactive substance dependence, uncomplicated: Secondary | ICD-10-CM

## 2021-02-03 MED ORDER — CLONIDINE HCL 0.1 MG PO TABS
ORAL_TABLET | ORAL | 2 refills | Status: DC
  Filled 2021-02-03: qty 90, 30d supply, fill #0
  Filled 2021-03-06: qty 90, 30d supply, fill #1
  Filled 2021-04-08: qty 90, 30d supply, fill #2

## 2021-02-03 MED ORDER — PROPRANOLOL HCL 20 MG PO TABS
20.0000 mg | ORAL_TABLET | Freq: Three times a day (TID) | ORAL | 2 refills | Status: DC
  Filled 2021-02-03: qty 90, 30d supply, fill #0

## 2021-02-03 MED ORDER — ESCITALOPRAM OXALATE 10 MG PO TABS
ORAL_TABLET | Freq: Every day | ORAL | 2 refills | Status: DC
  Filled 2021-02-03: qty 60, 30d supply, fill #0
  Filled 2021-03-06: qty 60, 30d supply, fill #1
  Filled 2021-04-08: qty 60, 30d supply, fill #2

## 2021-02-03 MED ORDER — GABAPENTIN 400 MG PO CAPS
ORAL_CAPSULE | ORAL | 1 refills | Status: DC
  Filled 2021-02-03: qty 270, fill #0
  Filled 2021-02-11: qty 90, 30d supply, fill #0
  Filled 2021-03-13: qty 90, 30d supply, fill #1
  Filled 2021-04-15: qty 90, 30d supply, fill #2
  Filled 2021-05-13: qty 90, 30d supply, fill #3

## 2021-02-03 MED ORDER — TRAZODONE HCL 100 MG PO TABS
100.0000 mg | ORAL_TABLET | Freq: Every day | ORAL | 2 refills | Status: DC
  Filled 2021-02-03: qty 30, 30d supply, fill #0
  Filled 2021-02-27: qty 30, 30d supply, fill #1
  Filled 2021-03-26: qty 30, 30d supply, fill #2

## 2021-02-03 NOTE — Progress Notes (Signed)
Established Patient Office Visit  Subjective:  Patient ID: Eric Keller, male    DOB: [birth year]  Age: 35 y.o. MRN: [id number]  CC:  Chief Complaint  Patient presents with   Medication Refill    HPI Eric Keller presents for follow up of chronic med issues including hypertension and anxiety and for refills of meds. He reports that he has been out of his meds for several days. He denies acute complaints or concerns.   Past Medical History:  Diagnosis Date   Alcohol withdrawal (HCC) 07/20/2019   Anxiety    Anxiety    Depression    H/O: substance abuse (HCC)    Hypertension     Past Surgical History:  Procedure Laterality Date   WISDOM TOOTH EXTRACTION      Family History  Problem Relation Age of Onset   Cancer Mother    Hypertension Paternal Grandfather    Heart disease Paternal Grandfather        MIs   Heart disease Other    Stroke Other     Social History   Socioeconomic History   Marital status: Single    Spouse name: Not on file   Number of children: Not on file   Years of education: Not on file   Highest education level: Not on file  Occupational History   Not on file  Tobacco Use   Smoking status: Never    Passive exposure: Past   Smokeless tobacco: Never  Vaping Use   Vaping Use: Never used  Substance and Sexual Activity   Alcohol use: Yes    Comment: daily 5th of liquor   Drug use: Not Currently    Types: Marijuana    Comment: Vicodin Abuse, BEnzos   Sexual activity: Not on file  Other Topics Concern   Not on file  Social History Narrative   Not on file   Social Determinants of Health   Financial Resource Strain: Not on file  Food Insecurity: Not on file  Transportation Needs: Not on file  Physical Activity: Not on file  Stress: Not on file  Social Connections: Not on file  Intimate Partner Violence: Not on file    Outpatient Medications Prior to Visit  Medication Sig Dispense Refill   baclofen (LIORESAL) 10 MG tablet Take 1  tablet (10 mg total) by mouth 3 (three) times daily. 90 tablet 1   cloNIDine (CATAPRES) 0.1 MG tablet TAKE 1 TABLET (0.1 MG TOTAL) BY MOUTH 3 (THREE) TIMES DAILY. (Patient taking differently: Take 0.1 mg by mouth 3 (three) times daily.) 90 tablet 2   diphenhydrAMINE (BENADRYL) 25 MG tablet Take 25 mg by mouth at bedtime as needed for allergies.     escitalopram (LEXAPRO) 10 MG tablet TAKE 2 TABLETS (20 MG TOTAL) BY MOUTH DAILY. (Patient taking differently: Take 20 mg by mouth daily.) 60 tablet 2   famotidine (PEPCID) 40 MG tablet Take 1 tablet (40 mg total) by mouth every evening. 30 tablet 1   gabapentin (NEURONTIN) 400 MG capsule TAKE 1 CAPSULE (400 MG TOTAL) BY MOUTH 3 (THREE) TIMES DAILY. (Patient taking differently: Take 400 mg by mouth 3 (three) times daily.) 270 capsule 1   metoprolol tartrate (LOPRESSOR) 50 MG tablet TAKE 1 TABLET (50 MG TOTAL) BY MOUTH 2 (TWO) TIMES DAILY. (Patient taking differently: Take 50 mg by mouth 2 (two) times daily.) 60 tablet 2   propranolol (INDERAL) 20 MG tablet Take 1 tablet (20 mg total) by mouth 3 (three) times daily. 90  tablet 2   traZODone (DESYREL) 100 MG tablet Take 1 tablet (100 mg total) by mouth at bedtime. 30 tablet 2   cetirizine (ZYRTEC ALLERGY) 10 MG tablet Take 1 tablet (10 mg total) by mouth daily. (Patient not taking: Reported on 02/03/2021) 30 tablet 0   acetaminophen (TYLENOL) 500 MG tablet Take 500 mg by mouth every 8 (eight) hours as needed for headache. (Patient not taking: Reported on 02/03/2021)     amoxicillin (AMOXIL) 875 MG tablet Take 1 tablet (875 mg total) by mouth 2 (two) times daily. (Patient not taking: Reported on 02/03/2021) 14 tablet 0   B Complex Vitamins (VITAMIN B COMPLEX) TABS Take 1 tablet by mouth daily. (Patient not taking: No sig reported) 90 tablet 3   benzonatate (TESSALON) 100 MG capsule Take 1-2 capsules (100-200 mg total) by mouth 3 (three) times daily as needed. (Patient not taking: Reported on 02/03/2021) 60 capsule 0    carbamide peroxide (DEBROX) 6.5 % OTIC solution Place 5 drops into both ears 2 (two) times daily. (Patient not taking: Reported on 02/03/2021) 15 mL 0   ondansetron (ZOFRAN-ODT) 8 MG disintegrating tablet Take 1 tablet (8 mg total) by mouth every 8 (eight) hours as needed for nausea or vomiting. (Patient not taking: Reported on 02/03/2021) 20 tablet 0   promethazine-dextromethorphan (PROMETHAZINE-DM) 6.25-15 MG/5ML syrup Take 5 mLs by mouth at bedtime as needed for cough. (Patient not taking: Reported on 02/03/2021) 100 mL 0   pseudoephedrine (SUDAFED) 60 MG tablet Take 1 tablet (60 mg total) by mouth every 8 (eight) hours as needed for congestion. (Patient not taking: Reported on 02/03/2021) 30 tablet 0   No facility-administered medications prior to visit.    No Known Allergies  ROS Review of Systems  Psychiatric/Behavioral:  Positive for sleep disturbance. Negative for self-injury and suicidal ideas. The patient is nervous/anxious.   All other systems reviewed and are negative.    Objective:    Physical Exam Vitals and nursing note reviewed.  Constitutional:      General: He is not in acute distress. Cardiovascular:     Rate and Rhythm: Normal rate and regular rhythm.  Pulmonary:     Effort: Pulmonary effort is normal.     Breath sounds: Normal breath sounds.  Abdominal:     Palpations: Abdomen is soft.     Tenderness: There is no abdominal tenderness.  Musculoskeletal:     Right lower leg: No edema.     Left lower leg: No edema.  Neurological:     General: No focal deficit present.     Mental Status: He is alert and oriented to person, place, and time.  Psychiatric:        Mood and Affect: Mood and affect normal.        Behavior: Behavior normal. Behavior is cooperative.    BP (!) 137/92 (BP Location: Right Arm, Patient Position: Sitting, Cuff Size: Large)   Pulse 66   Temp (!) 96.4 F (35.8 C)   Resp 16   Ht 6\' 2"  (1.88 m)   Wt 208 lb (94.3 kg)   SpO2 98%   BMI  26.71 kg/m  Wt Readings from Last 3 Encounters:  02/03/21 208 lb (94.3 kg)  06/13/20 220 lb (99.8 kg)  11/22/19 226 lb (102.5 kg)     Health Maintenance Due  Topic Date Due   COVID-19 Vaccine (1) Never done   Pneumococcal Vaccine 25-56 Years old (1 - PCV) Never done   INFLUENZA VACCINE  01/19/2021  There are no preventive care reminders to display for this patient.       Assessment & Plan:   Problem List Items Addressed This Visit   None 1. Anxiety Discussed compliance - meds refilled  - escitalopram (LEXAPRO) 10 MG tablet; TAKE 2 TABLETS (20 MG TOTAL) BY MOUTH DAILY.  Dispense: 60 tablet; Refill: 2 - gabapentin (NEURONTIN) 400 MG capsule; TAKE 1 CAPSULE (400 MG TOTAL) BY MOUTH 3 (THREE) TIMES DAILY.  Dispense: 270 capsule; Refill: 1  2. Essential hypertension, benign Discussed compliance - meds refilled  - propranolol (INDERAL) 20 MG tablet; Take 1 tablet (20 mg total) by mouth 3 (three) times daily.  Dispense: 90 tablet; Refill: 2  3. Polysubstance dependence including opioid type drug with complication, continuous use (HCC)  - cloNIDine (CATAPRES) 0.1 MG tablet; TAKE 1 TABLET (0.1 MG TOTAL) BY MOUTH 3 (THREE) TIMES DAILY.  Dispense: 90 tablet; Refill: 2  4. Insomnia, unspecified type Continue present management-meds refilled  - traZODone (DESYREL) 100 MG tablet; Take 1 tablet (100 mg total) by mouth at bedtime.  Dispense: 30 tablet; Refill: 2     Follow-up: Return in about 3 months (around 05/06/2021) for follow up, chronic med issues.    [name], MD

## 2021-02-03 NOTE — Progress Notes (Signed)
RF- trazadone Clonidine- out x 1 week Propanolol Gabapentin Lexapro- off med x 4 days  Taking prozac x 4 days from previous Rx

## 2021-02-04 ENCOUNTER — Other Ambulatory Visit: Payer: Self-pay

## 2021-02-11 ENCOUNTER — Other Ambulatory Visit: Payer: Self-pay

## 2021-02-27 ENCOUNTER — Other Ambulatory Visit: Payer: Self-pay

## 2021-03-06 ENCOUNTER — Other Ambulatory Visit: Payer: Self-pay

## 2021-03-06 ENCOUNTER — Other Ambulatory Visit: Payer: Self-pay | Admitting: Internal Medicine

## 2021-03-06 DIAGNOSIS — I10 Essential (primary) hypertension: Secondary | ICD-10-CM

## 2021-03-06 MED ORDER — METOPROLOL TARTRATE 50 MG PO TABS
ORAL_TABLET | Freq: Two times a day (BID) | ORAL | 2 refills | Status: DC
  Filled 2021-03-06: qty 60, 30d supply, fill #0
  Filled 2021-04-08: qty 60, 30d supply, fill #1
  Filled 2021-05-08: qty 60, 30d supply, fill #2

## 2021-03-09 ENCOUNTER — Other Ambulatory Visit: Payer: Self-pay

## 2021-03-13 ENCOUNTER — Other Ambulatory Visit: Payer: Self-pay

## 2021-03-26 ENCOUNTER — Other Ambulatory Visit: Payer: Self-pay

## 2021-04-08 ENCOUNTER — Other Ambulatory Visit: Payer: Self-pay

## 2021-04-15 ENCOUNTER — Other Ambulatory Visit: Payer: Self-pay

## 2021-04-20 ENCOUNTER — Other Ambulatory Visit: Payer: Self-pay | Admitting: Family Medicine

## 2021-04-20 ENCOUNTER — Other Ambulatory Visit: Payer: Self-pay

## 2021-04-20 DIAGNOSIS — G4700 Insomnia, unspecified: Secondary | ICD-10-CM

## 2021-04-21 ENCOUNTER — Telehealth: Payer: Self-pay | Admitting: Family Medicine

## 2021-04-21 ENCOUNTER — Other Ambulatory Visit: Payer: Self-pay

## 2021-04-21 NOTE — Telephone Encounter (Signed)
1) Medication(s) Requested (by name):  traZODone (DESYREL) 100 MG tablet  2) Pharmacy of Choice:  [facility]   3) Special Requests:   Approved medications will be sent to the pharmacy, we will reach out if there is an issue.  Requests made after 3pm may not be addressed until the following business day!  If a patient is unsure of the name of the medication(s) please note and ask patient to call back when they are able to provide all info, do not send to responsible party until all information is available!

## 2021-04-22 ENCOUNTER — Other Ambulatory Visit: Payer: Self-pay | Admitting: Family Medicine

## 2021-04-22 ENCOUNTER — Other Ambulatory Visit: Payer: Self-pay

## 2021-04-22 DIAGNOSIS — G4700 Insomnia, unspecified: Secondary | ICD-10-CM

## 2021-04-23 ENCOUNTER — Other Ambulatory Visit: Payer: Self-pay

## 2021-04-24 ENCOUNTER — Other Ambulatory Visit: Payer: Self-pay | Admitting: Family Medicine

## 2021-04-24 ENCOUNTER — Other Ambulatory Visit: Payer: Self-pay

## 2021-04-24 DIAGNOSIS — G4700 Insomnia, unspecified: Secondary | ICD-10-CM

## 2021-04-24 MED ORDER — TRAZODONE HCL 100 MG PO TABS
100.0000 mg | ORAL_TABLET | Freq: Every day | ORAL | 0 refills | Status: DC
  Filled 2021-04-24: qty 30, 30d supply, fill #0

## 2021-04-27 ENCOUNTER — Other Ambulatory Visit: Payer: Self-pay

## 2021-05-06 ENCOUNTER — Ambulatory Visit: Payer: Self-pay | Admitting: Family Medicine

## 2021-05-08 ENCOUNTER — Telehealth: Payer: Self-pay | Admitting: Family Medicine

## 2021-05-08 ENCOUNTER — Other Ambulatory Visit: Payer: Self-pay | Admitting: *Deleted

## 2021-05-08 ENCOUNTER — Other Ambulatory Visit: Payer: Self-pay | Admitting: Family Medicine

## 2021-05-08 ENCOUNTER — Other Ambulatory Visit: Payer: Self-pay

## 2021-05-08 DIAGNOSIS — F1920 Other psychoactive substance dependence, uncomplicated: Secondary | ICD-10-CM

## 2021-05-08 DIAGNOSIS — I10 Essential (primary) hypertension: Secondary | ICD-10-CM

## 2021-05-08 MED ORDER — METOPROLOL TARTRATE 50 MG PO TABS: ORAL_TABLET | Freq: Two times a day (BID) | ORAL | 2 refills | Status: DC

## 2021-05-08 MED ORDER — CLONIDINE HCL 0.1 MG PO TABS
ORAL_TABLET | ORAL | 2 refills | Status: DC
  Filled 2021-05-08: qty 90, 30d supply, fill #0

## 2021-05-08 NOTE — Telephone Encounter (Signed)
Rx #: [id number]  metoprolol tartrate (LOPRESSOR) 50 MG tablet [[id number]]   Rx #: [id number]  cloNIDine (CATAPRES) 0.1 MG tablet [[id number]]   Pharmacy  [facility]  [address], [place] [address]  Phone:  [phone]  Fax:  [phone]

## 2021-05-11 ENCOUNTER — Telehealth (INDEPENDENT_AMBULATORY_CARE_PROVIDER_SITE_OTHER): Payer: Self-pay | Admitting: Nurse Practitioner

## 2021-05-11 ENCOUNTER — Encounter: Payer: Self-pay | Admitting: Nurse Practitioner

## 2021-05-11 ENCOUNTER — Other Ambulatory Visit: Payer: Self-pay

## 2021-05-11 DIAGNOSIS — F419 Anxiety disorder, unspecified: Secondary | ICD-10-CM

## 2021-05-11 MED ORDER — ESCITALOPRAM OXALATE 10 MG PO TABS
ORAL_TABLET | Freq: Every day | ORAL | 2 refills | Status: DC
  Filled 2021-05-11 – 2021-05-28 (×2): qty 60, 30d supply, fill #0

## 2021-05-11 NOTE — Progress Notes (Signed)
Virtual Visit via Telephone Note  I connected with Chen Saadeh on 05/11/21 at  1:20 PM EST by telephone and verified that I am speaking with the correct person using two identifiers.  Location: Patient: home Provider: office   I discussed the limitations, risks, security and privacy concerns of performing an evaluation and management service by telephone and the availability of in person appointments. I also discussed with the patient that there may be a patient responsible charge related to this service. The patient expressed understanding and agreed to proceed.   History of Present Illness:  Patient presents today through telephone visit for medication refills.  He states that he has had most of his medications refilled recently by Dr. Andrey Campanile.  He does need Lexapro refilled.  He states that his medication has been working well for him.  He states that a psychiatrist did place him on propranolol several months ago and that this worked well for him also.  He is considering wanting to go back on propranolol.  We discussed that we will get him in for follow-up with Dr. Andrey Campanile to discuss these medication changes. Denies f/c/s, n/v/d, hemoptysis, PND, chest pain or edema.     Observations/Objective:  Vitals with BMI 02/03/2021 01/05/2021 11/04/2020  Height 6\' 2"  - -  Weight 208 lbs - -  BMI 26.69 - -  Systolic 137 131  Diastolic 92 84 91  Pulse 66 60 60  Some encounter information is confidential and restricted. Go to Review Flowsheets activity to see all data.      Assessment and Plan:  Anxiety:  Will refill Lexapro  Will have patient follow up with Dr. 518 for medication management for anxiety and hypertension     I discussed the assessment and treatment plan with the patient. The patient was provided an opportunity to ask questions and all were answered. The patient agreed with the plan and demonstrated an understanding of the instructions.   The patient was advised to  call back or seek an in-person evaluation if the symptoms worsen or if the condition fails to improve as anticipated.  I provided 23 minutes of non-face-to-face time during this encounter.   Andrey Campanile, NP

## 2021-05-11 NOTE — Patient Instructions (Signed)
Anxiety:  Will refill Lexapro  Will have patient follow up with Dr. Andrey Campanile for medication management for anxiety and hypertension

## 2021-05-11 NOTE — Progress Notes (Signed)
Patient has eaten today and patient has taken medication today. Patient denies pain at this time. Patient needs refills requesting refills on propranolol, trazodone, gabapentin and lexapro. Patient shares meds have been helping.

## 2021-05-13 ENCOUNTER — Other Ambulatory Visit: Payer: Self-pay

## 2021-05-28 ENCOUNTER — Other Ambulatory Visit: Payer: Self-pay

## 2021-06-08 ENCOUNTER — Other Ambulatory Visit (HOSPITAL_COMMUNITY)
Admission: EM | Admit: 2021-06-08 | Discharge: 2021-06-11 | Disposition: A | Discharge status: Home / Self Care | Payer: No Payment, Other | Attending: Psychiatry | Admitting: Psychiatry

## 2021-06-08 DIAGNOSIS — Z5900 Homelessness unspecified: Secondary | ICD-10-CM | POA: Insufficient documentation

## 2021-06-08 DIAGNOSIS — I10 Essential (primary) hypertension: Secondary | ICD-10-CM | POA: Insufficient documentation

## 2021-06-08 DIAGNOSIS — R0602 Shortness of breath: Secondary | ICD-10-CM | POA: Insufficient documentation

## 2021-06-08 DIAGNOSIS — F411 Generalized anxiety disorder: Secondary | ICD-10-CM | POA: Diagnosis not present

## 2021-06-08 DIAGNOSIS — F332 Major depressive disorder, recurrent severe without psychotic features: Secondary | ICD-10-CM | POA: Diagnosis not present

## 2021-06-08 DIAGNOSIS — R45851 Suicidal ideations: Secondary | ICD-10-CM | POA: Diagnosis not present

## 2021-06-08 DIAGNOSIS — G4700 Insomnia, unspecified: Secondary | ICD-10-CM | POA: Diagnosis not present

## 2021-06-08 DIAGNOSIS — Z79899 Other long term (current) drug therapy: Secondary | ICD-10-CM | POA: Insufficient documentation

## 2021-06-08 DIAGNOSIS — Z20822 Contact with and (suspected) exposure to covid-19: Secondary | ICD-10-CM | POA: Insufficient documentation

## 2021-06-08 LAB — COMPREHENSIVE METABOLIC PANEL
ALT: 77 U/L — ABNORMAL HIGH (ref 0–44)
AST: 51 U/L — ABNORMAL HIGH (ref 15–41)
Albumin: 4.7 g/dL (ref 3.5–5.0)
Alkaline Phosphatase: 52 U/L (ref 38–126)
Anion gap: 11 (ref 5–15)
BUN: 5 mg/dL — ABNORMAL LOW (ref 6–20)
CO2: 28 mmol/L (ref 22–32)
Calcium: 10.2 mg/dL (ref 8.9–10.3)
Chloride: 99 mmol/L (ref 98–111)
Creatinine, Ser: 1.17 mg/dL (ref 0.61–1.24)
GFR, Estimated: >60 mL/min (ref >60)
Glucose, Bld: 118 mg/dL — ABNORMAL HIGH (ref 70–99)
Potassium: 4.6 mmol/L (ref 3.5–5.1)
Sodium: 138 mmol/L (ref 135–145)
Total Bilirubin: 0.8 mg/dL (ref 0.3–1.2)
Total Protein: 8.2 g/dL — ABNORMAL HIGH (ref 6.5–8.1)

## 2021-06-08 LAB — HEMOGLOBIN A1C
Hgb A1c MFr Bld: 5.7 % — ABNORMAL HIGH (ref 4.8–5.6)
Mean Plasma Glucose: 116.89 mg/dL

## 2021-06-08 LAB — URINALYSIS, ROUTINE W REFLEX MICROSCOPIC
Bilirubin Urine: NEGATIVE
Glucose, UA: NEGATIVE mg/dL
Hgb urine dipstick: NEGATIVE
Ketones, ur: NEGATIVE mg/dL
Leukocytes,Ua: NEGATIVE
Nitrite: NEGATIVE
Protein, ur: NEGATIVE mg/dL
Specific Gravity, Urine: 1.01 (ref 1.005–1.030)
pH: 6 (ref 5.0–8.0)

## 2021-06-08 LAB — POCT URINE DRUG SCREEN - MANUAL ENTRY (I-SCREEN)
POC Amphetamine UR: NOT DETECTED
POC Buprenorphine (BUP): NOT DETECTED
POC Cocaine UR: NOT DETECTED
POC Marijuana UR: POSITIVE — AB
POC Methadone UR: NOT DETECTED
POC Methamphetamine UR: NOT DETECTED
POC Morphine: NOT DETECTED
POC Oxazepam (BZO): NOT DETECTED
POC Oxycodone UR: NOT DETECTED
POC Secobarbital (BAR): NOT DETECTED

## 2021-06-08 LAB — LIPID PANEL
Cholesterol: 288 mg/dL — ABNORMAL HIGH (ref 0–200)
HDL: 40 mg/dL — ABNORMAL LOW (ref >40)
LDL Cholesterol: 196 mg/dL — ABNORMAL HIGH (ref 0–99)
Total CHOL/HDL Ratio: 7.2 RATIO
Triglycerides: 258 mg/dL — ABNORMAL HIGH (ref <150)
VLDL: 52 mg/dL — ABNORMAL HIGH (ref 0–40)

## 2021-06-08 LAB — RESP PANEL BY RT-PCR (FLU A&B, COVID) ARPGX2
Influenza A by PCR: NEGATIVE
Influenza B by PCR: NEGATIVE
SARS Coronavirus 2 by RT PCR: NEGATIVE

## 2021-06-08 LAB — CBC WITH DIFFERENTIAL/PLATELET
Abs Immature Granulocytes: 0.02 10*3/uL (ref 0.00–0.07)
Basophils Absolute: 0 10*3/uL (ref 0.0–0.1)
Basophils Relative: 1 %
Eosinophils Absolute: 0.1 10*3/uL (ref 0.0–0.5)
Eosinophils Relative: 2 %
HCT: 57.2 % — ABNORMAL HIGH (ref 39.0–52.0)
Hemoglobin: 19.6 g/dL — ABNORMAL HIGH (ref 13.0–17.0)
Immature Granulocytes: 0 %
Lymphocytes Relative: 29 %
Lymphs Abs: 2.1 10*3/uL (ref 0.7–4.0)
MCH: 30.6 pg (ref 26.0–34.0)
MCHC: 34.3 g/dL (ref 30.0–36.0)
MCV: 89.4 fL (ref 80.0–100.0)
Monocytes Absolute: 0.7 10*3/uL (ref 0.1–1.0)
Monocytes Relative: 9 %
Neutro Abs: 4.3 10*3/uL (ref 1.7–7.7)
Neutrophils Relative %: 59 %
Platelets: 239 10*3/uL (ref 150–400)
RBC: 6.4 MIL/uL — ABNORMAL HIGH (ref 4.22–5.81)
RDW: 15.1 % (ref 11.5–15.5)
WBC: 7.3 10*3/uL (ref 4.0–10.5)
nRBC: 0 % (ref 0.0–0.2)

## 2021-06-08 LAB — POC SARS CORONAVIRUS 2 AG -  ED: SARS Coronavirus 2 Ag: NEGATIVE

## 2021-06-08 LAB — ETHANOL: Alcohol, Ethyl (B): 44 mg/dL — ABNORMAL HIGH (ref <10)

## 2021-06-08 LAB — TSH: TSH: 1.306 u[IU]/mL (ref 0.350–4.500)

## 2021-06-08 LAB — POC SARS CORONAVIRUS 2 AG: SARSCOV2ONAVIRUS 2 AG: NEGATIVE

## 2021-06-08 MED ORDER — ONDANSETRON 4 MG PO TBDP: 4.0000 mg | ORAL_TABLET | Freq: Four times a day (QID) | ORAL | Status: DC | PRN

## 2021-06-08 MED ORDER — ADULT MULTIVITAMIN W/MINERALS CH
1.0000 | ORAL_TABLET | Freq: Every day | ORAL | Status: DC
  Administered 2021-06-08 – 2021-06-10 (×3): 1 via ORAL
  Filled 2021-06-08 (×3): qty 1
  Filled 2021-06-08: qty 7
  Filled 2021-06-08: qty 1

## 2021-06-08 MED ORDER — METOPROLOL TARTRATE 50 MG PO TABS
50.0000 mg | ORAL_TABLET | Freq: Two times a day (BID) | ORAL | Status: DC
  Administered 2021-06-08 – 2021-06-11 (×7): 50 mg via ORAL
  Filled 2021-06-08 (×2): qty 1
  Filled 2021-06-08: qty 14
  Filled 2021-06-08 (×5): qty 1

## 2021-06-08 MED ORDER — CLONIDINE HCL 0.1 MG PO TABS
0.1000 mg | ORAL_TABLET | Freq: Three times a day (TID) | ORAL | Status: DC
  Administered 2021-06-08 – 2021-06-11 (×9): 0.1 mg via ORAL
  Filled 2021-06-08 (×9): qty 1
  Filled 2021-06-08: qty 21

## 2021-06-08 MED ORDER — TRAZODONE HCL 100 MG PO TABS
100.0000 mg | ORAL_TABLET | Freq: Every day | ORAL | Status: DC
  Administered 2021-06-08 – 2021-06-10 (×3): 100 mg via ORAL
  Filled 2021-06-08: qty 1
  Filled 2021-06-08: qty 7
  Filled 2021-06-08 (×2): qty 1

## 2021-06-08 MED ORDER — LORAZEPAM 1 MG PO TABS: 1.0000 mg | ORAL_TABLET | Freq: Four times a day (QID) | ORAL | Status: DC | PRN

## 2021-06-08 MED ORDER — ALUM & MAG HYDROXIDE-SIMETH 200-200-20 MG/5ML PO SUSP: 30.0000 mL | ORAL | Status: DC | PRN

## 2021-06-08 MED ORDER — MAGNESIUM HYDROXIDE 400 MG/5ML PO SUSP: 30.0000 mL | Freq: Every day | ORAL | Status: DC | PRN

## 2021-06-08 MED ORDER — LOPERAMIDE HCL 2 MG PO CAPS
2.0000 mg | ORAL_CAPSULE | ORAL | Status: DC | PRN
  Administered 2021-06-09: 11:00:00 4 mg via ORAL
  Administered 2021-06-09 – 2021-06-10 (×3): 2 mg via ORAL
  Filled 2021-06-08 (×2): qty 1
  Filled 2021-06-08: qty 2
  Filled 2021-06-08: qty 1

## 2021-06-08 MED ORDER — HYDROXYZINE HCL 25 MG PO TABS
25.0000 mg | ORAL_TABLET | Freq: Four times a day (QID) | ORAL | Status: DC | PRN
  Administered 2021-06-09 – 2021-06-10 (×3): 25 mg via ORAL
  Filled 2021-06-08: qty 1

## 2021-06-08 MED ORDER — GABAPENTIN 400 MG PO CAPS
400.0000 mg | ORAL_CAPSULE | Freq: Three times a day (TID) | ORAL | Status: DC
  Administered 2021-06-08 – 2021-06-11 (×9): 400 mg via ORAL
  Filled 2021-06-08 (×5): qty 1
  Filled 2021-06-08: qty 21
  Filled 2021-06-08 (×4): qty 1

## 2021-06-08 MED ORDER — THIAMINE HCL 100 MG PO TABS
100.0000 mg | ORAL_TABLET | Freq: Every day | ORAL | Status: DC
  Administered 2021-06-09 – 2021-06-11 (×3): 100 mg via ORAL
  Filled 2021-06-08: qty 7
  Filled 2021-06-08 (×3): qty 1

## 2021-06-08 MED ORDER — HYDROXYZINE HCL 25 MG PO TABS
25.0000 mg | ORAL_TABLET | Freq: Three times a day (TID) | ORAL | Status: DC | PRN
  Administered 2021-06-08 – 2021-06-11 (×3): 25 mg via ORAL
  Filled 2021-06-08: qty 10
  Filled 2021-06-08 (×6): qty 1

## 2021-06-08 MED ORDER — ACETAMINOPHEN 325 MG PO TABS: 650.0000 mg | ORAL_TABLET | Freq: Four times a day (QID) | ORAL | Status: DC | PRN

## 2021-06-08 MED ORDER — ESCITALOPRAM OXALATE 20 MG PO TABS
20.0000 mg | ORAL_TABLET | Freq: Every day | ORAL | Status: DC
  Administered 2021-06-08 – 2021-06-11 (×4): 20 mg via ORAL
  Filled 2021-06-08 (×3): qty 2
  Filled 2021-06-08: qty 7
  Filled 2021-06-08: qty 2

## 2021-06-08 MED ORDER — THIAMINE HCL 100 MG/ML IJ SOLN
100.0000 mg | Freq: Once | INTRAMUSCULAR | Status: AC
  Administered 2021-06-08: 16:00:00 100 mg via INTRAMUSCULAR
  Filled 2021-06-08: qty 2

## 2021-06-08 NOTE — ED Provider Notes (Addendum)
Behavioral Health Admission [facility]&P [facility])  Date: 06/08/21 Patient Name: Eric Keller MRN: [id number] Chief Complaint:  Chief Complaint  Patient presents with   Depression    35 year old male Caster present to [facility] with complaints of depressive symptome with suicidal ideations present past two. Denied substance use. Depressive symptoms: total homelessness, guilt, worthlessness, lack of sleep (report haven't sleep in almost two hours in 2-weeks), extreme mood swings, "just non-functional." Denied plan just thoughts of "I would rather be died, I will be better off dead." Report extreme anxiety and pure panic. Denied homicidal ideations, denied auditory/visual hallucinations.        Diagnoses:  Final diagnoses:  Severe episode of recurrent major depressive disorder, without psychotic features (HCC)    HPI:  35 year old male history of depression, anxiety, substance use (benzodiazepine and alcohol) who presents to [facility] for assessment of worsening mood and suicidal thoughts.  Patient interviewed in conjunction with TTS counselor.  Patient states "I have been here before, usually substance use or depression" but goes on to describe that this situation is different in regards to substance use.  Patient states that he has not used benzodiazepines in approximately 5 to 6 months and states that he has decreased his alcohol intake substantially.  Patient states that he has been sober from alcohol for approximately 3 months prior to 2 weeks ago when he began to drink again.  Patient states that his last drink prior to presentation was a glass of wine yesterday.  Patient denies drinking daily over the last 2 weeks but states that he has been steadily increasing his drinking; however, is unablt to quantify at this time.  Patient states that he did participate in [facility] in the past, which is consistent with chart review; however, he states that he did not complete it.  Patient states that he "did it for a while"  and indicates that it was helpful despite not completing the program.    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  Patient states about 2 to 3 weeks ago his anxiety increased and his depression worsened to the point where he began to experience suicidal thoughts and states that it "snowballed" since that time leading to his presentation to the [facility].  Patient describes his current mood as a 1-2 out of 10 (10 being the best).  He reports associated depressive symptoms of sleep disturbance, guilt, hopelessness, decreased energy, decreased concentration.  Patient describes experiencing severe anxiety and describes his anxiety is worse upon waking up and describes racing thoughts and that he experiences "stress that does not make sense".   He denies HI/AVH.  He reports previous hospitalization at [facility] that were "very similar" for his reason for presentation to the [facility] today. He cites his family as his support system and states that he currently lives with his father.  He denies having a psychiatrist currently and states his PCP Dr. Andrey Campanile manages his psychiatric medications.  No symptoms reported or elicited that would be c/w a manic episode. Patient states that he is unable to contract for safety at this time  if he were to be discharged; he is agreeable to [facility] admission for further treatment, safety, and stabilization.   At time of assessment patient describes feeling short of breath.  Patient states that his current feeling of shortness of breath is consistent with how he normally experiences anxiety. Patient states that his medical history  is significant for hypertension and  states that he did not take his blood pressure medications of clonidine or metroprolol this morning   Past Psychiatric History: Previous Medication Trials: trazodone, lexapro, gabapentin Previous Psychiatric Hospitalizations: yes, x2 at [facility] Previous Suicide Attempts: denies History of Violence: denies Outpatient psychiatrist: none; psychitric medications currently prescribed by PCP Dr. Andrey Campanile  Social History: Marital Status: not married Children: 0 Source of Income: Journalist, newspaper Education:  associate's degree Special Ed: no Housing Status: with father History of phys/sexual abuse: denies Easy access to gun: denies  Substance Use (with emphasis over the last 12 months) Recreational Drugs: marijuana Use of Alcohol:  occasional; states that he started drinking the last 3 weeks agter ~3 months of sobriety Tobacco Use: yes, Rehab History: yes   Legal History: Past Charges/Incarcerations: denies Pending charges: denies  Family Psychiatric History: Brother and sister with anxiety and depression Uncle with alcohol use No history of attempted or completed suicides    PHQ 2-9:  Flowsheet Row Video Visit from 05/11/2021 in Primary Care at [facility] Visit from 02/03/2021 in Primary Care at [facility] Telemedicine from 11/20/2020 in Primary Care at [facility]  Thoughts that you would be better off dead, or of hurting yourself in some way Not at all Not at all Not at all  PHQ-9 Total Score 5 11 5        Flowsheet Row ED from 01/05/2021 in [facility] at [facility] Most recent reading at 01/05/2021  4:32 PM ED to Hosp-Admission (Discharged) from 10/30/2020 in [facility] Most recent reading at 10/30/2020  2:00 PM ED from 10/30/2020 in [facility] Most recent reading at 10/30/2020 12:32 PM  C-SSRS RISK CATEGORY No Risk High Risk High Risk        Total Time spent with  patient: 30 minutes  Musculoskeletal  Strength & Muscle Tone: within normal limits Gait & Station: normal Patient leans: N/A  Psychiatric Specialty Exam  Presentation General Appearance: Appropriate for Environment; Casual  Eye Contact:Good  Speech:Clear and Coherent; Normal Rate  Speech Volume:Normal  Handedness:Right   Mood and Affect  Mood:Dysphoric  Affect:Depressed; Congruent   Thought Process  Thought Processes:Coherent; Goal Directed; Linear  Descriptions of Associations:Intact  Orientation:Full (Time, Place and Person)  Thought Content:Logical; WDL  Diagnosis of Schizophrenia or Schizoaffective disorder in past: No   Hallucinations:Hallucinations: None  Ideas of Reference:None  Suicidal Thoughts:Suicidal Thoughts: Yes, Passive  Homicidal Thoughts:No data recorded  Sensorium  Memory:Immediate Good; Remote Good  Judgment:Fair  Insight:Fair   Executive Functions  Concentration:Good  Attention Span:Good  Recall:Good  Fund of Knowledge:Good  Language:Good   Psychomotor Activity  Psychomotor Activity:Psychomotor Activity: Normal   Assets  Assets:Communication Skills; Desire for Improvement; Housing; Social Support; Resilience; Physical Health; Vocational/Educational   Sleep  Sleep:Sleep: Poor   No data recorded  Physical Exam Vitals and nursing note reviewed.  Constitutional:      Appearance: Normal appearance. He is normal weight.  HENT:     Head: Normocephalic and atraumatic.  Eyes:     Extraocular Movements: Extraocular movements intact.     Conjunctiva/sclera: Conjunctivae normal.  Cardiovascular:  Rate and Rhythm: Normal rate and regular rhythm.     Heart sounds: Normal heart sounds.  Pulmonary:     Effort: Pulmonary effort is normal.     Breath sounds: Normal breath sounds.  Abdominal:     General: Bowel sounds are normal.     Palpations: Abdomen is soft.  Neurological:     General: No focal deficit present.      Mental Status: He is alert and oriented to person, place, and time.  Psychiatric:        Attention and Perception: Attention and perception normal.        Speech: Speech normal.        Behavior: Behavior normal. Behavior is cooperative.        Thought Content: Thought content normal.   Review of Systems  Constitutional:  Negative for chills and fever.  HENT:  Negative for hearing loss.   Eyes:  Negative for discharge and redness.  Respiratory:  Positive for shortness of breath. Negative for cough.   Cardiovascular:  Negative for chest pain.  Gastrointestinal:  Negative for abdominal pain.  Musculoskeletal:  Negative for myalgias.  Neurological:  Negative for headaches.  Psychiatric/Behavioral:  Positive for depression and suicidal ideas. Negative for hallucinations. The patient is nervous/anxious.    Blood pressure (!) 152/108, pulse 71, temperature 98.5 F (36.9 C), temperature source Oral, SpO2 98 %. There is no height or weight on file to calculate BMI.  Past Psychiatric History: depression, anxiety, substance use (alcohol and benzodiazepines)   Is the patient at risk to self? Yes  Has the patient been a risk to self in the past 6 months? No .    Has the patient been a risk to self within the distant past? Yes   Is the patient a risk to others? No   Has the patient been a risk to others in the past 6 months? No   Has the patient been a risk to others within the distant past? No   Past Medical History:  Past Medical History:  Diagnosis Date   Alcohol withdrawal (HCC) 07/20/2019   Anxiety    Anxiety    Depression    H/O: substance abuse (HCC)    Hypertension     Past Surgical History:  Procedure Laterality Date   WISDOM TOOTH EXTRACTION      Family History:  Family History  Problem Relation Age of Onset   Cancer Mother    Hypertension Paternal Grandfather    Heart disease Paternal Grandfather        MIs   Heart disease Other    Stroke Other     Social  History:  Social History   Socioeconomic History   Marital status: Single    Spouse name: Not on file   Number of children: Not on file   Years of education: Not on file   Highest education level: Not on file  Occupational History   Not on file  Tobacco Use   Smoking status: Never    Passive exposure: Past   Smokeless tobacco: Never  Vaping Use   Vaping Use: Never used  Substance and Sexual Activity   Alcohol use: Yes    Comment: daily 5th of liquor   Drug use: Not Currently    Types: Marijuana    Comment: Vicodin Abuse, BEnzos   Sexual activity: Not Currently  Other Topics Concern   Not on file  Social History Narrative   Not on file   Social  Determinants of Health   Financial Resource Strain: Not on file  Food Insecurity: Not on file  Transportation Needs: Not on file  Physical Activity: Not on file  Stress: Not on file  Social Connections: Not on file  Intimate Partner Violence: Not on file    SDOH:  SDOH Screenings   Alcohol Screen: Not on file  Depression (PHQ2-9): Medium Risk   PHQ-2 Score: 5  Financial Resource Strain: Not on file  Food Insecurity: Not on file  Housing: Not on file  Physical Activity: Not on file  Social Connections: Not on file  Stress: Not on file  Tobacco Use: Low Risk    Smoking Tobacco Use: Never   Smokeless Tobacco Use: Never   Passive Exposure: Past  Transportation Needs: Not on file    Last Labs:  Admission on 06/08/2021  Component Date Value Ref Range Status   SARS Coronavirus 2 Ag 06/08/2021 Negative  Negative Final   SARSCOV2ONAVIRUS 2 AG 06/08/2021 NEGATIVE  NEGATIVE Final   Comment: (NOTE) SARS-CoV-2 antigen NOT DETECTED.   Negative results are presumptive.  Negative results do not preclude SARS-CoV-2 infection and should not be used as the sole basis for treatment or other patient management decisions, including infection  control decisions, particularly in the presence of clinical signs and  symptoms  consistent with COVID-19, or in those who have been in contact with the virus.  Negative results must be combined with clinical observations, patient history, and epidemiological information. The expected result is Negative.  Fact Sheet for Patients: https://www.jennings-kim.com/  Fact Sheet for Healthcare Providers: https://alexander-rogers.biz/  This test is not yet approved or cleared by the [place] FDA and  has been authorized for detection and/or diagnosis of SARS-CoV-2 by FDA under an Emergency Use Authorization (EUA).  This EUA will remain in effect (meaning this test can be used) for the duration of  the COV                          ID-19 declaration under Section 564(b)(1) of the Act, 21 U.S.C. section 360bbb-3(b)(1), unless the authorization is terminated or revoked sooner.      Allergies: Patient has no known allergies.  PTA Medications: (Not in a hospital admission)   Medical Decision Making  35 year old male history of depression, anxiety, substance use (benzodiazepine and alcohol) who presents to [facility] for assessment of worsening mood and suicidal thoughts the past 2 weeks; patient is unable to contract for safety and is agreeable to admission to the [facility] at this time for crisis stabilization. Patient reports a history of substance use although states that he has not used benzodiazepines in 5-6 months. He reports relapsing on etoh over the last 2 weeks after ~3 months of sobriety due to increased depression and anxiety  with no identifiable trigger/stressor apart from starting a new job ~6-7 weeksa go. He reports compliance with home lexapro 20 mg, trazodone 100 mg qhs, and gabapentin 400 mg TID. He is agreeable to restart these and other home medications at this time.   Routine Labs ordered- CBC, CMP, TSH, a1c, lipid panel, ethanol, UDS, pregnancy test, EKG.  The patient demonstrates the following risk factors for suicide: Chronic risk  factors for suicide include: psychiatric disorder of depression/anxiety . Acute risk factors for suicide include:  recent job stress and is concerned about job loss, loss of sobriety . Protective factors for this patient include: positive social support and responsibility to others (children, family). Considering  these factors, the overall suicide risk at this point appears to be low.   MDD -contine lexapro 20 mg  AUD, severe -continue 400 mg TID gabapentin for alcohol cravings -Patient reports consuming alcohol heavily this past 2 weeks after 3 months of sobriety d/t worsening anxiety and depression-will place on CIWA protocol out of caution although suspect there will be minimal/mild alcohol withdrawal sx based on reported frequency/duration of heavy alcohol usage  HTN -restart home metoprolol 50 mg daily -restart home clonidine 0.1 mg TID  Dispo: ongoing. May be a candidate for IOP/PHP program after stabilization. Will consult with SW for assistance      Recommendations  Based on my evaluation the patient does not appear to have an emergency medical condition.  Estella Husk, MD 06/08/21  1:47 PM

## 2021-06-08 NOTE — Progress Notes (Signed)
°   06/08/21 1214  [facility] Triage Screening (Walk-ins at [facility] only)  What Is the Reason for Your Visit/Call Today? 35 year old male Emilian present to [facility] with complaints of depressive symptome with suicidal ideations present past two. Denied substance use. Depressive symptoms: total homelessness, guilt, worthlessness, lack of sleep (report haven't sleep in almost two hours in 2-weeks), extreme mood swings, "just non-functional." Denied plan just thoughts of "I would rather be died, I will be better off dead." Report extreme anxiety and pure panic. Denied homicidal ideations, denied auditory/visual hallucinations.  Report in the past when he normally in this state of mind he dependent on drugs/alchol, report that is not the case this time. Denied fire arms in the home. Report lives with dad. Report started a new job 6-weeks ago it's going good. Denied currently seeing a therapist or psychiatrist. Report currently gets medication from the PCP Dr. Andrey Campanile with [facility]. Patient currently could not contract for safely.  How Long Has This Been Causing You Problems? 1 wk - 1 month (report depressive symptoms with suicidal ideations present past 2-weeks.)  Have You Recently Had Any Thoughts About Hurting Yourself? No  Are You Planning to Commit Suicide/Harm Yourself At This time? No (report suicidal ideations currenlty denied plan)  Have you Recently Had Thoughts About Hurting Someone Karolee Ohs? No  Are You Planning To Harm Someone At This Time? No  Are you currently experiencing any auditory, visual or other hallucinations? No  Have You Used Any Alcohol or Drugs in the Past 24 Hours? Yes  How long ago did you use Drugs or Alcohol? 06/07/2021  What Did You Use and How Much? report glass of wine and smoke THC  Do you have any current medical co-morbidities that require immediate attention? Yes  Please describe current medical co-morbidities that require immediate attention: anxiety  Clinician  description of patient physical appearance/behavior: extreme anxiety, sweats  What Do You Feel Would Help You the Most Today? Stress Management  If access to [facility] was not available, would you have sought care in the Emergency Department? Yes  Determination of Need Emergent (2 hours)  Options For Referral Medication Management

## 2021-06-08 NOTE — ED Notes (Signed)
Pt is currently sleeping, no distress noted, environmental check complete, will continue to monitor patient for safety. ? ?

## 2021-06-08 NOTE — B[facility] Counselor (Deleted)
TTS discussed safety plan with wife. She expressed she would contact patient's brother (Tim - [phone]). TTS witnessed patient's wife call the patient's brother discussing the safety plan. The brother agreed he would go to the home, change the combination of the safe, place all firearms in the safe as well as hunting knives. The brother will be the only one who will have the combination.   °  °  °  ° °

## 2021-06-08 NOTE — ED Notes (Signed)
The patient came to the nurses station asking for his trazodone. This nurse advised the patient that the medication is not due until 10pm.

## 2021-06-08 NOTE — ED Notes (Signed)
Pt has been given his medication and he is in bed resting, no distress noted. Will continue to monitor patient for safety

## 2021-06-08 NOTE — ED Notes (Signed)
Pt awake and alert .  Flat affect but good eye contact.  Pt states that he has been struggling with anxiety and depression "for a while now"  Reports that he recently started a new job and  anxiety has increased.  Reports thoughts of self harm  but does not have a plan.   Pt offered meal.  Declined.  Awaiting Covid Results to transport to [facility]. No currently distress noted.  Will monitor for safety.

## 2021-06-08 NOTE — ED Notes (Signed)
35 yo male admitted to [facility] endorsing passive SI and ETOH abuse). Denies HI/AVH. Pt reports, "It's the holiday and I always get depressed around this time of the year. It seems like for the past 2 weeks, my sx have gotten worse and the suicidal thoughts have gotten stronger. I don't think I need rehab but I do need to stop drinking. I was abusing Xanax but I haven't used in about 5 months". Pt Calm, cooperative throughout interview process. Skin assessment completed. Oriented to unit. Meal and drink offered. At currrent, pt continue to endorse passive SI no plan. Pt verbally contract for safety. Will monitor for safety.

## 2021-06-08 NOTE — ED Notes (Signed)
Patient declined to attend group due to him wanting to rest after encouragement from staff.
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
## 2021-06-09 DIAGNOSIS — F332 Major depressive disorder, recurrent severe without psychotic features: Secondary | ICD-10-CM | POA: Diagnosis not present

## 2021-06-09 DIAGNOSIS — G4700 Insomnia, unspecified: Secondary | ICD-10-CM | POA: Diagnosis not present

## 2021-06-09 DIAGNOSIS — F411 Generalized anxiety disorder: Secondary | ICD-10-CM | POA: Diagnosis not present

## 2021-06-09 DIAGNOSIS — R45851 Suicidal ideations: Secondary | ICD-10-CM | POA: Diagnosis not present

## 2021-06-09 MED ORDER — NICOTINE 21 MG/24HR TD PT24
21.0000 mg | MEDICATED_PATCH | Freq: Every day | TRANSDERMAL | Status: DC
  Administered 2021-06-09 – 2021-06-11 (×3): 21 mg via TRANSDERMAL
  Filled 2021-06-09 (×3): qty 1

## 2021-06-09 MED ORDER — QUETIAPINE FUMARATE 25 MG PO TABS
25.0000 mg | ORAL_TABLET | Freq: Every day | ORAL | Status: DC
  Administered 2021-06-09: 21:00:00 25 mg via ORAL
  Filled 2021-06-09: qty 1

## 2021-06-09 NOTE — ED Notes (Signed)
Pt in dayroom watching tv. Denies concerns or needs at present. Will continue to monitor for safety.

## 2021-06-09 NOTE — ED Notes (Signed)
Pt is currently in the dining room watching television, no distress noted will continue to monitor patient for safety.

## 2021-06-09 NOTE — ED Notes (Signed)
Pt sleeping in no acute distress. RR even and unlabored. Safety maintained. 

## 2021-06-09 NOTE — ED Notes (Signed)
Pt is currently sleeping, no distress noted, environmental check complete, will continue to monitor patient for safety. ? ?

## 2021-06-09 NOTE — ED Notes (Signed)
Patient A&Ox4. Denies intent to harm self/others when asked. Denies A/VH. C/O diarrhea episodes x 2 this morning. Immodium given with relief noted. Pt reports having difficulty sleeping last night due to being in an unfamiliar surrounding. Voiced concerns over starting SAIOP due to recently starting a new job. Support and encouragement provided. SW aware per pt of his concerns. Routine safety checks conducted according to facility protocol. Encouraged patient to notify staff if thoughts of harm toward self or others arise. Patient verbalize understanding and agreement. Will continue to monitor for safety.

## 2021-06-09 NOTE — ED Provider Notes (Addendum)
Behavioral Health Progress Note  Date and Time: 06/09/2021 1:29 PM Name: Eric Keller MRN:  [id number]  Subjective:   35 year old male history of depression, anxiety, substance use (benzodiazepine and alcohol) who presents to [facility] for assessment of worsening mood and suicidal thoughts on 12/19 and was admitted to the [facility] for further treatment. UDS+marijuana, etoh 47. Most recent CIQA 1  Patient seen and chart reviewed.  Most recent CIWA 1 patient interviewed in conjunction with social work this morning.  He is found in his room in no acute distress.  He describes his mood as "pretty crappy".  He reports sleeping poorly last night and attributes it partially to being in a new place.  Patient reports ongoing anxiety and depression.  He denies SI/HI/AVH.  Patient states that his goal being here is to be able to function better.  Patient discusses at length his concerns about losing his job that he just got due to his inability to perform to certain standard d/t his mood and associated symptoms.  Patient states that he would like to restart outpatient treatment with a provider and a therapist.  Patient states he has seen a psychiatrist and therapist in the past and described it as being helpful.  Discussed medications and he is agreeable to starting Seroquel 25 mg daily nightly as an adjunct for treatment of his mood.  Patient states that he had been on Seroquel previously although reported that it was discontinued; no clear reason for discontinuation provided per patient.  Patient is agreeable for continued treatment at the [facility].   Diagnosis:  Final diagnoses:  Severe episode of recurrent major depressive disorder, without psychotic features (HCC)    Total Time spent with patient: 20 minutes  Past Psychiatric History:  depression, anxiety, substance use (benzodiazepine and alcohol) Past Medical History:  Past Medical History:  Diagnosis Date   Alcohol withdrawal (HCC) 07/20/2019   Anxiety     Anxiety    Depression    H/O: substance abuse (HCC)    Hypertension     Past Surgical History:  Procedure Laterality Date   WISDOM TOOTH EXTRACTION     Family History:  Family History  Problem Relation Age of Onset   Cancer Mother    Hypertension Paternal Grandfather    Heart disease Paternal Grandfather        MIs   Heart disease Other    Stroke Other    Family Psychiatric  History:  Brother and sister with anxiety and depression Uncle with alcohol use No history of attempted or completed suicides; however, per chart review patient had previously reported his uncle died by suicide   Social History:  Social History   Substance and Sexual Activity  Alcohol Use Yes   Comment: daily 5th of liquor     Social History   Substance and Sexual Activity  Drug Use Not Currently   Types: Marijuana   Comment: Vicodin Abuse, BEnzos    Social History   Socioeconomic History   Marital status: Single    Spouse name: Not on file   Number of children: Not on file   Years of education: Not on file   Highest education level: Not on file  Occupational History   Not on file  Tobacco Use   Smoking status: Never    Passive exposure: Past   Smokeless tobacco: Never  Vaping Use   Vaping Use: Never used  Substance and Sexual Activity   Alcohol use: Yes    Comment: daily 5th of  liquor   Drug use: Not Currently    Types: Marijuana    Comment: Vicodin Abuse, BEnzos   Sexual activity: Not Currently  Other Topics Concern   Not on file  Social History Narrative   Not on file   Social Determinants of Health   Financial Resource Strain: Not on file  Food Insecurity: Not on file  Transportation Needs: Not on file  Physical Activity: Not on file  Stress: Not on file  Social Connections: Not on file   SDOH:  SDOH Screenings   Alcohol Screen: Not on file  Depression (PHQ2-9): Medium Risk   PHQ-2 Score: 5  Financial Resource Strain: Not on file  Food Insecurity: Not on file   Housing: Not on file  Physical Activity: Not on file  Social Connections: Not on file  Stress: Not on file  Tobacco Use: Low Risk    Smoking Tobacco Use: Never   Smokeless Tobacco Use: Never   Passive Exposure: Past  Transportation Needs: Not on file   Additional Social History:    Pain Medications: pt denies Prescriptions: Trazadone 100mg ; Prozac 40mg ; Matoperal (50mg  bid); Clonodine (.1mg  tid); Gabapentine 300mg  tid Over the Counter: Benadryl when needed History of alcohol / drug use?: Yes Longest period of sobriety (when/how long): 4 years Negative Consequences of Use: Personal relationships, Work / Programmer, multimedia Withdrawal Symptoms: Agitation, Irritability, Nausea / Vomiting, Tremors, Sweats, Seizures, Patient aware of relationship between substance abuse and physical/medical complications, DTs Onset of Seizures: NA Date of most recent seizure: last year Name of Substance 1: ETOH Glass of wine yesterday Name of Substance 2: Benzodiazepines                Sleep: Poor  Appetite:  Fair  Current Medications:  Current Facility-Administered Medications  Medication Dose Route Frequency Provider Last Rate Last Admin   acetaminophen (TYLENOL) tablet 650 mg  650 mg Oral Q6H PRN Estella Husk, MD       alum & mag hydroxide-simeth (MAALOX/MYLANTA) 200-200-20 MG/5ML suspension 30 mL  30 mL Oral Q4H PRN Estella Husk, MD       cloNIDine (CATAPRES) tablet 0.1 mg  0.1 mg Oral TID Estella Husk, MD   0.1 mg at 06/09/21 0933   escitalopram (LEXAPRO) tablet 20 mg  20 mg Oral Daily Estella Husk, MD   20 mg at 06/09/21 0933   gabapentin (NEURONTIN) capsule 400 mg  400 mg Oral TID Estella Husk, MD   400 mg at 06/09/21 1610   hydrOXYzine (ATARAX) tablet 25 mg  25 mg Oral TID PRN Estella Husk, MD   25 mg at 06/08/21 1358   hydrOXYzine (ATARAX) tablet 25 mg  25 mg Oral Q6H PRN Estella Husk, MD   25 mg at 06/09/21 0934   loperamide (IMODIUM)  capsule 2-4 mg  2-4 mg Oral PRN Estella Husk, MD   4 mg at 06/09/21 1036   LORazepam (ATIVAN) tablet 1 mg  1 mg Oral Q6H PRN Estella Husk, MD       magnesium hydroxide (MILK OF MAGNESIA) suspension 30 mL  30 mL Oral Daily PRN Estella Husk, MD       metoprolol tartrate (LOPRESSOR) tablet 50 mg  50 mg Oral BID Estella Husk, MD   50 mg at 06/09/21 9604   multivitamin with minerals tablet 1 tablet  1 tablet Oral Daily Estella Husk, MD   1 tablet at 06/09/21 0933   nicotine (NICODERM CQ -  dosed in mg/24 hours) patch 21 mg  21 mg Transdermal Daily Estella Husk, MD   21 mg at 06/09/21 1317   ondansetron (ZOFRAN-ODT) disintegrating tablet 4 mg  4 mg Oral Q6H PRN Estella Husk, MD       QUEtiapine (SEROQUEL) tablet 25 mg  25 mg Oral QHS Estella Husk, MD       thiamine tablet 100 mg  100 mg Oral Daily Estella Husk, MD   100 mg at 06/09/21 2993   traZODone (DESYREL) tablet 100 mg  100 mg Oral QHS Estella Husk, MD   100 mg at 06/08/21 2137   Current Outpatient Medications  Medication Sig Dispense Refill   cloNIDine (CATAPRES) 0.1 MG tablet TAKE 1 TABLET (0.1 MG TOTAL) BY MOUTH 3 (THREE) TIMES DAILY. (Patient taking differently: Take 0.1 mg by mouth 3 (three) times daily.) 90 tablet 2   diphenhydrAMINE (BENADRYL) 25 MG tablet Take 25 mg by mouth at bedtime as needed for allergies or sleep.     escitalopram (LEXAPRO) 10 MG tablet TAKE 2 TABLETS (20 MG TOTAL) BY MOUTH DAILY. (Patient taking differently: Take 10 mg by mouth 2 (two) times daily.) 60 tablet 2   metoprolol tartrate (LOPRESSOR) 50 MG tablet TAKE 1 TABLET (50 MG TOTAL) BY MOUTH 2 (TWO) TIMES DAILY. (Patient taking differently: Take 50 mg by mouth 2 (two) times daily.) 60 tablet 2   traZODone (DESYREL) 100 MG tablet Take 1 tablet (100 mg total) by mouth at bedtime. (Patient taking differently: Take 100 mg by mouth at bedtime as needed for sleep.) 30 tablet 0   gabapentin  (NEURONTIN) 400 MG capsule TAKE 1 CAPSULE (400 MG TOTAL) BY MOUTH 3 (THREE) TIMES DAILY. (Patient taking differently: Take 400 mg by mouth 3 (three) times daily.) 270 capsule 1    Labs  Lab Results:  Admission on 06/08/2021  Component Date Value Ref Range Status   SARS Coronavirus 2 by RT PCR 06/08/2021 NEGATIVE  NEGATIVE Final   Comment: (NOTE) SARS-CoV-2 target nucleic acids are NOT DETECTED.  The SARS-CoV-2 RNA is generally detectable in upper respiratory specimens during the acute phase of infection. The lowest concentration of SARS-CoV-2 viral copies this assay can detect is 138 copies/mL. A negative result does not preclude SARS-Cov-2 infection and should not be used as the sole basis for treatment or other patient management decisions. A negative result may occur with  improper specimen collection/handling, submission of specimen other than nasopharyngeal swab, presence of viral mutation(s) within the areas targeted by this assay, and inadequate number of viral copies(<138 copies/mL). A negative result must be combined with clinical observations, patient history, and epidemiological information. The expected result is Negative.  Fact Sheet for Patients:  BloggerCourse.com  Fact Sheet for Healthcare Providers:  SeriousBroker.it  This test is no                          t yet approved or cleared by the [place] FDA and  has been authorized for detection and/or diagnosis of SARS-CoV-2 by FDA under an Emergency Use Authorization (EUA). This EUA will remain  in effect (meaning this test can be used) for the duration of the COVID-19 declaration under Section 564(b)(1) of the Act, 21 U.S.C.section 360bbb-3(b)(1), unless the authorization is terminated  or revoked sooner.       Influenza A by PCR 06/08/2021 NEGATIVE  NEGATIVE Final   Influenza B by PCR 06/08/2021 NEGATIVE  NEGATIVE Final  Comment: (NOTE) The Xpert  Xpress SARS-CoV-2/FLU/RSV plus assay is intended as an aid in the diagnosis of influenza from Nasopharyngeal swab specimens and should not be used as a sole basis for treatment. Nasal washings and aspirates are unacceptable for Xpert Xpress SARS-CoV-2/FLU/RSV testing.  Fact Sheet for Patients: BloggerCourse.com  Fact Sheet for Healthcare Providers: SeriousBroker.it  This test is not yet approved or cleared by the [place] FDA and has been authorized for detection and/or diagnosis of SARS-CoV-2 by FDA under an Emergency Use Authorization (EUA). This EUA will remain in effect (meaning this test can be used) for the duration of the COVID-19 declaration under Section 564(b)(1) of the Act, 21 U.S.C. section 360bbb-3(b)(1), unless the authorization is terminated or revoked.  Performed at [facility], [address] [place], [place] [address]    WBC 06/08/2021 7.3  4.0 - 10.5 K/uL Final   RBC 06/08/2021 6.40 (H)  4.22 - 5.81 MIL/uL Final   Hemoglobin 06/08/2021 19.6 (H)  13.0 - 17.0 g/dL Final   HCT 60/45/4098 57.2 (H)  39.0 - 52.0 % Final   MCV 06/08/2021 89.4  80.0 - 100.0 fL Final   MCH 06/08/2021 30.6  26.0 - 34.0 pg Final   MCHC 06/08/2021 34.3  30.0 - 36.0 g/dL Final   RDW 11/91/4782 15.1  11.5 - 15.5 % Final   Platelets 06/08/2021 239  150 - 400 K/uL Final   nRBC 06/08/2021 0.0  0.0 - 0.2 % Final   Neutrophils Relative % 06/08/2021 59  % Final   Neutro Abs 06/08/2021 4.3  1.7 - 7.7 K/uL Final   Lymphocytes Relative 06/08/2021 29  % Final   Lymphs Abs 06/08/2021 2.1  0.7 - 4.0 K/uL Final   Monocytes Relative 06/08/2021 9  % Final   Monocytes Absolute 06/08/2021 0.7  0.1 - 1.0 K/uL Final   Eosinophils Relative 06/08/2021 2  % Final   Eosinophils Absolute 06/08/2021 0.1  0.0 - 0.5 K/uL Final   Basophils Relative 06/08/2021 1  % Final   Basophils Absolute 06/08/2021 0.0  0.0 - 0.1 K/uL Final   Immature Granulocytes  06/08/2021 0  % Final   Abs Immature Granulocytes 06/08/2021 0.02  0.00 - 0.07 K/uL Final   Performed at [facility], [address] [place], [place] [address]   Sodium 06/08/2021 138  135 - 145 mmol/L Final   Potassium 06/08/2021 4.6  3.5 - 5.1 mmol/L Final   Chloride 06/08/2021 99  98 - 111 mmol/L Final   CO2 06/08/2021 28  22 - 32 mmol/L Final   Glucose, Bld 06/08/2021 118 (H)  70 - 99 mg/dL Final   Glucose reference range applies only to samples taken after fasting for at least 8 hours.   BUN 06/08/2021 5 (L)  6 - 20 mg/dL Final   Creatinine, Ser 06/08/2021 1.17  0.61 - 1.24 mg/dL Final   Calcium 30/86/5784 10.2  8.9 - 10.3 mg/dL Final   Total Protein 69/62/9528 8.2 (H)  6.5 - 8.1 g/dL Final   Albumin 41/32/4401 4.7  3.5 - 5.0 g/dL Final   AST 02/72/5366 51 (H)  15 - 41 U/L Final   ALT 06/08/2021 77 (H)  0 - 44 U/L Final   Alkaline Phosphatase 06/08/2021 52  38 - 126 U/L Final   Total Bilirubin 06/08/2021 0.8  0.3 - 1.2 mg/dL Final   GFR, Estimated 06/08/2021 >60  >60 mL/min Final   Comment: (NOTE) Calculated using the CKD-EPI Creatinine Equation (2021)  Anion gap 06/08/2021 11  5 - 15 Final   Performed at [facility], [address] [place], [place] [address]   Hgb A1c MFr Bld 06/08/2021 5.7 (H)  4.8 - 5.6 % Final   Comment: (NOTE) Pre diabetes:          5.7%-6.4%  Diabetes:              >6.4%  Glycemic control for   <7.0% adults with diabetes    Mean Plasma Glucose 06/08/2021 116.89  mg/dL Final   Performed at [facility], [address], [place], [place] [address]   Alcohol, Ethyl (B) 06/08/2021 44 (H)  <10 mg/dL Final   Comment: (NOTE) Lowest detectable limit for serum alcohol is 10 mg/dL.  For medical purposes only. Performed at [facility], [address], [place], [place] [address]    Cholesterol 06/08/2021 288 (H)  0 - 200 mg/dL Final   Triglycerides 75/64/3329 258 (H)  <150 mg/dL Final   HDL 51/88/4166 40 (L)  >40 mg/dL Final    Total CHOL/HDL Ratio 06/08/2021 7.2  RATIO Final   VLDL 06/08/2021 52 (H)  0 - 40 mg/dL Final   LDL Cholesterol 06/08/2021 196 (H)  0 - 99 mg/dL Final   Comment:        Total Cholesterol/HDL:CHD Risk Coronary Heart Disease Risk Table                     Men   Women  1/2 Average Risk   3.4   3.3  Average Risk       5.0   4.4  2 X Average Risk   9.6   7.1  3 X Average Risk  23.4   11.0        Use the calculated Patient Ratio above and the CHD Risk Table to determine the patient's CHD Risk.        ATP III CLASSIFICATION (LDL):  <100     mg/dL   Optimal  063-016  mg/dL   Near or Above                    Optimal  130-159  mg/dL   Borderline  010-932  mg/dL   High  >355     mg/dL   Very High Performed at [facility], [address] [place], [place] [address]    TSH 06/08/2021 1.306  0.350 - 4.500 uIU/mL Final   Comment: Performed by a 3rd Generation assay with a functional sensitivity of <=0.01 uIU/mL. Performed at [facility], [address], [place], [place] [address]    Color, Urine 06/08/2021 YELLOW  YELLOW Final   APPearance 06/08/2021 CLEAR  CLEAR Final   Specific Gravity, Urine 06/08/2021 1.010  1.005 - 1.030 Final   pH 06/08/2021 6.0  5.0 - 8.0 Final   Glucose, UA 06/08/2021 NEGATIVE  NEGATIVE mg/dL Final   Hgb urine dipstick 06/08/2021 NEGATIVE  NEGATIVE Final   Bilirubin Urine 06/08/2021 NEGATIVE  NEGATIVE Final   Ketones, ur 06/08/2021 NEGATIVE  NEGATIVE mg/dL Final   Protein, ur 12/12/7626 NEGATIVE  NEGATIVE mg/dL Final   Nitrite 31/51/7616 NEGATIVE  NEGATIVE Final   Leukocytes,Ua 06/08/2021 NEGATIVE  NEGATIVE Final   Comment: Microscopic not done on urines with negative protein, blood, leukocytes, nitrite, or glucose < 500 mg/dL. Performed at [facility], [address], [place], [place] [address]    POC Amphetamine  UR 06/08/2021 None Detected  NONE DETECTED (Cut Off Level 1000 ng/mL) Final   POC Secobarbital (BAR) 06/08/2021 None Detected   NONE DETECTED (Cut Off Level 300 ng/mL) Final   POC Buprenorphine (BUP) 06/08/2021 None Detected  NONE DETECTED (Cut Off Level 10 ng/mL) Final   POC Oxazepam (BZO) 06/08/2021 None Detected  NONE DETECTED (Cut Off Level 300 ng/mL) Final   POC Cocaine UR 06/08/2021 None Detected  NONE DETECTED (Cut Off Level 300 ng/mL) Final   POC Methamphetamine UR 06/08/2021 None Detected  NONE DETECTED (Cut Off Level 1000 ng/mL) Final   POC Morphine 06/08/2021 None Detected  NONE DETECTED (Cut Off Level 300 ng/mL) Final   POC Oxycodone UR 06/08/2021 None Detected  NONE DETECTED (Cut Off Level 100 ng/mL) Final   POC Methadone UR 06/08/2021 None Detected  NONE DETECTED (Cut Off Level 300 ng/mL) Final   POC Marijuana UR 06/08/2021 Positive (A)  NONE DETECTED (Cut Off Level 50 ng/mL) Final   SARS Coronavirus 2 Ag 06/08/2021 Negative  Negative Final   SARSCOV2ONAVIRUS 2 AG 06/08/2021 NEGATIVE  NEGATIVE Final   Comment: (NOTE) SARS-CoV-2 antigen NOT DETECTED.   Negative results are presumptive.  Negative results do not preclude SARS-CoV-2 infection and should not be used as the sole basis for treatment or other patient management decisions, including infection  control decisions, particularly in the presence of clinical signs and  symptoms consistent with COVID-19, or in those who have been in contact with the virus.  Negative results must be combined with clinical observations, patient history, and epidemiological information. The expected result is Negative.  Fact Sheet for Patients: https://www.jennings-kim.com/  Fact Sheet for Healthcare Providers: https://alexander-rogers.biz/  This test is not yet approved or cleared by the [place] FDA and  has been authorized for detection and/or diagnosis of SARS-CoV-2 by FDA under an Emergency Use Authorization (EUA).  This EUA will remain in effect (meaning this test can be used) for the duration of  the COV                           ID-19 declaration under Section 564(b)(1) of the Act, 21 U.S.C. section 360bbb-3(b)(1), unless the authorization is terminated or revoked sooner.      Blood Alcohol level:  Lab Results  Component Value Date   ETH 44 (H) 06/08/2021   ETH 18 (H) 10/30/2020    Metabolic Disorder Labs: Lab Results  Component Value Date   HGBA1C 5.7 (H) 06/08/2021   MPG 116.89 06/08/2021   No results found for: PROLACTIN Lab Results  Component Value Date   CHOL 288 (H) 06/08/2021   TRIG 258 (H) 06/08/2021   HDL 40 (L) 06/08/2021   CHOLHDL 7.2 06/08/2021   VLDL 52 (H) 06/08/2021   LDLCALC 196 (H) 06/08/2021    Therapeutic Lab Levels: No results found for: LITHIUM No results found for: VALPROATE No components found for:  CBMZ  Physical Findings   AIMS    Flowsheet Row [facility]  AIMS Total Score 0      AUDIT    Flowsheet Row [facility]  Alcohol Use Disorder Identification Test Final Score (AUDIT) 12      CAGE-AID    Flowsheet Row ED to Hosp-Admission (Discharged) from 10/30/2020 in [facility]  CAGE-AID Score 4      GAD-7  Flowsheet Row Video Visit from 05/11/2021 in Primary Care at [facility] from 11/20/2020 in Primary Care at [facility] from 09/03/2020 in Primary Care at [facility] from 07/03/2020 in Primary Care at [facility] from 05/27/2020 in Primary Care at [facility]  Total GAD-7 Score PHQ2-9    Flowsheet Row Video Visit from 05/11/2021 in Primary Care at [facility] Visit from 02/03/2021 in Primary Care at [facility]icine from 11/20/2020 in Primary Care at [facility] Video Visit from 09/08/2020 in [facility] Tele[facility]icine from 09/03/2020 in Primary Care at  [facility]  PHQ-2 Total Score PHQ-9 Total Score Flowsheet Row ED from 06/08/2021 in [facility] ED from 01/05/2021 in [facility] ED to Hosp-Admission (Discharged) from 10/30/2020 in [facility]  C-SSRS RISK CATEGORY Low Risk No Risk High Risk        Musculoskeletal  Strength & Muscle Tone: within normal limits Gait & Station: normal Patient leans: N/A  Psychiatric Specialty Exam  Presentation  General Appearance: Appropriate for Environment; Casual  Eye Contact:Good  Speech:Clear and Coherent; Normal Rate  Speech Volume:Normal  Handedness:Right   Mood and Affect  Mood:Dysphoric; Depressed  Affect:Appropriate; Congruent; Depressed   Thought Process  Thought Processes:Coherent; Goal Directed; Linear  Descriptions of Associations:Intact  Orientation:Full (Time, Place and Person)  Thought Content:WDL; Logical  Diagnosis of Schizophrenia or Schizoaffective disorder in past: No    Hallucinations:Hallucinations: None  Ideas of Reference:None  Suicidal Thoughts:Suicidal Thoughts: No  Homicidal Thoughts:Homicidal Thoughts: No   Sensorium  Memory:Immediate Good; Recent Good; Remote Good  Judgment:Fair  Insight:Fair   Executive Functions  Concentration:Good  Attention Span:Good  Recall:Good  Fund of Knowledge:Good  Language:Good   Psychomotor Activity  Psychomotor Activity:Psychomotor Activity: Normal   Assets  Assets:Communication Skills; Desire for Improvement; Physical Health; Resilience; Housing; Social Support; Vocational/Educational   Sleep  Sleep:Sleep: Poor   No data recorded  Physical Exam  Physical Exam Constitutional:      Appearance: Normal appearance. He is normal weight.  HENT:     Head: Normocephalic and atraumatic.  Eyes:     Extraocular Movements: Extraocular movements intact.  Pulmonary:     Effort:  Pulmonary effort is normal.  Neurological:     General: No focal deficit present.     Mental Status: He is alert and oriented to person, place, and time.  Psychiatric:        Attention and Perception: Attention and perception normal.        Speech: Speech normal.        Behavior: Behavior normal. Behavior is cooperative.        Thought Content: Thought content normal.   Review of Systems  Constitutional:  Negative for chills and fever.  HENT:  Negative for hearing loss.   Eyes:  Negative for discharge and redness.  Respiratory:  Negative for cough.   Cardiovascular:  Negative for chest pain.  Gastrointestinal:  Negative for abdominal pain.  Musculoskeletal:  Negative for myalgias.  Neurological:  Negative for headaches.  Psychiatric/Behavioral:  Positive for depression and substance abuse. Negative for hallucinations and suicidal ideas. The patient is nervous/anxious and has insomnia.   Blood pressure (!) 145/97, pulse 63, temperature 97.6 F (36.4 C), temperature source Temporal, resp. rate 18, SpO2  100 %. There is no height or weight on file to calculate BMI.  Treatment Plan Summary: 35 year old male history of depression, anxiety, substance use (benzodiazepine and alcohol) who presents to [facility] for assessment of worsening mood and suicidal thoughts the past 2 weeks; patient is unable to contract for safety and is agreeable to admission to the [facility] at this time for crisis stabilization. Patient reports a history of substance use although states that he has not used benzodiazepines in 5-6 months. He reports relapsing on etoh over the last 2 weeks after ~3 months of sobriety due to increased depression and anxiety  with no identifiable trigger/stressor apart from starting a new job ~6-7 weeks ago. He reports compliance with home lexapro 20 mg, trazodone 100 mg qhs, and gabapentin 400 mg TID.   Patient reporting ongoing low mood today although denies SI/HI/AVH.  Patient remains appropriate for  continued treatment at the [facility] for continued crisis management and stabilization    MDD -contine lexapro 20 mg for mood -start seroquel 25 mg qhs as adjunct for mood for now; cholesterol elevated- will need to continue to monitor should seroquel be continued long term.    AUD, severe -continue 400 mg TID gabapentin for alcohol cravings -Patient reports consuming alcohol heavily this past 2 weeks after 3 months of sobriety d/t worsening anxiety and depression-will place on CIWA protocol out of caution although suspect there will be minimal/mild alcohol withdrawal sx based on reported frequency/duration of heavy alcohol usage -daily PO thiamine and multivitamin  Nicotine dependence -nicoderm cq 21 mg patch daily   HTN -continue home metoprolol 50 mg daily -continue home clonidine 0.1 mg TID   Dispo: ongoing. May be a candidate for IOP/PHP program after stabilization. Will consult with SW for assistance      Estella Husk, MD 06/09/2021 1:29 PM

## 2021-06-09 NOTE — ED Notes (Signed)
Refused breakfast.

## 2021-06-09 NOTE — Clinical Social Work Psych Note (Signed)
CSW Initial Note    CSW met with Eric Keller for introduction and to begin discussions regarding treatment and discharge planning.   Eric Keller reports he presented to the [facility] seeking treatment for worsening depression, anxiety and suicidal thoughts. Eric Keller reports worsening anxiety and depression for the past 2-3 weeks which has impacted his functioning at work. 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 He also shared that he continues to worry about losing his job. Eric Keller reports he currently lives with his father. He denied having any issues or concerns.   Eric Keller shared that he cannot identify any specific stressors or triggering events that contributed to his increase in symptomology. 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   Eric Keller reports he wants to decrease his depressive and anxiety symptoms while at the [facility].   CSW will provide resources for outpatient psychiatric and substance abuse services. Eric Keller reports he plans to return home with his father and follow up with outpatient services for continuity of care.    CSW will continue to follow until discharge.    Radonna Ricker, MSW, LCSW Clinical Education officer, museum ([place]) [facility]

## 2021-06-10 DIAGNOSIS — G4700 Insomnia, unspecified: Secondary | ICD-10-CM | POA: Diagnosis not present

## 2021-06-10 DIAGNOSIS — F332 Major depressive disorder, recurrent severe without psychotic features: Secondary | ICD-10-CM | POA: Diagnosis not present

## 2021-06-10 DIAGNOSIS — F411 Generalized anxiety disorder: Secondary | ICD-10-CM | POA: Diagnosis not present

## 2021-06-10 DIAGNOSIS — R45851 Suicidal ideations: Secondary | ICD-10-CM | POA: Diagnosis not present

## 2021-06-10 MED ORDER — QUETIAPINE FUMARATE 50 MG PO TABS
50.0000 mg | ORAL_TABLET | Freq: Every day | ORAL | Status: DC
  Administered 2021-06-10: 21:00:00 50 mg via ORAL
  Filled 2021-06-10: qty 1
  Filled 2021-06-10: qty 7

## 2021-06-10 NOTE — ED Notes (Signed)
Pt in room resting quietly.  No complaints of pain or discomfort at this time. Breathing is unlabored and even. Will continue to monitor for safety.

## 2021-06-10 NOTE — ED Notes (Signed)
Pt in dayroom calm and cooperative. No c/o pain or distress. A & O x 4. Will continue to monitor for safety

## 2021-06-10 NOTE — ED Notes (Signed)
Pt in room asleep. Did not come to dining room for dinner when offered.  Breathing is unlabored and even. Will continue to monitor for safety.

## 2021-06-10 NOTE — Clinical Social Work Psych Note (Addendum)
CSW Update  Jaiveer reports he is feeling better today. He denied having any SI, HI or AVH at this time.   Edelmiro shared that he believes his medications are working well at this time. He reports he is hoping to discharge home tomorrow. Jahmeek plans to return home with his father.   CSW has charted outpatient resources for medication management, therapy and substance abuse counseling in the patient's AVS.  Kabe reports he will have a support pick him up at discharge tomorrow.   Quintan denied having any additional questions or concerns at this time.     Baldo Daub, MSW, LCSW Clinical Child psychotherapist (Facility Based Crisis) [facility]

## 2021-06-10 NOTE — ED Notes (Signed)
Pt participating in AA group.

## 2021-06-10 NOTE — ED Notes (Signed)
Pt is currently sleeping, no distress noted, environmental check complete, will continue to monitor patient for safety. ? ?

## 2021-06-10 NOTE — ED Notes (Signed)
Pt refused breakfast 

## 2021-06-10 NOTE — Progress Notes (Signed)
Pt's CIWA was 3. Pt is awake, alert and oriented. Pt complained of mild headache. Pt declined PRN Tylenol when offered. No signs of acute distress noted. Administered scheduled meds with no incident. Pt denies current SI/HI/AVH. 15 minutes checks in effect. Staff will monitor for pt's safety.

## 2021-06-10 NOTE — ED Notes (Signed)
Sitting quietly in hall area waiting for mid-day medications.  No pain or discomfort noted or voiced.  Breathing is even and unlabored.  Pt took medications without issue. Will continue to monitor for safety.

## 2021-06-10 NOTE — ED Notes (Signed)
Pt in room asleep.  Breathing is unlabored and even. Will continue to monitor for safety.  ?

## 2021-06-10 NOTE — ED Provider Notes (Signed)
Behavioral Health Progress Note  Date and Time: 06/10/2021 10:40 AM Name: Eric Keller MRN:  [id number]  Subjective:   35 year old male history of depression, anxiety, substance use (benzodiazepine and alcohol) who presents to [facility] for assessment of worsening mood and suicidal thoughts on 12/19 and was admitted to the [facility] for further treatment. UDS+marijuana, etoh 47. Most recent CIWA 3.  Patient seen and chart reviewed.  He has been medication compliant.  Most recent CIWA 3.  Patient interviewed in his room this morning, he is found sitting on his bed in no acute distress replacing nicotine patch.  Affect appears brighter than yesterday.  Patient describes his mood as "not great but better".  He rates his mood as 3 out of 4 out of 10 (10 being the best).  He denies SI/HI/AVH.  Patient states that his sleep was improved from yesterday but that he still had frequent nighttime awakenings and was having "crazy dreams".  Patient denies going to sleep with nicotine patch on.  Patient denies all physical symptoms apart from some abdominal cramps which he attributes to anxiety ; states that he normally experiences physical symptoms associated with his anxiety.  Patient states he has been attending groups which he has found to be helpful and states that he would like to follow up with outpatient care at the [facility].  Patient expresses that he is unsure if he would like to do PHP/IOP due to his work; however, he would like information about it in case he changes his mind.  Discussed with patient the social workers associated with the program could  assist in filling out FMLA paperwork and any paperwork that is requested by his work-patient verbalized understanding but expresses concern as his job is fairly new.  Patient states "I think I am safe to leave today but I think I need 1 more day".  Patient states that he has a ride home tomorrow when he discharges.  Discussed medications  with patient-patient is agreeable to increasing Seroquel to 50 mg nightly to assist with sleep and mood.  Discussed with patient that he will be provided with 7-day samples of medications as well as a 30-day prescription-patient verbalizes understanding and is amenable.  No other concerns or questions at this time.  Patient is agreeable for continued treatment at the [facility].   Diagnosis:  Final diagnoses:  Severe episode of recurrent major depressive disorder, without psychotic features (HCC)    Total Time spent with patient: 20 minutes  Past Psychiatric History:  depression, anxiety, substance use (benzodiazepine and alcohol) Past Medical History:  Past Medical History:  Diagnosis Date   Alcohol withdrawal (HCC) 07/20/2019   Anxiety    Anxiety    Depression    H/O: substance abuse (HCC)    Hypertension     Past Surgical History:  Procedure Laterality Date   WISDOM TOOTH EXTRACTION     Family History:  Family History  Problem Relation Age of Onset   Cancer Mother    Hypertension Paternal Grandfather    Heart disease Paternal Grandfather        MIs   Heart disease Other    Stroke Other    Family Psychiatric  History:  Brother and sister with anxiety and depression Uncle with alcohol use No history of attempted or completed suicides; however, per chart review patient had previously reported his uncle died by suicide   Social History:  Social History   Substance and Sexual Activity  Alcohol Use Yes  Comment: daily 5th of liquor     Social History   Substance and Sexual Activity  Drug Use Not Currently   Types: Marijuana   Comment: Vicodin Abuse, BEnzos    Social History   Socioeconomic History   Marital status: Single    Spouse name: Not on file   Number of children: Not on file   Years of education: Not on file   Highest education level: Not on file  Occupational History   Not on file  Tobacco Use   Smoking status: Never    Passive exposure: Past    Smokeless tobacco: Never  Vaping Use   Vaping Use: Never used  Substance and Sexual Activity   Alcohol use: Yes    Comment: daily 5th of liquor   Drug use: Not Currently    Types: Marijuana    Comment: Vicodin Abuse, BEnzos   Sexual activity: Not Currently  Other Topics Concern   Not on file  Social History Narrative   Not on file   Social Determinants of Health   Financial Resource Strain: Not on file  Food Insecurity: Not on file  Transportation Needs: Not on file  Physical Activity: Not on file  Stress: Not on file  Social Connections: Not on file   SDOH:  SDOH Screenings   Alcohol Screen: Not on file  Depression (PHQ2-9): Medium Risk   PHQ-2 Score: 5  Financial Resource Strain: Not on file  Food Insecurity: Not on file  Housing: Not on file  Physical Activity: Not on file  Social Connections: Not on file  Stress: Not on file  Tobacco Use: Low Risk    Smoking Tobacco Use: Never   Smokeless Tobacco Use: Never   Passive Exposure: Past  Transportation Needs: Not on file   Additional Social History:    Pain Medications: pt denies Prescriptions: Trazadone ; Prozac ; Matoperal (  bid); Clonodine (.  tid); Gabapentine  tid Over the Counter: Benadryl when needed History of alcohol / drug use?: Yes Longest period of sobriety (when/how long): 4 years Negative Consequences of Use: Personal relationships, Work / Programmer, multimedia Withdrawal Symptoms: Agitation, Irritability, Nausea / Vomiting, Tremors, Sweats, Seizures, Patient aware of relationship between substance abuse and physical/medical complications, DTs Onset of Seizures: NA Date of most recent seizure: last year Name of Substance 1: ETOH Glass of wine yesterday Name of Substance 2: Benzodiazepines                Sleep:  improving  Appetite:  Fair  Current Medications:  Current Facility-Administered Medications  Medication Dose Route Frequency Provider Last Rate Last Admin   acetaminophen  (TYLENOL) tablet 650 mg  650 mg Oral Q6H PRN Estella Husk, MD       alum & mag hydroxide-simeth (MAALOX/MYLANTA) 200-200-20 MG/5ML suspension 30 mL  30 mL Oral Q4H PRN Estella Husk, MD       cloNIDine (CATAPRES) tablet 0.1 mg  0.1 mg Oral TID Estella Husk, MD   0.1 mg at 06/10/21 0950   escitalopram (LEXAPRO) tablet 20 mg  20 mg Oral Daily Estella Husk, MD   20 mg at 06/10/21 0951   gabapentin (NEURONTIN) capsule 400 mg  400 mg Oral TID Estella Husk, MD   400 mg at 06/10/21 0951   hydrOXYzine (ATARAX) tablet 25 mg  25 mg Oral TID PRN Estella Husk, MD   25 mg at 06/09/21 1541   hydrOXYzine (ATARAX) tablet 25 mg  25 mg Oral Q6H PRN  Estella Husk, MD   25 mg at 06/10/21 1610   loperamide (IMODIUM) capsule 2-4 mg  2-4 mg Oral PRN Estella Husk, MD   2 mg at 06/09/21 1544   LORazepam (ATIVAN) tablet 1 mg  1 mg Oral Q6H PRN Estella Husk, MD       magnesium hydroxide (MILK OF MAGNESIA) suspension 30 mL  30 mL Oral Daily PRN Estella Husk, MD       metoprolol tartrate (LOPRESSOR) tablet 50 mg  50 mg Oral BID Estella Husk, MD   50 mg at 06/10/21 9604   multivitamin with minerals tablet 1 tablet  1 tablet Oral Daily Estella Husk, MD   1 tablet at 06/10/21 0950   nicotine (NICODERM CQ - dosed in mg/24 hours) patch 21 mg  21 mg Transdermal Daily Estella Husk, MD   21 mg at 06/10/21 0951   ondansetron (ZOFRAN-ODT) disintegrating tablet 4 mg  4 mg Oral Q6H PRN Estella Husk, MD       QUEtiapine (SEROQUEL) tablet 25 mg  25 mg Oral QHS Estella Husk, MD   25 mg at 06/09/21 2108   thiamine tablet 100 mg  100 mg Oral Daily Estella Husk, MD   100 mg at 06/10/21 5409   traZODone (DESYREL) tablet 100 mg  100 mg Oral QHS Estella Husk, MD   100 mg at 06/09/21 2109   Current Outpatient Medications  Medication Sig Dispense Refill   cloNIDine (CATAPRES) 0.1 MG tablet TAKE 1 TABLET (0.1 MG  TOTAL) BY MOUTH 3 (THREE) TIMES DAILY. (Patient taking differently: Take 0.1 mg by mouth 3 (three) times daily.) 90 tablet 2   diphenhydrAMINE (BENADRYL) 25 MG tablet Take 25 mg by mouth at bedtime as needed for allergies or sleep.     escitalopram (LEXAPRO) 10 MG tablet TAKE 2 TABLETS (20 MG TOTAL) BY MOUTH DAILY. (Patient taking differently: Take 10 mg by mouth 2 (two) times daily.) 60 tablet 2   metoprolol tartrate (LOPRESSOR) 50 MG tablet TAKE 1 TABLET (50 MG TOTAL) BY MOUTH 2 (TWO) TIMES DAILY. (Patient taking differently: Take 50 mg by mouth 2 (two) times daily.) 60 tablet 2   traZODone (DESYREL) 100 MG tablet Take 1 tablet (100 mg total) by mouth at bedtime. (Patient taking differently: Take 100 mg by mouth at bedtime as needed for sleep.) 30 tablet 0   gabapentin (NEURONTIN) 400 MG capsule TAKE 1 CAPSULE (400 MG TOTAL) BY MOUTH 3 (THREE) TIMES DAILY. (Patient taking differently: Take 400 mg by mouth 3 (three) times daily.) 270 capsule 1    Labs  Lab Results:  Admission on 06/08/2021  Component Date Value Ref Range Status   SARS Coronavirus 2 by RT PCR 06/08/2021 NEGATIVE  NEGATIVE Final   Comment: (NOTE) SARS-CoV-2 target nucleic acids are NOT DETECTED.  The SARS-CoV-2 RNA is generally detectable in upper respiratory specimens during the acute phase of infection. The lowest concentration of SARS-CoV-2 viral copies this assay can detect is 138 copies/mL. A negative result does not preclude SARS-Cov-2 infection and should not be used as the sole basis for treatment or other patient management decisions. A negative result may occur with  improper specimen collection/handling, submission of specimen other than nasopharyngeal swab, presence of viral mutation(s) within the areas targeted by this assay, and inadequate number of viral copies(<138 copies/mL). A negative result must be combined with clinical observations, patient history, and epidemiological information. The expected result  is Negative.  Fact Sheet for Patients:  BloggerCourse.com  Fact Sheet for Healthcare Providers:  SeriousBroker.it  This test is no                          t yet approved or cleared by the [place] FDA and  has been authorized for detection and/or diagnosis of SARS-CoV-2 by FDA under an Emergency Use Authorization (EUA). This EUA will remain  in effect (meaning this test can be used) for the duration of the COVID-19 declaration under Section 564(b)(1) of the Act, 21 U.S.C.section 360bbb-3(b)(1), unless the authorization is terminated  or revoked sooner.       Influenza A by PCR 06/08/2021 NEGATIVE  NEGATIVE Final   Influenza B by PCR 06/08/2021 NEGATIVE  NEGATIVE Final   Comment: (NOTE) The Xpert Xpress SARS-CoV-2/FLU/RSV plus assay is intended as an aid in the diagnosis of influenza from Nasopharyngeal swab specimens and should not be used as a sole basis for treatment. Nasal washings and aspirates are unacceptable for Xpert Xpress SARS-CoV-2/FLU/RSV testing.  Fact Sheet for Patients: BloggerCourse.com  Fact Sheet for Healthcare Providers: SeriousBroker.it  This test is not yet approved or cleared by the [place] FDA and has been authorized for detection and/or diagnosis of SARS-CoV-2 by FDA under an Emergency Use Authorization (EUA). This EUA will remain in effect (meaning this test can be used) for the duration of the COVID-19 declaration under Section 564(b)(1) of the Act, 21 U.S.C. section 360bbb-3(b)(1), unless the authorization is terminated or revoked.  Performed at [facility], [address] [place], [place] [address]    WBC 06/08/2021 7.3  4.0 - 10.5 K/uL Final   RBC 06/08/2021 6.40 (H)  4.22 - 5.81 MIL/uL Final   Hemoglobin 06/08/2021 19.6 (H)  13.0 - 17.0 g/dL Final   HCT 60/45/4098 57.2 (H)  39.0 - 52.0 % Final   MCV 06/08/2021 89.4  80.0 -  100.0 fL Final   MCH 06/08/2021 30.6  26.0 - 34.0 pg Final   MCHC 06/08/2021 34.3  30.0 - 36.0 g/dL Final   RDW 11/91/4782 15.1  11.5 - 15.5 % Final   Platelets 06/08/2021 239  150 - 400 K/uL Final   nRBC 06/08/2021 0.0  0.0 - 0.2 % Final   Neutrophils Relative % 06/08/2021 59  % Final   Neutro Abs 06/08/2021 4.3  1.7 - 7.7 K/uL Final   Lymphocytes Relative 06/08/2021 29  % Final   Lymphs Abs 06/08/2021 2.1  0.7 - 4.0 K/uL Final   Monocytes Relative 06/08/2021 9  % Final   Monocytes Absolute 06/08/2021 0.7  0.1 - 1.0 K/uL Final   Eosinophils Relative 06/08/2021 2  % Final   Eosinophils Absolute 06/08/2021 0.1  0.0 - 0.5 K/uL Final   Basophils Relative 06/08/2021 1  % Final   Basophils Absolute 06/08/2021 0.0  0.0 - 0.1 K/uL Final   Immature Granulocytes 06/08/2021 0  % Final   Abs Immature Granulocytes 06/08/2021 0.02  0.00 - 0.07 K/uL Final   Performed at [facility], [address], [place], [place] [address]   Sodium 06/08/2021 138  135 - 145 mmol/L Final   Potassium 06/08/2021 4.6  3.5 - 5.1 mmol/L Final   Chloride 06/08/2021 99  98 - 111 mmol/L Final   CO2 06/08/2021 28  22 - 32 mmol/L Final   Glucose, Bld 06/08/2021 118 (H)  70 - 99 mg/dL Final   Glucose reference range applies only  to samples taken after fasting for at least 8 hours.   BUN 06/08/2021 5 (L)  6 - 20 mg/dL Final   Creatinine, Ser 06/08/2021 1.17  0.61 - 1.24 mg/dL Final   Calcium 16/03/9603 10.2  8.9 - 10.3 mg/dL Final   Total Protein 54/02/8118 8.2 (H)  6.5 - 8.1 g/dL Final   Albumin 14/78/2956 4.7  3.5 - 5.0 g/dL Final   AST 21/30/8657 51 (H)  15 - 41 U/L Final   ALT 06/08/2021 77 (H)  0 - 44 U/L Final   Alkaline Phosphatase 06/08/2021 52  38 - 126 U/L Final   Total Bilirubin 06/08/2021 0.8  0.3 - 1.2 mg/dL Final   GFR, Estimated 06/08/2021 >60  >60 mL/min Final   Comment: (NOTE) Calculated using the CKD-EPI Creatinine Equation (2021)    Anion gap 06/08/2021 11  5 - 15 Final   Performed at [facility], [address] [place], [place] [address]   Hgb A1c MFr Bld 06/08/2021 5.7 (H)  4.8 - 5.6 % Final   Comment: (NOTE) Pre diabetes:          5.7%-6.4%  Diabetes:              >6.4%  Glycemic control for   <7.0% adults with diabetes    Mean Plasma Glucose 06/08/2021 116.89  mg/dL Final   Performed at [facility], [address] [place], [place] [address]   Alcohol, Ethyl (B) 06/08/2021 44 (H)  <10 mg/dL Final   Comment: (NOTE) Lowest detectable limit for serum alcohol is 10 mg/dL.  For medical purposes only. Performed at [facility], [address] [place], [place] [address]    Cholesterol 06/08/2021 288 (H)  0 - 200 mg/dL Final   Triglycerides 40/03/2724 258 (H)  <150 mg/dL Final   HDL 36/64/4034 40 (L)  >40 mg/dL Final   Total CHOL/HDL Ratio 06/08/2021 7.2  RATIO Final   VLDL 06/08/2021 52 (H)  0 - 40 mg/dL Final   LDL Cholesterol 06/08/2021 196 (H)  0 - 99 mg/dL Final   Comment:        Total Cholesterol/HDL:CHD Risk Coronary Heart Disease Risk Table                     Men   Women  1/2 Average Risk   3.4   3.3  Average Risk       5.0   4.4  2 X Average Risk   9.6   7.1  3 X Average Risk  23.4   11.0        Use the calculated Patient Ratio above and the CHD Risk Table to determine the patient's CHD Risk.        ATP III CLASSIFICATION (LDL):  <100     mg/dL   Optimal  742-595  mg/dL   Near or Above                    Optimal  130-159  mg/dL   Borderline  638-756  mg/dL   High  >433     mg/dL   Very High Performed at [facility], [address] [place], [place] [address]    TSH 06/08/2021 1.306  0.350 - 4.500 uIU/mL Final   Comment: Performed by a 3rd Generation assay with a functional sensitivity of <=0.01 uIU/mL. Performed at [facility], [address] [place], [place] [address]  Color, Urine 06/08/2021 YELLOW  YELLOW Final   APPearance 06/08/2021 CLEAR  CLEAR Final   Specific Gravity, Urine 06/08/2021 1.010  1.005  - 1.030 Final   pH 06/08/2021 6.0  5.0 - 8.0 Final   Glucose, UA 06/08/2021 NEGATIVE  NEGATIVE mg/dL Final   Hgb urine dipstick 06/08/2021 NEGATIVE  NEGATIVE Final   Bilirubin Urine 06/08/2021 NEGATIVE  NEGATIVE Final   Ketones, ur 06/08/2021 NEGATIVE  NEGATIVE mg/dL Final   Protein, ur 30/13/1438 NEGATIVE  NEGATIVE mg/dL Final   Nitrite 88/75/7972 NEGATIVE  NEGATIVE Final   Leukocytes,Ua 06/08/2021 NEGATIVE  NEGATIVE Final   Comment: Microscopic not done on urines with negative protein, blood, leukocytes, nitrite, or glucose < 500 mg/dL. Performed at [facility], [address], [place], [place] [address]    POC Amphetamine UR 06/08/2021 None Detected  NONE DETECTED (Cut Off Level 1000 ng/mL) Final   POC Secobarbital (BAR) 06/08/2021 None Detected  NONE DETECTED (Cut Off Level 300 ng/mL) Final   POC Buprenorphine (BUP) 06/08/2021 None Detected  NONE DETECTED (Cut Off Level 10 ng/mL) Final   POC Oxazepam (BZO) 06/08/2021 None Detected  NONE DETECTED (Cut Off Level 300 ng/mL) Final   POC Cocaine UR 06/08/2021 None Detected  NONE DETECTED (Cut Off Level 300 ng/mL) Final   POC Methamphetamine UR 06/08/2021 None Detected  NONE DETECTED (Cut Off Level 1000 ng/mL) Final   POC Morphine 06/08/2021 None Detected  NONE DETECTED (Cut Off Level 300 ng/mL) Final   POC Oxycodone UR 06/08/2021 None Detected  NONE DETECTED (Cut Off Level 100 ng/mL) Final   POC Methadone UR 06/08/2021 None Detected  NONE DETECTED (Cut Off Level 300 ng/mL) Final   POC Marijuana UR 06/08/2021 Positive (A)  NONE DETECTED (Cut Off Level 50 ng/mL) Final   SARS Coronavirus 2 Ag 06/08/2021 Negative  Negative Final   SARSCOV2ONAVIRUS 2 AG 06/08/2021 NEGATIVE  NEGATIVE Final   Comment: (NOTE) SARS-CoV-2 antigen NOT DETECTED.   Negative results are presumptive.  Negative results do not preclude SARS-CoV-2 infection and should not be used as the sole basis for treatment or other patient management decisions, including  infection  control decisions, particularly in the presence of clinical signs and  symptoms consistent with COVID-19, or in those who have been in contact with the virus.  Negative results must be combined with clinical observations, patient history, and epidemiological information. The expected result is Negative.  Fact Sheet for Patients: https://www.jennings-kim.com/  Fact Sheet for Healthcare Providers: https://alexander-rogers.biz/  This test is not yet approved or cleared by the [place] FDA and  has been authorized for detection and/or diagnosis of SARS-CoV-2 by FDA under an Emergency Use Authorization (EUA).  This EUA will remain in effect (meaning this test can be used) for the duration of  the COV                          ID-19 declaration under Section 564(b)(1) of the Act, 21 U.S.C. section 360bbb-3(b)(1), unless the authorization is terminated or revoked sooner.      Blood Alcohol level:  Lab Results  Component Value Date   ETH 44 (H) 06/08/2021   ETH 18 (H) 10/30/2020    Metabolic Disorder Labs: Lab Results  Component Value Date   HGBA1C 5.7 (H) 06/08/2021   MPG 116.89 06/08/2021   No results found for: PROLACTIN Lab Results  Component Value Date   CHOL 288 (H) 06/08/2021   TRIG 258 (H) 06/08/2021  HDL 40 (L) 06/08/2021   CHOLHDL 7.2 06/08/2021   VLDL 52 (H) 06/08/2021   LDLCALC 196 (H) 06/08/2021    Therapeutic Lab Levels: No results found for: LITHIUM No results found for: VALPROATE No components found for:  CBMZ  Physical Findings   AIMS    Flowsheet Row [facility]  AIMS Total Score 0      AUDIT    Flowsheet Row [facility]  Alcohol Use Disorder Identification Test Final Score (AUDIT) 12      CAGE-AID    Flowsheet Row ED to Hosp-Admission (Discharged)  from 10/30/2020 in [facility]  CAGE-AID Score 4      GAD-7    Flowsheet Row Video Visit from 05/11/2021 in Primary Care at [facility] Telemedicine from 11/20/2020 in Primary Care at [facility] Telemedicine from 09/03/2020 in Primary Care at [facility] Telemedicine from 07/03/2020 in Primary Care at [facility] Telemedicine from 05/27/2020 in Primary Care at [facility]  Total GAD-7 Score PHQ2-9    Flowsheet Row Video Visit from 05/11/2021 in Primary Care at [facility] Office Visit from 02/03/2021 in Primary Care at [facility] Telemedicine from 11/20/2020 in Primary Care at [facility] Video Visit from 09/08/2020 in [facility] Telemedicine from 09/03/2020 in Primary Care at [facility]  PHQ-2 Total Score PHQ-9 Total Score Flowsheet Row ED from 06/08/2021 in [facility] ED from 01/05/2021 in [facility] ED to Hosp-Admission (Discharged) from 10/30/2020 in [facility]  C-SSRS RISK CATEGORY Low Risk No Risk High Risk        Musculoskeletal  Strength & Muscle Tone: within normal limits Gait & Station: normal Patient leans: N/A  Psychiatric Specialty Exam  Presentation  General Appearance: Appropriate for Environment; Casual  Eye Contact:Good  Speech:Clear and Coherent; Normal Rate  Speech Volume:Normal  Handedness:Right   Mood and Affect  Mood:Dysphoric ("not great but better"; rates 3-4/10 (10 being the best))  Affect:Appropriate; Congruent; Other (comment) (overall dysphoric, but brighter than yesterday)   Thought Process  Thought Processes:Linear; Goal Directed; Coherent  Descriptions of Associations:Intact  Orientation:Full (Time, Place and Person)  Thought Content:WDL; Logical  Diagnosis of Schizophrenia or Schizoaffective disorder in past: No     Hallucinations:Hallucinations: None  Ideas of Reference:None  Suicidal Thoughts:Suicidal Thoughts: No  Homicidal Thoughts:Homicidal Thoughts: No   Sensorium  Memory:Immediate Good; Recent Good; Remote Good  Judgment:Good  Insight:Fair   Executive Functions  Concentration:Good  Attention Span:Good  Recall:Good  Fund of Knowledge:Good  Language:Good   Psychomotor Activity  Psychomotor Activity:Psychomotor Activity: Normal   Assets  Assets:Communication Skills; Desire for Improvement; Housing; Physical Health; Resilience; Social Support; Vocational/Educational   Sleep  Sleep:Sleep: Fair   No data recorded  Physical Exam  Physical Exam Constitutional:      Appearance: Normal appearance. He is normal weight.  HENT:     Head: Normocephalic and atraumatic.  Eyes:     Extraocular Movements: Extraocular movements intact.     Conjunctiva/sclera: Conjunctivae normal.  Pulmonary:     Effort: Pulmonary effort is normal.  Neurological:     General: No focal deficit present.     Mental Status: He is alert  and oriented to person, place, and time.  Psychiatric:        Attention and Perception: Attention and perception normal.        Speech: Speech normal.        Behavior: Behavior normal. Behavior is cooperative.        Thought Content: Thought content normal.   Review of Systems  Constitutional:  Negative for chills and fever.  HENT:  Negative for hearing loss.   Eyes:  Negative for discharge and redness.  Respiratory:  Negative for cough.   Cardiovascular:  Negative for chest pain.  Gastrointestinal:  Positive for abdominal pain.       GI upset which he attributes to anxiety  Musculoskeletal:  Negative for myalgias.  Neurological:  Negative for headaches.  Psychiatric/Behavioral:  Positive for depression and substance abuse. Negative for hallucinations and suicidal ideas. The patient is nervous/anxious and has insomnia.   Blood pressure (!) 146/86, pulse 65,  temperature 98.6 F (37 C), temperature source Oral, resp. rate 18, SpO2 99 %. There is no height or weight on file to calculate BMI.  Treatment Plan Summary: 35 year old male history of depression, anxiety, substance use (benzodiazepine and alcohol) who presented to the [facility] on 12/19 for assessment of worsening mood and suicidal thoughts the past 2 weeks; patient is unable to contract for safety and is agreeable to admission to the [facility] at this time for crisis stabilization; he was placed on CIWA protocol. UDS+marijuana, etoh 47. Patient reports a history of substance use although states that he has not used benzodiazepines in 5-6 months. He reports relapsing on etoh over the last 2 weeks after ~3 months of sobriety due to increased depression and anxiety  with no identifiable trigger/stressor apart from starting a new job ~6-7 weeks ago and the holidays being more difficult. He reports compliance with home lexapro 20 mg, trazodone 100 mg qhs, and gabapentin 400 mg TID.   Patient tolerated Seroquel 25 mg nightly well without any notable side effect; he was agreeable to increasing dose to 50 mg starting tonight.  Reporting ongoing low mood today with some improvement; denies SI/HI/AVH.  Patient remains appropriate for continued treatment at the [facility] for continued crisis management and stabilization. Anticipate discharge tomorrow.     MDD -contine lexapro 20 mg for mood -increase seroquel to 50 mg qhs tonight 12/21; started 25 mg 12/20.--cholesterol elevated- will need to continue to monitor should seroquel be continued long term.    AUD, severe -continue 400 mg TID gabapentin for alcohol cravings -Patient reports consuming alcohol heavily this past 2 weeks after 3 months of sobriety d/t worsening anxiety and depression-will place on CIWA protocol out of caution although suspect there will be minimal/mild alcohol withdrawal sx based on reported frequency/duration of heavy alcohol usage -daily PO  thiamine and multivitamin  Nicotine dependence -nicoderm cq 21 mg patch daily   HTN -continue home metoprolol 50 mg daily -continue home clonidine 0.1 mg TID   Dispo: ongoing.Anticipate discharge tomorrow back to residence      Estella Husk, MD 06/10/2021 10:40 AM

## 2021-06-10 NOTE — ED Notes (Signed)
Pt eating lunch

## 2021-06-11 DIAGNOSIS — F332 Major depressive disorder, recurrent severe without psychotic features: Secondary | ICD-10-CM | POA: Diagnosis not present

## 2021-06-11 DIAGNOSIS — F411 Generalized anxiety disorder: Secondary | ICD-10-CM

## 2021-06-11 DIAGNOSIS — G4700 Insomnia, unspecified: Secondary | ICD-10-CM

## 2021-06-11 DIAGNOSIS — R45851 Suicidal ideations: Secondary | ICD-10-CM | POA: Diagnosis not present

## 2021-06-11 MED ORDER — METOPROLOL TARTRATE 50 MG PO TABS
50.0000 mg | ORAL_TABLET | Freq: Two times a day (BID) | ORAL | 0 refills | Status: DC
  Filled 2021-06-16: qty 60, 30d supply, fill #0

## 2021-06-11 MED ORDER — GABAPENTIN 400 MG PO CAPS
400.0000 mg | ORAL_CAPSULE | Freq: Three times a day (TID) | ORAL | 0 refills | Status: DC
  Filled 2021-06-16: qty 90, 30d supply, fill #0

## 2021-06-11 MED ORDER — TRAZODONE HCL 100 MG PO TABS: 100.0000 mg | ORAL_TABLET | Freq: Every evening | ORAL | 0 refills | Status: DC | PRN

## 2021-06-11 MED ORDER — QUETIAPINE FUMARATE 50 MG PO TABS
50.0000 mg | ORAL_TABLET | Freq: Every day | ORAL | 0 refills | Status: DC
  Filled 2021-06-16: qty 30, 30d supply, fill #0

## 2021-06-11 MED ORDER — ESCITALOPRAM OXALATE 20 MG PO TABS
20.0000 mg | ORAL_TABLET | Freq: Every day | ORAL | 0 refills | Status: DC
Start: 2021-06-12 — End: 2021-06-29
  Filled 2021-06-16: qty 30, 30d supply, fill #0

## 2021-06-11 MED ORDER — HYDROXYZINE HCL 25 MG PO TABS
25.0000 mg | ORAL_TABLET | Freq: Three times a day (TID) | ORAL | 0 refills | Status: DC | PRN
  Filled 2021-06-16: qty 30, 10d supply, fill #0

## 2021-06-11 MED ORDER — THIAMINE HCL 100 MG PO TABS
100.0000 mg | ORAL_TABLET | Freq: Every day | ORAL | 0 refills | Status: AC
Start: 2021-06-12 — End: ?

## 2021-06-11 MED ORDER — CLONIDINE HCL 0.1 MG PO TABS
0.1000 mg | ORAL_TABLET | Freq: Three times a day (TID) | ORAL | 0 refills | Status: DC
  Filled 2021-06-16: qty 90, 30d supply, fill #0

## 2021-06-11 MED ORDER — ADULT MULTIVITAMIN W/MINERALS CH: 1.0000 | ORAL_TABLET | Freq: Every day | ORAL | 0 refills | Status: AC

## 2021-06-11 NOTE — ED Provider Notes (Signed)
FBC/OBS ASAP Discharge Summary  Date and Time: 06/11/2021 11:05 AM  Name: Eric Keller  MRN:  [id number]   Discharge Diagnoses:  Final diagnoses:  Severe episode of recurrent major depressive disorder, without psychotic features ([place])  Insomnia, unspecified type  Generalized anxiety disorder    Subjective:  Patient seen and chart reviewed.  Patient has been medication compliant and has been appropriate with staff and peers on the unit.  Patient describes his mood as "better than it has been".  He rates his mood a 4 out of 10 (10 being the best).  Patient indicates that he did not sleep particularly well last night but attributes this to sleeping a lot during the day.  He denies SI/HI/AVH.  He indicates that he feels ready for discharge and feels he can keep himself safe at discharge; however, does report having some anxiety related to discharge.  Discussed with patient that anxiety is normal upon discharge from hospital returning home.  Return precautions given advised patient that he may present to the [facility] if he has concerns about safety.  Patient verbalizes understanding and expresses that he is happy that this resource is available in the community.  Patient's only physical complaint at this time is GI upset which he attributes to anxiety.  Patient informed that he will be provided with 7-day samples as well as printed scripts.  Stay Summary:  35 year old male history of depression, anxiety, substance use (benzodiazepine and alcohol) who presents to [facility] for assessment of worsening mood and suicidal thoughts on 12/19 and was admitted to the [facility] for further treatment. UDS+marijuana, etoh 47. Labs notable for elevated cholesterol and triglycerides , mildly elevated liver enzymes (AST 51 and ALT 77) and slightly elevated a1c 5.7% (upper end of normal 5.6%). Otherwise labs unremarkable .  He was continued on his home blood pressure medications of metoprolol 50 mg twice daily and clonidine 0.1 mg 3  times daily throughout his stay.  He was continued on home 400 mg gabapentin 3 times daily for alcohol cravings and Lexapro 20 mg for mood throughout his stay.  He was placed on CIWA protocol due to reported recent alcohol intake; however, he did not receive any as needed Ativan for elevated CIWA scores.  Seroquel 25 mg was started 12/20 as adjunct for mood and was increased to 50 mg nightly on 12/21.  Patient tolerated medication without side effects/adverse events. Lipids and a1c will need to be monitored in the outpatient setting if he continues to remain on seroquel long term.  Patient attended groups throughout his stay and was appropriate with staff and peers.  Emotional and mental status and Improvement were monitored  daily by clinical staff.Marland Kitchen        Upon completion of this admission the Eric Keller was both mentally and medically stable for discharge denying suicidal/homicidal ideation, symptoms that would be consistent with psychosis (AVH, IOR, paranoia, etc).   On my interview today, day of discharge, , patient is in NAD, alert, oriented, calm, cooperative, and attentive, with normal affect, speech, and behavior. Objectively, there is no evidence of psychosis/ mania (able to converse coherently, linear and goal directed thought, no RIS, no distractibility, not pre-occupied, no FOI, etc) nor depression to the point of suicidality (able to concentrate, affect full and reactive, speech normal r/v/t, no psychomotor retardation/agitation, etc).  Overall, patient appears to be at the point, in the absence of inhibiting or disinhibiting symptoms, where he can successfully move to lesser restrictive setting for care.  Total Time spent with patient: 20 minutes  Past Psychiatric History: depression, anxiety, substance use (benzodiazepine and alcohol) Past Medical History:  Past Medical History:  Diagnosis Date   Alcohol withdrawal (HCC) 07/20/2019   Anxiety    Anxiety    Depression     H/O: substance abuse (HCC)    Hypertension     Past Surgical History:  Procedure Laterality Date   WISDOM TOOTH EXTRACTION     Family History:  Family History  Problem Relation Age of Onset   Cancer Mother    Hypertension Paternal Grandfather    Heart disease Paternal Grandfather        MIs   Heart disease Other    Stroke Other    Family Psychiatric History:  Brother and sister with anxiety and depression Uncle with alcohol use No history of attempted or completed suicides; however, per chart review patient had previously reported his uncle died by suicide Social History:  Social History   Substance and Sexual Activity  Alcohol Use Yes   Comment: daily 5th of liquor     Social History   Substance and Sexual Activity  Drug Use Not Currently   Types: Marijuana   Comment: Vicodin Abuse, BEnzos    Social History   Socioeconomic History   Marital status: Single    Spouse name: Not on file   Number of children: Not on file   Years of education: Not on file   Highest education level: Not on file  Occupational History   Not on file  Tobacco Use   Smoking status: Never    Passive exposure: Past   Smokeless tobacco: Never  Vaping Use   Vaping Use: Never used  Substance and Sexual Activity   Alcohol use: Yes    Comment: daily 5th of liquor   Drug use: Not Currently    Types: Marijuana    Comment: Vicodin Abuse, BEnzos   Sexual activity: Not Currently  Other Topics Concern   Not on file  Social History Narrative   Not on file   Social Determinants of Health   Financial Resource Strain: Not on file  Food Insecurity: Not on file  Transportation Needs: Not on file  Physical Activity: Not on file  Stress: Not on file  Social Connections: Not on file   SDOH:  SDOH Screenings   Alcohol Screen: Not on file  Depression (PHQ2-9): Medium Risk   PHQ-2 Score: 5  Financial Resource Strain: Not on file  Food Insecurity: Not on file  Housing: Not on file   Physical Activity: Not on file  Social Connections: Not on file  Stress: Not on file  Tobacco Use: Low Risk    Smoking Tobacco Use: Never   Smokeless Tobacco Use: Never   Passive Exposure: Past  Transportation Needs: Not on file    Tobacco Cessation:  Prescription not provided because: declined  Current Medications:  Current Facility-Administered Medications  Medication Dose Route Frequency Provider Last Rate Last Admin   acetaminophen (TYLENOL) tablet 650 mg  650 mg Oral Q6H PRN Estella Husk, MD       alum & mag hydroxide-simeth (MAALOX/MYLANTA) 200-200-20 MG/5ML suspension 30 mL  30 mL Oral Q4H PRN Estella Husk, MD       cloNIDine (CATAPRES) tablet 0.1 mg  0.1 mg Oral TID Estella Husk, MD   0.1 mg at 06/11/21 0931   escitalopram (LEXAPRO) tablet 20 mg  20 mg Oral Daily Estella Husk, MD   20  mg at 06/11/21 0931   gabapentin (NEURONTIN) capsule 400 mg  400 mg Oral TID Estella Husk, MD   400 mg at 06/11/21 1517   hydrOXYzine (ATARAX) tablet 25 mg  25 mg Oral TID PRN Estella Husk, MD   25 mg at 06/11/21 0931   hydrOXYzine (ATARAX) tablet 25 mg  25 mg Oral Q6H PRN Estella Husk, MD   25 mg at 06/10/21 1815   loperamide (IMODIUM) capsule 2-4 mg  2-4 mg Oral PRN Estella Husk, MD   2 mg at 06/10/21 1815   LORazepam (ATIVAN) tablet 1 mg  1 mg Oral Q6H PRN Estella Husk, MD       magnesium hydroxide (MILK OF MAGNESIA) suspension 30 mL  30 mL Oral Daily PRN Estella Husk, MD       metoprolol tartrate (LOPRESSOR) tablet 50 mg  50 mg Oral BID Estella Husk, MD   50 mg at 06/11/21 6160   multivitamin with minerals tablet 1 tablet  1 tablet Oral Daily Estella Husk, MD   1 tablet at 06/10/21 0950   nicotine (NICODERM CQ - dosed in mg/24 hours) patch 21 mg  21 mg Transdermal Daily Estella Husk, MD   21 mg at 06/11/21 0941   ondansetron (ZOFRAN-ODT) disintegrating tablet 4 mg  4 mg Oral Q6H PRN Estella Husk, MD       QUEtiapine (SEROQUEL) tablet 50 mg  50 mg Oral QHS Estella Husk, MD   50 mg at 06/10/21 2111   thiamine tablet 100 mg  100 mg Oral Daily Estella Husk, MD   100 mg at 06/11/21 7371   traZODone (DESYREL) tablet 100 mg  100 mg Oral QHS Estella Husk, MD   100 mg at 06/10/21 2111   Current Outpatient Medications  Medication Sig Dispense Refill   cloNIDine (CATAPRES) 0.1 MG tablet Take 1 tablet (0.1 mg total) by mouth 3 (three) times daily. 90 tablet 0   [START ON 06/12/2021] escitalopram (LEXAPRO) 20 MG tablet Take 1 tablet (20 mg total) by mouth daily. 30 tablet 0   gabapentin (NEURONTIN) 400 MG capsule Take 1 capsule (400 mg total) by mouth 3 (three) times daily. 90 capsule 0   hydrOXYzine (ATARAX) 25 MG tablet Take 1 tablet (25 mg total) by mouth 3 (three) times daily as needed for anxiety. 30 tablet 0   metoprolol tartrate (LOPRESSOR) 50 MG tablet Take 1 tablet (50 mg total) by mouth 2 (two) times daily. 60 tablet 0   [START ON 06/12/2021] Multiple Vitamin (MULTIVITAMIN WITH MINERALS) TABS tablet Take 1 tablet by mouth daily. 30 tablet 0   QUEtiapine (SEROQUEL) 50 MG tablet Take 1 tablet (50 mg total) by mouth at bedtime. 30 tablet 0   [START ON 06/12/2021] thiamine 100 MG tablet Take 1 tablet (100 mg total) by mouth daily. 30 tablet 0   traZODone (DESYREL) 100 MG tablet Take 1 tablet (100 mg total) by mouth at bedtime as needed for sleep. 30 tablet 0    PTA Medications: (Not in a hospital admission)   Musculoskeletal  Strength & Muscle Tone: within normal limits Gait & Station: normal Patient leans: N/A  Psychiatric Specialty Exam  Presentation  General Appearance: Appropriate for Environment; Casual  Eye Contact:Good  Speech:Clear and Coherent; Normal Rate  Speech Volume:Normal  Handedness:Right   Mood and Affect  Mood:Euthymic ("better than it has been")  Affect:Appropriate; Congruent; Other (comment) (brigher than  yeserday)  Thought Process  Thought Processes:Coherent; Goal Directed; Linear  Descriptions of Associations:Intact  Orientation:Full (Time, Place and Person)  Thought Content:WDL; Logical  Diagnosis of Schizophrenia or Schizoaffective disorder in past: No    Hallucinations:Hallucinations: None  Ideas of Reference:None  Suicidal Thoughts:Suicidal Thoughts: No  Homicidal Thoughts:Homicidal Thoughts: No   Sensorium  Memory:Immediate Good; Recent Good; Remote Good  Judgment:Good  Insight:Good   Executive Functions  Concentration:Good  Attention Span:Good  [place] of Knowledge:Good  Language:Good   Psychomotor Activity  Psychomotor Activity:Psychomotor Activity: Normal   Assets  Assets:Communication Skills; Desire for Improvement; Housing; Physical Health; Resilience; Social Support; Transportation; Vocational/Educational   Sleep  Sleep:Sleep: Fair   No data recorded  Physical Exam  Physical Exam Constitutional:      Appearance: Normal appearance. He is normal weight.  HENT:     Head: Normocephalic and atraumatic.  Eyes:     Extraocular Movements: Extraocular movements intact.  Pulmonary:     Effort: Pulmonary effort is normal.  Neurological:     General: No focal deficit present.     Mental Status: He is alert and oriented to person, place, and time.  Psychiatric:        Attention and Perception: Attention and perception normal.        Speech: Speech normal.        Behavior: Behavior normal. Behavior is cooperative.        Thought Content: Thought content normal.   Review of Systems  Constitutional:  Negative for chills and fever.  HENT:  Negative for hearing loss.   Eyes:  Negative for discharge and redness.  Respiratory:  Negative for cough.   Cardiovascular:  Negative for chest pain.  Gastrointestinal:  Positive for abdominal pain.       Mild GI upset that he attributes to anxiety   Musculoskeletal:  Negative for myalgias.   Neurological:  Negative for headaches.  Blood pressure (!) 142/93, pulse 62, temperature (!) 97.5 F (36.4 C), temperature source Temporal, resp. rate 18, SpO2 99 %. There is no height or weight on file to calculate BMI.  Demographic Factors:  Male and Caucasian  Loss Factors: NA  Historical Factors: Impulsivity  Risk Reduction Factors:   Sense of responsibility to family, Employed, Living with another person, especially a relative, Positive social support, and Positive coping skills or problem solving skills  Continued Clinical Symptoms:  Depression:   Comorbid alcohol abuse/dependence Recent sense of peace/wellbeing Alcohol/Substance Abuse/Dependencies  Cognitive Features That Contribute To Risk:  None    Suicide Risk:  Moderate:  Frequent suicidal ideation with limited intensity, and duration, some specificity in terms of plans, no associated intent, good self-control, limited dysphoria/symptomatology, some risk factors present, and identifiable protective factors, including available and accessible social support.  Plan Of Care/Follow-up recommendations:  Activity:  as toelrated Diet:  regular Other:     Take all medications as prescribed by his/her mental healthcare provider. Report any adverse effects and or reactions from the medicines to your outpatient provider promptly. Do not engage in alcohol and or illegal drug use while on prescription medicines. In the event of worsening symptoms, call the crisis hotline, 911 and or go to the nearest ED for appropriate evaluation and treatment of symptoms. follow-up with your primary care provider for your other medical issues, concerns and or health care needs.    TAKE these medications    cloNIDine 0.1 MG tablet Commonly known as: CATAPRES Take 1 tablet (0.1 mg total) by mouth 3 (three) times  daily.   escitalopram 20 MG tablet Commonly known as: LEXAPRO Take 1 tablet (20 mg total) by mouth daily. Start taking on:  June 12, 2021 What changed:  medication strength how much to take   gabapentin 400 MG capsule Commonly known as: NEURONTIN Take 1 capsule (400 mg total) by mouth 3 (three) times daily.   hydrOXYzine 25 MG tablet Commonly known as: ATARAX Take 1 tablet (25 mg total) by mouth 3 (three) times daily as needed for anxiety.   metoprolol tartrate 50 MG tablet Commonly known as: LOPRESSOR Take 1 tablet (50 mg total) by mouth 2 (two) times daily.   multivitamin with minerals Tabs tablet Take 1 tablet by mouth daily. Start taking on: June 12, 2021   QUEtiapine 50 MG tablet Commonly known as: SEROQUEL Take 1 tablet (50 mg total) by mouth at bedtime.   thiamine 100 MG tablet Take 1 tablet (100 mg total) by mouth daily. Start taking on: June 12, 2021   traZODone 100 MG tablet Commonly known as: DESYREL Take 1 tablet (100 mg total) by mouth at bedtime as needed for sleep.        7-day samples of above medications provided discharge.  30-day prescription with no refills also provided at time of discharge. Disposition:  Home with father  Ival Bible, MD 06/11/2021, 11:05 AM

## 2021-06-11 NOTE — ED Notes (Signed)
Pt in room.  Breathing is unlabored and even.  No pain or discomfort noted/ voiced. Will continue to monitor for safety.

## 2021-06-11 NOTE — ED Notes (Signed)
Pt aware he is discharging and has placed call to father to pick him up.   AVS reviewed prior to discharge.  Pt alert, oriented, and ambulatory.  Safety maintained.

## 2021-06-11 NOTE — ED Notes (Signed)
Pt asleep in bed. Respirations even and unlabored. Will continue to monitor for safety. ?

## 2021-06-11 NOTE — ED Provider Notes (Signed)
Upon discharge, it was noted that printed scripts did not leave with patient on discharge. Multiple attempts were made to call patient at phone numbers listed in face sheet; numbers were not working or VM was full and unable to leave message. Attempted to call father who is listed as contact; however, call was not answered and VM was full

## 2021-06-11 NOTE — Discharge Instructions (Signed)
Take all medications as prescribed by his/her mental healthcare provider. °Report any adverse effects and or reactions from the medicines to your outpatient provider promptly. °Do not engage in alcohol and or illegal drug use while on prescription medicines. °In the event of worsening symptoms, call the crisis hotline, 911 and or go to the nearest ED for appropriate evaluation and treatment of symptoms. °follow-up with your primary care provider for your other medical issues, concerns and or health care needs. ° ° ° ° °Please come to [facility] (this facility) during walk in hours for appointment with psychiatrist for further medication management and for therapists for therapy.  ° ° Walk in hours are 8-11 AM Monday through Thursday for medication management. Therapy walk in hours are Monday-Wednesday 8 AM-1PM.   It is first come, first -serve; it is best to arrive by 7:00 AM.  ° °On Friday from 1 pm to 4 pm for therapy intake only. Please arrive by 12:00 pm as it is  first come, first -serve.   ° °When you arrive please go upstairs for your appointment. If you are unsure of where to go, inform the front desk that you are here for a walk in appointment and they will assist you with directions upstairs. ° °Address:  °[address], in [place], [address] °Ph: [phone]  ° °

## 2021-06-11 NOTE — Clinical Social Work Psych Note (Signed)
CSW Discharge Note  Gaius reports he feels "Okay" this morning. He reports receiving poor sleep, stating that he had trouble falling asleep last night. Johanthan did share that he requested medications for sleep, however there were not as effective as he hoped.   Elma reports his mood is better and he believes he is ready to discharge today. Jary reports he is returning home with his father. He also shared that his father will be picking him up at discharge.   CSW shared that outpatient follow up resources were charted in the patient's AVS. Romy expressed understanding.   Hoy did not have any additional questions or concerns at this time.    CSW will continue to follow until discharge.      Baldo Daub, MSW, LCSW Clinical Child psychotherapist (Facility Based Crisis) [facility]

## 2021-06-16 ENCOUNTER — Other Ambulatory Visit: Payer: Self-pay

## 2021-06-16 ENCOUNTER — Other Ambulatory Visit: Payer: Self-pay | Admitting: Family Medicine

## 2021-06-16 ENCOUNTER — Telehealth: Payer: Self-pay | Admitting: Family Medicine

## 2021-06-16 DIAGNOSIS — I10 Essential (primary) hypertension: Secondary | ICD-10-CM

## 2021-06-16 DIAGNOSIS — G4700 Insomnia, unspecified: Secondary | ICD-10-CM

## 2021-06-16 DIAGNOSIS — F419 Anxiety disorder, unspecified: Secondary | ICD-10-CM

## 2021-06-16 DIAGNOSIS — F1920 Other psychoactive substance dependence, uncomplicated: Secondary | ICD-10-CM

## 2021-06-16 NOTE — Telephone Encounter (Signed)
PT asking for med refills for [facility] and [facility].   Gabapentin 400 MG TAKE 1 CAPSULE (400 MG TOTAL) BY MOUTH 3 (THREE) TIMES DAILY.  Metoprolol Tartrate 50 MG TAKE 1 TABLET (50 MG TOTAL) BY MOUTH 2 (TWO) TIMES DAILY.  traZODone HCl 100 mg Oral Daily at bedtime  cloNIDine HCl 0.1 MG TAKE 1 TABLET (0.1 MG TOTAL) BY MOUTH 3 (THREE) TIMES DAILY.  Escitalopram Oxalate 10 MG TAKE 2 TABLETS (20 MG TOTAL) BY MOUTH DAILY.  And scheduled virtual appt w/ PCP due to lack of transportation.

## 2021-06-19 ENCOUNTER — Telehealth (HOSPITAL_COMMUNITY): Payer: Self-pay | Admitting: Family Medicine

## 2021-06-19 NOTE — BH Assessment (Signed)
Care Management - [facility] Follow Up Discharges   Writer attempted to make contact with patient today and was unsuccessful.  Voicemail is full.  Per chart review, patient was provided with outpatient resources.

## 2021-06-22 ENCOUNTER — Other Ambulatory Visit: Payer: Self-pay | Admitting: Family Medicine

## 2021-06-22 ENCOUNTER — Other Ambulatory Visit: Payer: Self-pay

## 2021-06-22 DIAGNOSIS — G4700 Insomnia, unspecified: Secondary | ICD-10-CM

## 2021-06-22 DIAGNOSIS — F419 Anxiety disorder, unspecified: Secondary | ICD-10-CM

## 2021-06-23 ENCOUNTER — Other Ambulatory Visit: Payer: Self-pay | Admitting: Family Medicine

## 2021-06-23 ENCOUNTER — Other Ambulatory Visit: Payer: Self-pay

## 2021-06-23 DIAGNOSIS — G4700 Insomnia, unspecified: Secondary | ICD-10-CM

## 2021-06-23 DIAGNOSIS — F419 Anxiety disorder, unspecified: Secondary | ICD-10-CM

## 2021-06-29 ENCOUNTER — Telehealth: Payer: Self-pay | Admitting: Family Medicine

## 2021-06-29 ENCOUNTER — Encounter: Payer: Self-pay | Admitting: Nurse Practitioner

## 2021-06-29 ENCOUNTER — Other Ambulatory Visit: Payer: Self-pay

## 2021-06-29 ENCOUNTER — Ambulatory Visit (INDEPENDENT_AMBULATORY_CARE_PROVIDER_SITE_OTHER): Payer: Self-pay | Admitting: Nurse Practitioner

## 2021-06-29 DIAGNOSIS — G4700 Insomnia, unspecified: Secondary | ICD-10-CM

## 2021-06-29 MED ORDER — METOPROLOL TARTRATE 50 MG PO TABS
50.0000 mg | ORAL_TABLET | Freq: Two times a day (BID) | ORAL | 0 refills | Status: DC
  Filled 2021-06-29 – 2021-07-13 (×2): qty 60, 30d supply, fill #0

## 2021-06-29 MED ORDER — CLONIDINE HCL 0.1 MG PO TABS
0.1000 mg | ORAL_TABLET | Freq: Three times a day (TID) | ORAL | 0 refills | Status: DC
  Filled 2021-06-29 – 2021-07-13 (×2): qty 90, 30d supply, fill #0

## 2021-06-29 MED ORDER — GABAPENTIN 400 MG PO CAPS
400.0000 mg | ORAL_CAPSULE | Freq: Three times a day (TID) | ORAL | 0 refills | Status: DC
  Filled 2021-06-29 – 2021-07-13 (×2): qty 90, 30d supply, fill #0

## 2021-06-29 MED ORDER — TRAZODONE HCL 100 MG PO TABS
100.0000 mg | ORAL_TABLET | Freq: Every evening | ORAL | 0 refills | Status: DC | PRN
  Filled 2021-06-29 – 2021-07-13 (×2): qty 30, 30d supply, fill #0

## 2021-06-29 MED ORDER — ESCITALOPRAM OXALATE 20 MG PO TABS
20.0000 mg | ORAL_TABLET | Freq: Every day | ORAL | 0 refills | Status: DC
  Filled 2021-06-29: qty 30, 30d supply, fill #0

## 2021-06-29 MED ORDER — QUETIAPINE FUMARATE 50 MG PO TABS
50.0000 mg | ORAL_TABLET | Freq: Every day | ORAL | 0 refills | Status: DC
  Filled 2021-06-29 – 2021-07-13 (×2): qty 30, 30d supply, fill #0

## 2021-06-29 MED ORDER — HYDROXYZINE HCL 25 MG PO TABS
25.0000 mg | ORAL_TABLET | Freq: Three times a day (TID) | ORAL | 0 refills | Status: DC | PRN
  Filled 2021-06-29: qty 30, 10d supply, fill #0

## 2021-06-29 NOTE — Progress Notes (Signed)
Virtual Visit via Telephone Note  I connected with Eric Keller on 06/29/21 at 10:40 AM EST by telephone and verified that I am speaking with the correct person using two identifiers.  Location: Patient: home Provider: office   I discussed the limitations, risks, security and privacy concerns of performing an evaluation and management service by telephone and the availability of in person appointments. I also discussed with the patient that there may be a patient responsible charge related to this service. The patient expressed understanding and agreed to proceed.   History of Present Illness:  Patient presents today for medication refills through telephone visit.  Patient was recently seen in the ED on 06/08/2021 for depression and anxiety.  He was discharged home with his current medication list with 1 month supply of medication.  He states that his blood pressures have been running normal at home around 130/80.  He states that he did have a referral placed to psychiatry at hospital discharge and is awaiting this appointment.  He did miss his recent an office appointment with his PCP with Dr. Andrey Campanile here at this office.  We discussed that we can give him a 1 month refill on his medications but he will need to be seen in person by Dr. Andrey Campanile for any further refills.  It appears that it will be March before he can get in for his psychiatry appointment so we can get him set up with counseling in the meantime here at this office.  Patient states that he has been doing well since hospital discharge and has no acute new issues or concerns today. Denies f/c/s, n/v/d, hemoptysis, PND, chest pain or edema.     Observations/Objective:  Vitals with BMI 02/03/2021 01/05/2021 11/04/2020  Height 6\' 2"  - -  Weight 208 lbs - -  BMI 26.69 - -  Systolic 137 131  Diastolic 92 84 91  Pulse 66 60 60  Some encounter information is confidential and restricted. Go to Review Flowsheets activity to see all data.       Assessment and Plan:  Anxiety and depression Hypertension:  Continue current medications 1 month refill will be given - must follow up with PCP for further refills  Stay active  Heart healthy diet  Follow up:  Follow up within 1 month with PCP     I discussed the assessment and treatment plan with the patient. The patient was provided an opportunity to ask questions and all were answered. The patient agreed with the plan and demonstrated an understanding of the instructions.   The patient was advised to call back or seek an in-person evaluation if the symptoms worsen or if the condition fails to improve as anticipated.  I provided 23 minutes of non-face-to-face time during this encounter.   235, NP

## 2021-06-29 NOTE — Patient Instructions (Signed)
Anxiety and depression Hypertension:  Continue current medications 1 month refill will be given - must follow up with PCP for further refills  Stay active  Heart healthy diet  Follow up:  Follow up within 1 month with PCP

## 2021-07-01 ENCOUNTER — Other Ambulatory Visit: Payer: Self-pay

## 2021-07-02 ENCOUNTER — Other Ambulatory Visit: Payer: Self-pay

## 2021-07-03 ENCOUNTER — Other Ambulatory Visit: Payer: Self-pay

## 2021-07-07 ENCOUNTER — Institutional Professional Consult (permissible substitution): Payer: Self-pay | Admitting: Clinical

## 2021-07-13 ENCOUNTER — Other Ambulatory Visit: Payer: Self-pay

## 2021-07-23 ENCOUNTER — Encounter: Payer: Self-pay | Admitting: Family Medicine

## 2021-07-23 ENCOUNTER — Ambulatory Visit (INDEPENDENT_AMBULATORY_CARE_PROVIDER_SITE_OTHER): Payer: Self-pay | Admitting: Family Medicine

## 2021-07-23 ENCOUNTER — Other Ambulatory Visit: Payer: Self-pay

## 2021-07-23 VITALS — BP 147/97 | HR 62 | Temp 98.0°F | Resp 16 | Wt 225.2 lb

## 2021-07-23 DIAGNOSIS — I10 Essential (primary) hypertension: Secondary | ICD-10-CM

## 2021-07-23 DIAGNOSIS — G4700 Insomnia, unspecified: Secondary | ICD-10-CM

## 2021-07-23 DIAGNOSIS — F419 Anxiety disorder, unspecified: Secondary | ICD-10-CM

## 2021-07-23 MED ORDER — HYDROXYZINE HCL 25 MG PO TABS
25.0000 mg | ORAL_TABLET | Freq: Three times a day (TID) | ORAL | 1 refills | Status: AC | PRN
  Filled 2021-07-23 – 2022-07-19 (×2): qty 60, 20d supply, fill #0

## 2021-07-23 MED ORDER — PROPRANOLOL HCL 20 MG PO TABS
20.0000 mg | ORAL_TABLET | Freq: Three times a day (TID) | ORAL | 0 refills | Status: DC
  Filled 2021-07-23 – 2021-07-31 (×2): qty 90, 30d supply, fill #0
  Filled 2021-09-01: qty 90, 30d supply, fill #1
  Filled 2021-09-30: qty 90, 30d supply, fill #2

## 2021-07-23 MED ORDER — ESCITALOPRAM OXALATE 20 MG PO TABS
20.0000 mg | ORAL_TABLET | Freq: Every day | ORAL | 0 refills | Status: DC
  Filled 2021-07-23 – 2021-09-01 (×2): qty 30, 30d supply, fill #0
  Filled 2021-09-30: qty 30, 30d supply, fill #1
  Filled 2021-12-28: qty 30, 30d supply, fill #2

## 2021-07-23 MED ORDER — QUETIAPINE FUMARATE 100 MG PO TABS
100.0000 mg | ORAL_TABLET | Freq: Every day | ORAL | 0 refills | Status: DC
  Filled 2021-07-23 – 2021-08-10 (×2): qty 30, 30d supply, fill #0
  Filled 2021-09-03: qty 30, 30d supply, fill #1
  Filled 2021-09-30: qty 30, 30d supply, fill #2

## 2021-07-23 NOTE — Progress Notes (Signed)
New Patient Office Visit  Subjective:  Patient ID: Eric Keller, male    DOB: 02/26/1986  Age: 36 y.o. MRN: [id number]  CC:  Chief Complaint  Patient presents with   Follow-up   Hypertension   Depression    HPI [name] presents for follow up of chronic med issues including hypertension and anxiety.   Past Medical History:  Diagnosis Date   Alcohol withdrawal (HCC) 07/20/2019   Anxiety    Anxiety    Depression    H/O: substance abuse (HCC)    Hypertension     Past Surgical History:  Procedure Laterality Date   WISDOM TOOTH EXTRACTION      Family History  Problem Relation Age of Onset   Cancer Mother    Hypertension Paternal Grandfather    Heart disease Paternal Grandfather        MIs   Heart disease Other    Stroke Other     Social History   Socioeconomic History   Marital status: Single    Spouse name: Not on file   Number of children: Not on file   Years of education: Not on file   Highest education level: Not on file  Occupational History   Not on file  Tobacco Use   Smoking status: Never    Passive exposure: Past   Smokeless tobacco: Never  Vaping Use   Vaping Use: Never used  Substance and Sexual Activity   Alcohol use: Yes    Comment: daily 5th of liquor   Drug use: Not Currently    Types: Marijuana    Comment: Vicodin Abuse, BEnzos   Sexual activity: Not Currently  Other Topics Concern   Not on file  Social History Narrative   Not on file   Social Determinants of Health   Financial Resource Strain: Not on file  Food Insecurity: Not on file  Transportation Needs: Not on file  Physical Activity: Not on file  Stress: Not on file  Social Connections: Not on file  Intimate Partner Violence: Not on file    ROS Review of Systems  Psychiatric/Behavioral:  Positive for sleep disturbance. Negative for self-injury and suicidal ideas. The patient is nervous/anxious.   All other systems reviewed and are negative.  Objective:    Today's Vitals: BP (!) 147/97    Pulse 62    Temp 98 F (36.7 C) (Oral)    Resp 16    Wt 225 lb 3.2 oz (102.2 kg)    SpO2 95%    BMI 28.91 kg/m   Physical Exam Vitals and nursing note reviewed.  Constitutional:      General: He is not in acute distress. Cardiovascular:     Rate and Rhythm: Normal rate and regular rhythm.  Pulmonary:     Effort: Pulmonary effort is normal.     Breath sounds: Normal breath sounds.  Abdominal:     Palpations: Abdomen is soft.     Tenderness: There is no abdominal tenderness.  Musculoskeletal:     Right lower leg: No edema.  Neurological:     General: No focal deficit present.     Mental Status: He is alert and oriented to person, place, and time.    Assessment & Plan:   1. Essential hypertension, benign Will d/c metoprolol and change to propranolol 20 mg tid and monitor  2. Insomnia, unspecified type Will d/c trazodone and increase seroquel to 100 mg qhs.  3. Anxiety Continue lexapro and hydroxyzine. Seroquel and propranolol as  above    Outpatient Encounter Medications as of 07/23/2021  Medication Sig   cloNIDine (CATAPRES) 0.1 MG tablet Take 1 tablet (0.1 mg total) by mouth 3 (three) times daily.   gabapentin (NEURONTIN) 400 MG capsule Take 1 capsule (400 mg total) by mouth 3 (three) times daily.   Multiple Vitamin (MULTIVITAMIN WITH MINERALS) TABS tablet Take 1 tablet by mouth daily.   propranolol (INDERAL) 20 MG tablet Take 1 tablet (20 mg total) by mouth 3 (three) times daily.   QUEtiapine (SEROQUEL) 100 MG tablet Take 1 tablet (100 mg total) by mouth at bedtime.   thiamine 100 MG tablet Take 1 tablet (100 mg total) by mouth daily.   [DISCONTINUED] escitalopram (LEXAPRO) 20 MG tablet Take 1 tablet (20 mg total) by mouth daily.   [DISCONTINUED] hydrOXYzine (ATARAX) 25 MG tablet Take 1 tablet (25 mg total) by mouth 3 (three) times daily as needed for anxiety.   [DISCONTINUED] metoprolol tartrate (LOPRESSOR) 50 MG tablet Take 1 tablet  (50 mg total) by mouth 2 (two) times daily.   [DISCONTINUED] QUEtiapine (SEROQUEL) 50 MG tablet Take 1 tablet (50 mg total) by mouth at bedtime.   [DISCONTINUED] traZODone (DESYREL) 100 MG tablet Take 1 tablet (100 mg total) by mouth at bedtime as needed for sleep.   escitalopram (LEXAPRO) 20 MG tablet Take 1 tablet (20 mg total) by mouth daily.   hydrOXYzine (ATARAX) 25 MG tablet Take 1 tablet (25 mg total) by mouth 3 (three) times daily as needed for anxiety.   No facility-administered encounter medications on file as of 07/23/2021.    Follow-up: No follow-ups on file.   [name], MD

## 2021-07-23 NOTE — Progress Notes (Signed)
Patient would like a medication change.   Patient said his anxiety sometimes gets out of hand and it gets this best of him

## 2021-07-30 ENCOUNTER — Other Ambulatory Visit: Payer: Self-pay

## 2021-07-30 ENCOUNTER — Telehealth: Payer: Self-pay | Admitting: Family Medicine

## 2021-07-30 NOTE — Telephone Encounter (Signed)
°  Rx #: [id number]  cloNIDine (CATAPRES) 0.1 MG tablet [[id number]]   Rx #: [id number]  gabapentin (NEURONTIN) 400 MG capsule [[id number]]    Pharmacy  [facility]  [address], [address], [place] [address]  Phone:  [phone]  Fax:  [phone]  DEA #:  [id number]

## 2021-07-31 ENCOUNTER — Other Ambulatory Visit: Payer: Self-pay

## 2021-07-31 ENCOUNTER — Other Ambulatory Visit: Payer: Self-pay | Admitting: *Deleted

## 2021-07-31 MED ORDER — CLONIDINE HCL 0.1 MG PO TABS
0.1000 mg | ORAL_TABLET | Freq: Three times a day (TID) | ORAL | 0 refills | Status: DC
  Filled 2021-07-31 – 2021-08-10 (×2): qty 90, 30d supply, fill #0

## 2021-07-31 MED ORDER — GABAPENTIN 400 MG PO CAPS
400.0000 mg | ORAL_CAPSULE | Freq: Three times a day (TID) | ORAL | 0 refills | Status: DC
  Filled 2021-07-31 – 2021-08-10 (×2): qty 90, 30d supply, fill #0

## 2021-08-04 ENCOUNTER — Encounter: Payer: Self-pay | Admitting: Emergency Medicine

## 2021-08-04 ENCOUNTER — Ambulatory Visit (INDEPENDENT_AMBULATORY_CARE_PROVIDER_SITE_OTHER): Payer: Self-pay

## 2021-08-04 ENCOUNTER — Other Ambulatory Visit: Payer: Self-pay

## 2021-08-04 ENCOUNTER — Ambulatory Visit
Admission: EM | Admit: 2021-08-04 | Discharge: 2021-08-04 | Disposition: A | Discharge status: Home / Self Care | Payer: Self-pay | Attending: Physician Assistant | Admitting: Physician Assistant

## 2021-08-04 DIAGNOSIS — L03011 Cellulitis of right finger: Secondary | ICD-10-CM

## 2021-08-04 DIAGNOSIS — S61011A Laceration without foreign body of right thumb without damage to nail, initial encounter: Secondary | ICD-10-CM

## 2021-08-04 MED ORDER — DOXYCYCLINE HYCLATE 100 MG PO CAPS
100.0000 mg | ORAL_CAPSULE | Freq: Two times a day (BID) | ORAL | 0 refills | Status: DC
  Filled 2021-08-04: qty 20, 10d supply, fill #0

## 2021-08-04 MED ORDER — KETOROLAC TROMETHAMINE 30 MG/ML IJ SOLN
30.0000 mg | Freq: Once | INTRAMUSCULAR | Status: AC
  Administered 2021-08-04: 30 mg via INTRAMUSCULAR

## 2021-08-04 NOTE — ED Provider Notes (Signed)
[facility]    CSN: [id number] Arrival date & time: 08/04/21  1210      History   Chief Complaint Chief Complaint  Patient presents with   right thumb swelling    HPI Eric Keller is a 36 y.o. male.   Patient here today for evaluation of right thumb swelling and pain that started about a week ago.  He states that he has had some bruising beneath his thumbnail and is not sure if he had gotten glass from his fall in his thumb.  He denies any known injury otherwise.  He has tried taking ibuprofen without significant relief.  The history is provided by the patient.   Past Medical History:  Diagnosis Date   Alcohol withdrawal (HCC) 07/20/2019   Anxiety    Anxiety    Depression    H/O: substance abuse (HCC)    Hypertension     Patient Active Problem List   Diagnosis Date Noted   GAD (generalized anxiety disorder) 06/11/2021   Insomnia 06/11/2021   MDD (major depressive disorder), recurrent episode, severe (HCC) 06/08/2021   Suicidal ideation 10/31/2020   Nausea and vomiting 10/31/2020   Alcohol withdrawal (HCC) 10/30/2020   Major depressive disorder, recurrent episode, moderate (HCC) 08/08/2020   Polysubstance dependence (HCC) 10/03/2019   MDD (major depressive disorder), recurrent severe, without psychosis (HCC) 10/02/2019   Polysubstance dependence including opioid type drug with complication, continuous use (HCC) 07/22/2019   Benzodiazepine withdrawal without complication (HCC) 07/22/2019   Alcohol dependence with uncomplicated withdrawal (HCC) 07/19/2019   LFT elevation    Essential hypertension, benign 05/11/2018   Flushing 05/11/2018   Nonintractable headache 05/11/2018   Erythrocytosis 05/11/2018   Anxiety 04/14/2011   Depression 04/14/2011    Past Surgical History:  Procedure Laterality Date   WISDOM TOOTH EXTRACTION         Home Medications    Prior to Admission medications   Medication Sig Start Date End Date Taking? Authorizing  Provider  cloNIDine (CATAPRES) 0.1 MG tablet Take 1 tablet (0.1 mg total) by mouth 3 (three) times daily. 07/31/21  Yes Georganna Skeans, MD  doxycycline (VIBRAMYCIN) 100 MG capsule Take 1 capsule (100 mg total) by mouth 2 (two) times daily. 08/04/21  Yes Tomi Bamberger, PA-C  escitalopram (LEXAPRO) 20 MG tablet Take 1 tablet (20 mg total) by mouth daily. 07/23/21  Yes Georganna Skeans, MD  gabapentin (NEURONTIN) 400 MG capsule Take 1 capsule (400 mg total) by mouth 3 (three) times daily. 07/31/21  Yes Georganna Skeans, MD  hydrOXYzine (ATARAX) 25 MG tablet Take 1 tablet (25 mg total) by mouth 3 (three) times daily as needed for anxiety. 07/23/21  Yes Georganna Skeans, MD  Multiple Vitamin (MULTIVITAMIN WITH MINERALS) TABS tablet Take 1 tablet by mouth daily. 06/12/21  Yes Estella Husk, MD  propranolol (INDERAL) 20 MG tablet Take 1 tablet (20 mg total) by mouth 3 (three) times daily. 07/23/21  Yes Georganna Skeans, MD  QUEtiapine (SEROQUEL) 100 MG tablet Take 1 tablet (100 mg total) by mouth at bedtime. 07/23/21  Yes Georganna Skeans, MD  thiamine 100 MG tablet Take 1 tablet (100 mg total) by mouth daily. 06/12/21  Yes Estella Husk, MD    Family History Family History  Problem Relation Age of Onset   Cancer Mother    Hypertension Paternal Grandfather    Heart disease Paternal Grandfather        MIs   Heart disease Other    Stroke Other  Social History Social History   Tobacco Use   Smoking status: Never    Passive exposure: Past   Smokeless tobacco: Never  Vaping Use   Vaping Use: Never used  Substance Use Topics   Alcohol use: Yes    Comment: daily 5th of liquor   Drug use: Not Currently    Types: Marijuana    Comment: Vicodin Abuse, BEnzos     Allergies   Patient has no known allergies.   Review of Systems Review of Systems  Constitutional:  Negative for chills and fever.  Eyes:  Negative for discharge and redness.  Skin:  Positive for color change. Negative for  wound.  Neurological:  Negative for numbness.    Physical Exam Triage Vital Signs ED Triage Vitals  Enc Vitals Group     BP 08/04/21 1258 (!) 179/107     Pulse Rate 08/04/21 1258 (!) 53     Resp --      Temp 08/04/21 1258 97.9 F (36.6 C)     Temp Source 08/04/21 1258 Oral     SpO2 08/04/21 1258 98 %     Weight 08/04/21 1259 230 lb (104.3 kg)     Height 08/04/21 1259 6\' 2"  (1.88 m)     Head Circumference --      Peak Flow --      Pain Score 08/04/21 1259 9     Pain Loc --      Pain Edu? --      Excl. in [place]? --    No data found.  Updated Vital Signs BP (!) 179/107 (BP Location: Left Arm)    Pulse (!) 53    Temp 97.9 F (36.6 C) (Oral)    Ht 6\' 2"  (1.88 m)    Wt 230 lb (104.3 kg)    SpO2 98%    BMI 29.53 kg/m      Physical Exam Vitals and nursing note reviewed.  Constitutional:      General: He is not in acute distress.    Appearance: Normal appearance. He is not ill-appearing.  HENT:     Head: Normocephalic and atraumatic.  Eyes:     Conjunctiva/sclera: Conjunctivae normal.  Cardiovascular:     Rate and Rhythm: Normal rate.  Pulmonary:     Effort: Pulmonary effort is normal.  Musculoskeletal:     Comments: Decreased ROM of right thumb due to pain, diffuse swelling noted, mild erythema present diffusely, TTP to entirety of thumb- worse distally  Skin:    Capillary Refill: Normal cap refill    Comments: Hematoma noted to right distal thumb under nail  Neurological:     Mental Status: He is alert.  Psychiatric:        Mood and Affect: Mood normal.        Behavior: Behavior normal.        Thought Content: Thought content normal.     UC Treatments / Results  Labs (all labs ordered are listed, but only abnormal results are displayed) Labs Reviewed - No data to display  EKG   Radiology DG Finger Thumb Right  Result Date: 08/04/2021 CLINICAL DATA:  Possible foreign body, glass EXAM: RIGHT THUMB 2+V COMPARISON:  None. FINDINGS: There is no evidence of  fracture or dislocation. There is no evidence of arthropathy or other focal bone abnormality. Soft tissues are unremarkable. Negative for foreign body. IMPRESSION: Negative. Electronically Signed   By: Franchot Gallo M.D.   On: 08/04/2021 13:19    Procedures  Procedures (including critical care time)  Medications Ordered in UC Medications  ketorolac (TORADOL) 30 MG/ML injection 30 mg (30 mg Intramuscular Given 08/04/21 1335)    Initial Impression / Assessment and Plan / UC Course  I have reviewed the triage vital signs and the nursing notes.  Pertinent labs & imaging results that were available during my care of the patient were reviewed by me and considered in my medical decision making (see chart for details).    Xray without FB or other concerning findings. Will treat to cover infectious source with doxycycline. Encouraged follow up with any further concerns and recommended he report to ED with any worsening symptoms.   Final Clinical Impressions(s) / UC Diagnoses   Final diagnoses:  Cellulitis of finger of right hand   Discharge Instructions   None    ED Prescriptions     Medication Sig Dispense Auth. Provider   doxycycline (VIBRAMYCIN) 100 MG capsule Take 1 capsule (100 mg total) by mouth 2 (two) times daily. 20 capsule Francene Finders, PA-C      PDMP not reviewed this encounter.   Francene Finders, PA-C 08/04/21 1422

## 2021-08-04 NOTE — ED Triage Notes (Signed)
Patient states that he believes he has some glass in his right thumb.  He is having right thumb swelling, color changes x 1 week.  The area is very painful.  Patient has been taken Ibuprofen for the pain.

## 2021-08-10 ENCOUNTER — Other Ambulatory Visit: Payer: Self-pay

## 2021-09-01 ENCOUNTER — Other Ambulatory Visit: Payer: Self-pay | Admitting: Family Medicine

## 2021-09-01 ENCOUNTER — Other Ambulatory Visit: Payer: Self-pay

## 2021-09-01 MED ORDER — CLONIDINE HCL 0.1 MG PO TABS
0.1000 mg | ORAL_TABLET | Freq: Three times a day (TID) | ORAL | 0 refills | Status: DC
  Filled 2021-09-01: qty 90, 30d supply, fill #0

## 2021-09-01 MED ORDER — GABAPENTIN 400 MG PO CAPS
400.0000 mg | ORAL_CAPSULE | Freq: Three times a day (TID) | ORAL | 0 refills | Status: DC
  Filled 2021-09-01: qty 90, 30d supply, fill #0

## 2021-09-02 ENCOUNTER — Emergency Department (HOSPITAL_COMMUNITY)
Admission: EM | Admit: 2021-09-02 | Discharge: 2021-09-03 | Disposition: A | Discharge status: Home / Self Care | Payer: No Typology Code available for payment source | Attending: Emergency Medicine | Admitting: Emergency Medicine

## 2021-09-02 ENCOUNTER — Other Ambulatory Visit: Payer: Self-pay

## 2021-09-02 ENCOUNTER — Emergency Department (HOSPITAL_COMMUNITY): Payer: No Typology Code available for payment source

## 2021-09-02 ENCOUNTER — Encounter (HOSPITAL_COMMUNITY): Payer: Self-pay

## 2021-09-02 DIAGNOSIS — R002 Palpitations: Secondary | ICD-10-CM | POA: Insufficient documentation

## 2021-09-02 DIAGNOSIS — R519 Headache, unspecified: Secondary | ICD-10-CM | POA: Diagnosis not present

## 2021-09-02 DIAGNOSIS — E860 Dehydration: Secondary | ICD-10-CM | POA: Diagnosis not present

## 2021-09-02 DIAGNOSIS — R55 Syncope and collapse: Secondary | ICD-10-CM | POA: Diagnosis not present

## 2021-09-02 DIAGNOSIS — R110 Nausea: Secondary | ICD-10-CM | POA: Diagnosis not present

## 2021-09-02 DIAGNOSIS — Y92410 Unspecified street and highway as the place of occurrence of the external cause: Secondary | ICD-10-CM | POA: Diagnosis not present

## 2021-09-02 DIAGNOSIS — Z79899 Other long term (current) drug therapy: Secondary | ICD-10-CM | POA: Insufficient documentation

## 2021-09-02 DIAGNOSIS — I10 Essential (primary) hypertension: Secondary | ICD-10-CM | POA: Diagnosis not present

## 2021-09-02 LAB — CBC WITH DIFFERENTIAL/PLATELET
Abs Immature Granulocytes: 0.02 10*3/uL (ref 0.00–0.07)
Basophils Absolute: 0.1 10*3/uL (ref 0.0–0.1)
Basophils Relative: 1 %
Eosinophils Absolute: 0.5 10*3/uL (ref 0.0–0.5)
Eosinophils Relative: 7 %
HCT: 49.3 % (ref 39.0–52.0)
Hemoglobin: 16.9 g/dL (ref 13.0–17.0)
Immature Granulocytes: 0 %
Lymphocytes Relative: 30 %
Lymphs Abs: 2.4 10*3/uL (ref 0.7–4.0)
MCH: 32.3 pg (ref 26.0–34.0)
MCHC: 34.3 g/dL (ref 30.0–36.0)
MCV: 94.3 fL (ref 80.0–100.0)
Monocytes Absolute: 0.5 10*3/uL (ref 0.1–1.0)
Monocytes Relative: 6 %
Neutro Abs: 4.4 10*3/uL (ref 1.7–7.7)
Neutrophils Relative %: 56 %
Platelets: 229 10*3/uL (ref 150–400)
RBC: 5.23 MIL/uL (ref 4.22–5.81)
RDW: 14.1 % (ref 11.5–15.5)
WBC: 7.9 10*3/uL (ref 4.0–10.5)
nRBC: 0 % (ref 0.0–0.2)

## 2021-09-02 LAB — BASIC METABOLIC PANEL
Anion gap: 7 (ref 5–15)
BUN: 9 mg/dL (ref 6–20)
CO2: 27 mmol/L (ref 22–32)
Calcium: 9.2 mg/dL (ref 8.9–10.3)
Chloride: 103 mmol/L (ref 98–111)
Creatinine, Ser: 1.41 mg/dL — ABNORMAL HIGH (ref 0.61–1.24)
GFR, Estimated: >60 mL/min (ref >60)
Glucose, Bld: 106 mg/dL — ABNORMAL HIGH (ref 70–99)
Potassium: 4.1 mmol/L (ref 3.5–5.1)
Sodium: 137 mmol/L (ref 135–145)

## 2021-09-02 LAB — RAPID URINE DRUG SCREEN, HOSP PERFORMED
Amphetamines: NOT DETECTED
Barbiturates: NOT DETECTED
Benzodiazepines: NOT DETECTED
Cocaine: NOT DETECTED
Opiates: NOT DETECTED
Tetrahydrocannabinol: POSITIVE — AB

## 2021-09-02 LAB — TROPONIN I (HIGH SENSITIVITY): Troponin I (High Sensitivity): 8 ng/L (ref <18)

## 2021-09-02 LAB — MAGNESIUM: Magnesium: 2 mg/dL (ref 1.7–2.4)

## 2021-09-02 MED ORDER — LACTATED RINGERS IV BOLUS
1000.0000 mL | Freq: Once | INTRAVENOUS | Status: AC
  Administered 2021-09-02: 1000 mL via INTRAVENOUS

## 2021-09-02 MED ORDER — ACETAMINOPHEN 500 MG PO TABS
1000.0000 mg | ORAL_TABLET | Freq: Once | ORAL | Status: AC
  Administered 2021-09-02: 1000 mg via ORAL
  Filled 2021-09-02: qty 2

## 2021-09-02 MED ORDER — ONDANSETRON 4 MG PO TBDP
4.0000 mg | ORAL_TABLET | Freq: Once | ORAL | Status: AC
  Administered 2021-09-02: 4 mg via ORAL
  Filled 2021-09-02: qty 1

## 2021-09-02 MED ORDER — LORAZEPAM 1 MG PO TABS
0.5000 mg | ORAL_TABLET | Freq: Once | ORAL | Status: AC
  Administered 2021-09-02: 0.5 mg via SUBLINGUAL
  Filled 2021-09-02: qty 1

## 2021-09-02 NOTE — ED Notes (Signed)
Warm blanket given

## 2021-09-02 NOTE — ED Notes (Signed)
Pt angry threatening to leave edp will see ?

## 2021-09-02 NOTE — ED Notes (Signed)
Most of the pts pain iiiis in the lt side of his chest ?

## 2021-09-02 NOTE — ED Notes (Signed)
Lying: 134/94 ?               67 ?Sitting: 147/96 ?               69 ?Standing: 138/101 ?                   72 ?

## 2021-09-02 NOTE — ED Notes (Signed)
The pt has pulled off all his leads and wires off and reports that he is leaving in 20 minutes  he also reports that he has not been seen for 4 hours   and he tired of waiting for a doctor ?

## 2021-09-02 NOTE — ED Notes (Signed)
Lab drawn 3 hours ago just resulted  after I called about ir ?
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
## 2021-09-02 NOTE — ED Notes (Signed)
Pt ready to go did not want to waitin on vital signs ?

## 2021-09-02 NOTE — Discharge Instructions (Addendum)
You were evaluated in the Emergency Department and after careful evaluation, we did not find any emergent condition requiring admission or further testing in the hospital. ? ?Your exam/testing today was overall reassuring.  There was noted mild dehydration.  You had no signs of acute traumatic injury on exam.  Your episode of syncope could have been due to dehydration.  We recommend that you schedule an appointment with Dr. Excell Seltzer of cardiology to follow-up in clinic for consideration for cardiac monitor placement to try and capture any abnormal cardiac rhythm. ? ?Please return to the Emergency Department if you experience any worsening of your condition.  Thank you for allowing [place] to be a part of your care. ? ?

## 2021-09-02 NOTE — ED Notes (Signed)
Pts iv removed per pts request ?

## 2021-09-02 NOTE — ED Notes (Signed)
Labd studies not resulted  I called the lab  labs have been pending for 3 hours  lab reported that they will run ?

## 2021-09-03 ENCOUNTER — Other Ambulatory Visit: Payer: Self-pay

## 2021-09-04 ENCOUNTER — Other Ambulatory Visit: Payer: Self-pay

## 2021-09-17 ENCOUNTER — Ambulatory Visit (INDEPENDENT_AMBULATORY_CARE_PROVIDER_SITE_OTHER): Payer: Self-pay | Admitting: Cardiovascular Disease

## 2021-09-17 ENCOUNTER — Encounter: Payer: Self-pay | Admitting: Cardiovascular Disease

## 2021-09-17 VITALS — BP 124/80 | HR 59 | Ht 74.0 in | Wt 222.0 lb

## 2021-09-17 DIAGNOSIS — R55 Syncope and collapse: Secondary | ICD-10-CM

## 2021-09-17 DIAGNOSIS — I447 Left bundle-branch block, unspecified: Secondary | ICD-10-CM

## 2021-09-17 NOTE — Progress Notes (Signed)
? ? ?Chief Complaint  ?Patient presents with  ? New Patient (Initial Visit)  ?  Syncope  ? ?History of Present Illness: 36 yo male with history of alcohol abuse, anxiety, depression and HTN here today as a new patient for the evaluation of syncope. He was driving on 0/53/97 and had just found his BP to be over 200 systolic. felt his heart racing and passed out. He was seen in the ED. EKG with sinus with LBBB. 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 He has felt normal since then. No chest pain or dyspnea. He is very active. He exercises regularly and has no exertional dizziness. He has no palpitations. He notes that his anxiety is his biggest problem. He is an Journalist, newspaper.  ? ?Primary Care Physician: Georganna Skeans, MD ? ?Past Medical History:  ?Diagnosis Date  ? Alcohol withdrawal (HCC) 07/20/2019  ? Anxiety   ? Anxiety   ? Depression   ? H/O: substance abuse (HCC)   ? Hypertension   ? ?Past Surgical History:  ?Procedure Laterality Date  ? WISDOM TOOTH EXTRACTION    ? ? ?Current Outpatient Medications  ?Medication Sig Dispense Refill  ? cloNIDine (CATAPRES) 0.1 MG tablet Take 1 tablet (0.1 mg total) by mouth 3 (three) times daily. 90 tablet 0  ? escitalopram (LEXAPRO) 20 MG tablet Take 1 tablet (20 mg total) by mouth daily. 90 tablet 0  ? gabapentin (NEURONTIN) 400 MG capsule Take 1 capsule (400 mg total) by mouth 3 (three) times daily. 90 capsule 0  ? hydrOXYzine (ATARAX) 25 MG tablet Take 1 tablet (25 mg total) by mouth 3 (three) times daily as needed for anxiety. 60 tablet 1  ? Multiple Vitamin (MULTIVITAMIN WITH MINERALS) TABS tablet Take 1 tablet by mouth daily. 30 tablet 0  ? propranolol (INDERAL) 20 MG tablet Take 1 tablet (20 mg total) by mouth 3 (three) times daily. 270 tablet 0  ? QUEtiapine (SEROQUEL) 100 MG tablet Take 1 tablet (100 mg total) by mouth at bedtime. 90 tablet 0  ? thiamine 100 MG tablet Take 1 tablet (100 mg total) by mouth daily. 30 tablet 0  ? ?No  current facility-administered medications for this visit.  ? ? ?No Known Allergies ? ?Social History  ? ?Socioeconomic History  ? Marital status: Single  ?  Spouse name: Not on file  ? Number of children: 0  ? Years of education: Not on file  ? Highest education level: Not on file  ?Occupational History  ? Occupation: Journalist, newspaper  ?Tobacco Use  ? Smoking status: Never  ?  Passive exposure: Past  ? Smokeless tobacco: Never  ?Vaping Use  ? Vaping Use: Never used  ?Substance and Sexual Activity  ? Alcohol use: Not Currently  ? Drug use: Not Currently  ?  Types: Marijuana  ?  Comment: Vicodin Abuse, BEnzos  ? Sexual activity: Not Currently  ?Other Topics Concern  ? Not on file  ?Social History Narrative  ? Not on file  ? ?Social Determinants of Health  ? ?Financial Resource Strain: Not on file  ?Food Insecurity: Not on file  ?Transportation Needs: Not on file  ?Physical Activity: Not on file  ?Stress: Not on file  ?Social Connections: Not on file  ?Intimate Partner Violence: Not on file  ? ? ?Family History  ?Problem Relation Age of Onset  ? Cancer Mother   ? Hypertension Paternal Grandfather   ? Heart disease Paternal Grandfather   ?  MIs  ? Heart disease Other   ? Stroke Other   ? ? ?Review of Systems:  As stated in the HPI and otherwise negative.  ? ?BP 124/80   Pulse (!) 59   Ht 6\' 2"  (1.88 m)   Wt 222 lb (100.7 kg)   SpO2 99%   BMI 28.50 kg/m?  ? ?Physical Examination: ?General: Well developed, well nourished, NAD  ?HEENT: OP clear, mucus membranes moist  ?SKIN: warm, dry. No rashes. ?Neuro: No focal deficits  ?Musculoskeletal: Muscle strength 5/5 all ext  ?Psychiatric: Mood and affect normal  ?Neck: No JVD, no carotid bruits, no thyromegaly, no lymphadenopathy.  ?Lungs:Clear bilaterally, no wheezes, rhonci, crackles ?Cardiovascular: Regular rate and rhythm. No murmurs, gallops or rubs. ?Abdomen:Soft. Bowel sounds present. Non-tender.  ?Extremities: No lower extremity edema. Pulses are 2 + in the  bilateral DP/PT. ? ?EKG:  EKG is not ordered today. ?The ekg from 09/02/21 is reviewed and shows sinus with LBBB. The LBBB was present on his EKG in December 2022 as well.  ? ?Recent Labs: ?06/08/2021: ALT 77; TSH 1.306 ?09/02/2021: BUN 9; Creatinine, Ser 1.41; Hemoglobin 16.9; Magnesium 2.0; Platelets 229; Potassium 4.1; Sodium 137  ? ?Lipid Panel ?   ?Component Value Date/Time  ? CHOL 288 (H) 06/08/2021 1317  ? TRIG 258 (H) 06/08/2021 1317  ? HDL 40 (L) 06/08/2021 1317  ? CHOLHDL 7.2 06/08/2021 1317  ? VLDL 52 (H) 06/08/2021 1317  ? LDLCALC 196 (H) 06/08/2021 1317  ? ?  ?Wt Readings from Last 3 Encounters:  ?09/17/21 222 lb (100.7 kg)  ?09/02/21 230 lb (104.3 kg)  ?08/04/21 230 lb (104.3 kg)  ?  ? ?Assessment and Plan:  ? ?1. Syncope: Unclear etiology of his syncope. He has no concerning daily symptoms. He was profoundly hypertensive the afternoon that he passed out and he was anxious. Possible vasovagal syncope. Given chronic LBBB and isolated syncopal event, will arrange an echo to assess LVEF and exclude structural heart disease.  ? ?Labs/ tests ordered today include:  ? ?Orders Placed This Encounter  ?Procedures  ? ECHOCARDIOGRAM COMPLETE  ? ?Disposition:   F/U with me as needed.  ? ?Signed, ?08/06/21, MD ?09/17/2021 4:33 PM    ?[facility] ?[address] [place], [place], [place]  [place] ?Phone: [phone]; Fax: [phone]  ? ? ?

## 2021-09-17 NOTE — Patient Instructions (Signed)
Medication Instructions:  ?No changes ?*If you need a refill on your cardiac medications before your next appointment, please call your pharmacy* ? ? ?Lab Work: ?none ?If you have labs (blood work) drawn today and your tests are completely normal, you will receive your results only by: ?MyChart Message (if you have MyChart) OR ?A paper copy in the mail ?If you have any lab test that is abnormal or we need to change your treatment, we will call you to review the results. ? ? ?Testing/Procedures: ?Your physician has requested that you have an echocardiogram. Echocardiography is a painless test that uses sound waves to create images of your heart. It provides your doctor with information about the size and shape of your heart and how well your heart?s chambers and valves are working. This procedure takes approximately one hour. There are no restrictions for this procedure. ? ? ?Follow-Up: ?As needed based on results ? ? ?Other Instructions ?  ?

## 2021-09-30 ENCOUNTER — Other Ambulatory Visit: Payer: Self-pay

## 2021-09-30 ENCOUNTER — Other Ambulatory Visit: Payer: Self-pay | Admitting: Family Medicine

## 2021-09-30 MED ORDER — CLONIDINE HCL 0.1 MG PO TABS
0.1000 mg | ORAL_TABLET | Freq: Three times a day (TID) | ORAL | 0 refills | Status: DC
  Filled 2021-09-30: qty 90, 30d supply, fill #0

## 2021-09-30 MED ORDER — GABAPENTIN 400 MG PO CAPS
400.0000 mg | ORAL_CAPSULE | Freq: Three times a day (TID) | ORAL | 2 refills | Status: DC
Start: 2021-09-30 — End: 2021-12-28
  Filled 2021-09-30: qty 90, 30d supply, fill #0
  Filled 2021-10-30: qty 90, 30d supply, fill #1
  Filled 2021-12-01: qty 90, 30d supply, fill #2

## 2021-09-30 NOTE — Telephone Encounter (Signed)
Requested Prescriptions  ?Pending Prescriptions Disp Refills  ?? gabapentin (NEURONTIN) 400 MG capsule 90 capsule 0  ?  Sig: Take 1 capsule (400 mg total) by mouth 3 (three) times daily.  ?  ? Neurology: Anticonvulsants - gabapentin Failed - 09/30/2021 10:01 AM  ?  ?  Failed - Cr in normal range and within 360 days  ?  Creatinine  ?Date Value Ref Range Status  ?03/01/2012 1.12 0.60 - 1.30 mg/dL Final  ? ?Creatinine, Ser  ?Date Value Ref Range Status  ?09/02/2021 1.41 (H) 0.61 - 1.24 mg/dL Final  ?   ?  ?  Passed - Completed PHQ-2 or PHQ-9 in the last 360 days  ?  ?  Passed - Valid encounter within last 12 months  ?  Recent Outpatient Visits   ?      ? 2 months ago Essential hypertension, benign  ? [facility] at [place], MD  ? 3 months ago Insomnia, unspecified type  ? Primary Care at [facility], Gildardo Pounds, NP  ? 4 months ago Anxiety  ? Primary Care at [facility], Gildardo Pounds, NP  ? 7 months ago Anxiety  ? Primary Care at [facility], MD  ? 10 months ago Hospital discharge follow-up  ? Primary Care at [facility], Kandee Keen, MD  ?  ?  ? ?  ?  ?  ? ? ?

## 2021-09-30 NOTE — Telephone Encounter (Signed)
Requested Prescriptions  ?Pending Prescriptions Disp Refills  ?? cloNIDine (CATAPRES) 0.1 MG tablet 90 tablet 0  ?  Sig: Take 1 tablet (0.1 mg total) by mouth 3 (three) times daily.  ?  ? Cardiovascular:  Alpha-2 Agonists Passed - 09/30/2021  7:53 AM  ?  ?  Passed - Last BP in normal range  ?  BP Readings from Last 1 Encounters:  ?09/17/21 124/80  ?   ?  ?  Passed - Last Heart Rate in normal range  ?  Pulse Readings from Last 1 Encounters:  ?09/17/21 (!) 59  ?   ?  ?  Passed - Valid encounter within last 6 months  ?  Recent Outpatient Visits   ?      ? 2 months ago Essential hypertension, benign  ? Primary Care at [facility], MD  ? 3 months ago Insomnia, unspecified type  ? Primary Care at [facility], Gildardo Pounds, NP  ? 4 months ago Anxiety  ? Primary Care at [facility], Gildardo Pounds, NP  ? 7 months ago Anxiety  ? Primary Care at [facility], MD  ? 10 months ago Hospital discharge follow-up  ? Primary Care at [facility], Kandee Keen, MD  ?  ?  ? ?  ?  ?  ? ? ?

## 2021-10-01 ENCOUNTER — Other Ambulatory Visit: Payer: Self-pay

## 2021-10-02 ENCOUNTER — Other Ambulatory Visit: Payer: Self-pay

## 2021-10-02 ENCOUNTER — Ambulatory Visit (HOSPITAL_COMMUNITY): Payer: No Typology Code available for payment source | Attending: Internal Medicine

## 2021-10-02 DIAGNOSIS — R55 Syncope and collapse: Secondary | ICD-10-CM | POA: Diagnosis not present

## 2021-10-02 LAB — ECHOCARDIOGRAM COMPLETE
Area-P 1/2: 2.66 cm2
S' Lateral: 4.2 cm

## 2021-10-27 ENCOUNTER — Other Ambulatory Visit: Payer: Self-pay

## 2021-10-27 ENCOUNTER — Other Ambulatory Visit: Payer: Self-pay | Admitting: Family Medicine

## 2021-10-28 ENCOUNTER — Telehealth: Payer: Self-pay | Admitting: Family Medicine

## 2021-10-29 NOTE — Telephone Encounter (Signed)
Requested medication (s) are due for refill today: yes ? ?Requested medication (s) are on the active medication list: yes ? ?Last refill:  08/12/21 ? ?Future visit scheduled: no ? ?Notes to clinic:  Unable to refill per protocol, Rx was refused 10/27/21 due to pt needing an OV. Duplicate request. ? ? ?  ?Requested Prescriptions  ?Pending Prescriptions Disp Refills  ? propranolol (INDERAL) 20 MG tablet 270 tablet 0  ?  Sig: Take 1 tablet (20 mg total) by mouth 3 (three) times daily.  ?  ? Cardiovascular:  Beta Blockers Passed - 10/28/2021  5:52 PM  ?  ?  Passed - Last BP in normal range  ?  BP Readings from Last 1 Encounters:  ?09/17/21 124/80  ?   ?  ?  Passed - Last Heart Rate in normal range  ?  Pulse Readings from Last 1 Encounters:  ?09/17/21 (!) 59  ?   ?  ?  Passed - Valid encounter within last 6 months  ?  Recent Outpatient Visits   ? ?      ? 3 months ago Essential hypertension, benign  ? Primary Care at [facility], MD  ? 4 months ago Insomnia, unspecified type  ? Primary Care at [facility], Kriste Basque, NP  ? 5 months ago Anxiety  ? Primary Care at [facility], Kriste Basque, NP  ? 8 months ago Anxiety  ? Primary Care at [facility], MD  ? 11 months ago Hospital discharge follow-up  ? Primary Care at [facility], Bayard Beaver, MD  ? ?  ?  ? ? ?  ?  ?  ? QUEtiapine (SEROQUEL) 100 MG tablet 90 tablet 0  ?  Sig: Take 1 tablet (100 mg total) by mouth at bedtime.  ?  ? Not Delegated - Psychiatry:  Antipsychotics - Second Generation (Atypical) - quetiapine Failed - 10/28/2021  5:52 PM  ?  ?  Failed - This refill cannot be delegated  ?  ?  Failed - Lipid Panel in normal range within the last 12 months  ?  Cholesterol  ?Date Value Ref Range Status  ?06/08/2021 288 (H) 0 - 200 mg/dL Final  ? ?LDL Cholesterol  ?Date Value Ref Range Status  ?06/08/2021 196 (H) 0 - 99 mg/dL Final  ?  Comment:  ?         ?Total Cholesterol/HDL:CHD Risk ?Coronary Heart Disease  Risk Table ?                    Men   Women ? 1/2 Average Risk   3.4   3.3 ? Average Risk       5.0   4.4 ? 2 X Average Risk   9.6   7.1 ? 3 X Average Risk  23.4   11.0 ?       ?Use the calculated Patient Ratio ?above and the CHD Risk Table ?to determine the patient's CHD Risk. ?       ?ATP III CLASSIFICATION (LDL): ? <100     mg/dL   Optimal ? 100-129  mg/dL   Near or Above ?                   Optimal ? 130-159  mg/dL   Borderline ? 160-189  mg/dL   High ? >190     mg/dL   Very High ?Performed at [facility], [place]  [address], [place], [place] [address] ?  ? ?HDL  ?Date Value Ref Range Status  ?06/08/2021 40 (L) >40 mg/dL Final  ? ?Triglycerides  ?Date Value Ref Range Status  ?06/08/2021 258 (H) <150 mg/dL Final  ? ?  ?  ?  Passed - TSH in normal range and within 360 days  ?  TSH  ?Date Value Ref Range Status  ?06/08/2021 1.306 0.350 - 4.500 uIU/mL Final  ?  Comment:  ?  Performed by a 3rd Generation assay with a functional sensitivity of <=0.01 uIU/mL. ?Performed at [facility], [place] [address], [place], [place] [address] ?  ?05/11/2018 2.090 0.450 - 4.500 uIU/mL Final  ?   ?  ?  Passed - Completed PHQ-2 or PHQ-9 in the last 360 days  ?  ?  Passed - Last BP in normal range  ?  BP Readings from Last 1 Encounters:  ?09/17/21 124/80  ?   ?  ?  Passed - Last Heart Rate in normal range  ?  Pulse Readings from Last 1 Encounters:  ?09/17/21 (!) 59  ?   ?  ?  Passed - Valid encounter within last 6 months  ?  Recent Outpatient Visits   ? ?      ? 3 months ago Essential hypertension, benign  ? Primary Care at [facility], MD  ? 4 months ago Insomnia, unspecified type  ? Primary Care at [facility], Kriste Basque, NP  ? 5 months ago Anxiety  ? Primary Care at [facility], Kriste Basque, NP  ? 8 months ago Anxiety  ? Primary Care at [facility], MD  ? 11 months ago Hospital discharge follow-up  ? Primary Care at [facility], Bayard Beaver, MD  ? ?  ?   ? ? ?  ?  ?  Passed - CBC within normal limits and completed in the last 12 months  ?  WBC  ?Date Value Ref Range Status  ?09/02/2021 7.9 4.0 - 10.5 K/uL Final  ? ?RBC  ?Date Value Ref Range Status  ?09/02/2021 5.23 4.22 - 5.81 MIL/uL Final  ? ?Hemoglobin  ?Date Value Ref Range Status  ?09/02/2021 16.9 13.0 - 17.0 g/dL Final  ?05/11/2018 15.9 13.0 - 17.7 g/dL Final  ? ?HCT  ?Date Value Ref Range Status  ?09/02/2021 49.3 39.0 - 52.0 % Final  ? ?Hematocrit  ?Date Value Ref Range Status  ?05/11/2018 48.4 37.5 - 51.0 % Final  ? ?MCHC  ?Date Value Ref Range Status  ?09/02/2021 34.3 30.0 - 36.0 g/dL Final  ? ?MCH  ?Date Value Ref Range Status  ?09/02/2021 32.3 26.0 - 34.0 pg Final  ? ?MCV  ?Date Value Ref Range Status  ?09/02/2021 94.3 80.0 - 100.0 fL Final  ?05/11/2018 91 79 - 97 fL Final  ?03/01/2012 92 80 - 100 fL Final  ? ?No results found for: PLTCOUNTKUC, LABPLAT, [place] ?RDW  ?Date Value Ref Range Status  ?09/02/2021 14.1 11.5 - 15.5 % Final  ?05/11/2018 13.5 12.3 - 15.4 % Final  ?03/01/2012 13.4 11.5 - 14.5 % Final  ? ?  ?  ?  Passed - CMP within normal limits and completed in the last 12 months  ?  Albumin  ?Date Value Ref Range Status  ?06/08/2021 4.7 3.5 - 5.0 g/dL Final  ?08/29/2019 4.5 4.0 - 5.0 g/dL Final  ?03/01/2012 4.5 3.4 - 5.0 g/dL Final  ? ?Alkaline Phosphatase  ?Date Value Ref Range Status  ?  06/08/2021 52 38 - 126 U/L Final  ?03/01/2012 76 50 - 136 Unit/L Final  ? ?ALT  ?Date Value Ref Range Status  ?06/08/2021 77 (H) 0 - 44 U/L Final  ? ?SGPT (ALT)  ?Date Value Ref Range Status  ?03/01/2012 28 12 - 78 U/L Final  ? ?AST  ?Date Value Ref Range Status  ?06/08/2021 51 (H) 15 - 41 U/L Final  ? ?SGOT(AST)  ?Date Value Ref Range Status  ?03/01/2012 17 15 - 37 Unit/L Final  ? ?BUN  ?Date Value Ref Range Status  ?09/02/2021 9 6 - 20 mg/dL Final  ?05/11/2018 19 6 - 20 mg/dL Final  ?03/01/2012 10 7 - 18 mg/dL Final  ? ?Calcium  ?Date Value Ref Range Status  ?09/02/2021 9.2 8.9 - 10.3 mg/dL Final  ? ?Calcium,  Total  ?Date Value Ref Range Status  ?03/01/2012 9.7 8.5 - 10.1 mg/dL Final  ? ?Calcium, Ion  ?Date Value Ref Range Status  ?05/04/2010 1.13 1.12 - 1.32 mmol/L Final  ? ?CO2  ?Date Value Ref Range Status  ?09/02/2021 27 22 - 32 mmol/L Final  ? ?Co2  ?Date Value Ref Range Status  ?03/01/2012 25 21 - 32 mmol/L Final  ? ?TCO2  ?Date Value Ref Range Status  ?05/04/2010 30 0 - 100 mmol/L Final  ? ?Creatinine  ?Date Value Ref Range Status  ?03/01/2012 1.12 0.60 - 1.30 mg/dL Final  ? ?Creatinine, Ser  ?Date Value Ref Range Status  ?09/02/2021 1.41 (H) 0.61 - 1.24 mg/dL Final  ? ?Glucose  ?Date Value Ref Range Status  ?03/01/2012 127 (H) 65 - 99 mg/dL Final  ? ?Glucose, Bld  ?Date Value Ref Range Status  ?09/02/2021 106 (H) 70 - 99 mg/dL Final  ?  Comment:  ?  Glucose reference range applies only to samples taken after fasting for at least 8 hours.  ? ?Glucose-Capillary  ?Date Value Ref Range Status  ?07/28/2019 117 (H) 70 - 99 mg/dL Final  ? ?Potassium  ?Date Value Ref Range Status  ?09/02/2021 4.1 3.5 - 5.1 mmol/L Final  ?03/01/2012 3.6 3.5 - 5.1 mmol/L Final  ? ?Sodium  ?Date Value Ref Range Status  ?09/02/2021 137 135 - 145 mmol/L Final  ?05/11/2018 138 134 - 144 mmol/L Final  ?03/01/2012 135 (L) 136 - 145 mmol/L Final  ? ?Total Bilirubin  ?Date Value Ref Range Status  ?06/08/2021 0.8 0.3 - 1.2 mg/dL Final  ? ?Bilirubin,Total  ?Date Value Ref Range Status  ?03/01/2012 0.3 0.2 - 1.0 mg/dL Final  ? ?Bilirubin Total  ?Date Value Ref Range Status  ?08/29/2019 0.2 0.0 - 1.2 mg/dL Final  ? ?Bilirubin, Direct  ?Date Value Ref Range Status  ?06/13/2020 0.2 0.0 - 0.2 mg/dL Final  ?08/29/2019 0.12 0.00 - 0.40 mg/dL Final  ? ?Indirect Bilirubin  ?Date Value Ref Range Status  ?06/13/2020 0.7 0.3 - 0.9 mg/dL Final  ?  Comment:  ?  Performed at [facility], [place] [address] [place], [place] [address]  ? ?Protein, ur  ?Date Value Ref Range Status  ?06/08/2021 NEGATIVE NEGATIVE mg/dL Final  ? ?Total Protein  ?Date Value Ref Range  Status  ?06/08/2021 8.2 (H) 6.5 - 8.1 g/dL Final  ?08/29/2019 6.9 6.0 - 8.5 g/dL Final  ?03/01/2012 8.0 6.4 - 8.2 g/dL Final  ? ?EGFR (African American)  ?Date Value Ref Range Status  ?03/01/2012 >60  Final

## 2021-10-30 ENCOUNTER — Other Ambulatory Visit: Payer: Self-pay

## 2021-10-30 ENCOUNTER — Other Ambulatory Visit: Payer: Self-pay | Admitting: Family Medicine

## 2021-10-30 MED ORDER — CLONIDINE HCL 0.1 MG PO TABS
0.1000 mg | ORAL_TABLET | Freq: Three times a day (TID) | ORAL | 0 refills | Status: DC
  Filled 2021-10-30: qty 90, 30d supply, fill #0

## 2021-10-30 MED ORDER — PROPRANOLOL HCL 20 MG PO TABS
20.0000 mg | ORAL_TABLET | Freq: Three times a day (TID) | ORAL | 0 refills | Status: DC
  Filled 2021-10-30: qty 90, 30d supply, fill #0
  Filled 2021-12-01: qty 90, 30d supply, fill #1
  Filled 2021-12-28: qty 90, 30d supply, fill #2

## 2021-10-30 MED ORDER — QUETIAPINE FUMARATE 100 MG PO TABS
100.0000 mg | ORAL_TABLET | Freq: Every day | ORAL | 0 refills | Status: DC
  Filled 2021-10-30: qty 30, 30d supply, fill #0
  Filled 2021-12-01: qty 30, 30d supply, fill #1
  Filled 2021-12-28: qty 30, 30d supply, fill #2

## 2021-10-30 NOTE — Telephone Encounter (Signed)
Patient called on CB number left [phone], left VM to return the call to the office. If patient returns call, let him know the medication was sent in today around 1330. ?

## 2021-10-30 NOTE — Telephone Encounter (Signed)
Pt is calling to check on the status of the medication refills. Pt reports that he has been out of the medications for 2 days. Please advise CB- [phone] ?

## 2021-12-01 ENCOUNTER — Other Ambulatory Visit: Payer: Self-pay

## 2021-12-01 ENCOUNTER — Other Ambulatory Visit: Payer: Self-pay | Admitting: Family Medicine

## 2021-12-01 DIAGNOSIS — I10 Essential (primary) hypertension: Secondary | ICD-10-CM

## 2021-12-01 MED ORDER — CLONIDINE HCL 0.1 MG PO TABS
0.1000 mg | ORAL_TABLET | Freq: Three times a day (TID) | ORAL | 0 refills | Status: DC
  Filled 2021-12-01: qty 90, 30d supply, fill #0

## 2021-12-01 NOTE — Telephone Encounter (Signed)
Refilled per patient request. 

## 2021-12-02 ENCOUNTER — Other Ambulatory Visit: Payer: Self-pay

## 2021-12-03 ENCOUNTER — Other Ambulatory Visit: Payer: Self-pay

## 2021-12-28 ENCOUNTER — Other Ambulatory Visit: Payer: Self-pay | Admitting: Family

## 2021-12-28 ENCOUNTER — Other Ambulatory Visit: Payer: Self-pay

## 2021-12-28 ENCOUNTER — Other Ambulatory Visit: Payer: Self-pay | Admitting: Family Medicine

## 2021-12-28 DIAGNOSIS — I10 Essential (primary) hypertension: Secondary | ICD-10-CM

## 2021-12-28 MED ORDER — GABAPENTIN 400 MG PO CAPS
400.0000 mg | ORAL_CAPSULE | Freq: Three times a day (TID) | ORAL | 2 refills | Status: DC
  Filled 2021-12-28: qty 90, 30d supply, fill #0
  Filled 2022-01-25: qty 90, 30d supply, fill #1
  Filled 2022-02-22: qty 90, 30d supply, fill #2

## 2021-12-29 ENCOUNTER — Other Ambulatory Visit: Payer: Self-pay

## 2021-12-30 ENCOUNTER — Other Ambulatory Visit: Payer: Self-pay | Admitting: Family Medicine

## 2021-12-30 ENCOUNTER — Other Ambulatory Visit: Payer: Self-pay | Admitting: *Deleted

## 2021-12-30 ENCOUNTER — Other Ambulatory Visit: Payer: Self-pay

## 2021-12-30 ENCOUNTER — Ambulatory Visit: Payer: Self-pay | Admitting: *Deleted

## 2021-12-30 DIAGNOSIS — I10 Essential (primary) hypertension: Secondary | ICD-10-CM

## 2021-12-30 MED ORDER — CLONIDINE HCL 0.1 MG PO TABS
0.1000 mg | ORAL_TABLET | Freq: Three times a day (TID) | ORAL | 0 refills | Status: DC
  Filled 2021-12-30: qty 90, 30d supply, fill #0

## 2021-12-30 NOTE — Telephone Encounter (Signed)
Patient calling to request refill on clonidine (catapres) 0.1 mg 3 times daily. Request refused 12/29/21  all protocols met. Please advise regarding refill due to patient only has 3-5 pills left.     Reason for Disposition  [1] Prescription refill request for ESSENTIAL medicine (i.e., likelihood of harm to patient if not taken) AND [2] triager unable to refill per department policy  Answer Assessment - Initial Assessment Questions 1. DRUG NAME: "What medicine do you need to have refilled?"     Clonidine (catapres) 0.1 mg 3 times daily  2. REFILLS REMAINING: "How many refills are remaining?" (Note: The label on the medicine or pill bottle will show how many refills are remaining. If there are no refills remaining, then a renewal may be needed.)     0 3. EXPIRATION DATE: "What is the expiration date?" (Note: The label states when the prescription will expire, and thus can no longer be refilled.)     na 4. PRESCRIBING HCP: "Who prescribed it?" Reason: If prescribed by specialist, call should be referred to that group.     PCP 5. SYMPTOMS: "Do you have any symptoms?"     No . Only has 3-4 tablets left  6. PREGNANCY: "Is there any chance that you are pregnant?" "When was your last menstrual period?"     na  Protocols used: Medication Refill and Renewal Call-A-AH

## 2021-12-30 NOTE — Telephone Encounter (Signed)
Please review and advise.

## 2021-12-31 ENCOUNTER — Other Ambulatory Visit: Payer: Self-pay

## 2022-01-25 ENCOUNTER — Other Ambulatory Visit: Payer: Self-pay | Admitting: *Deleted

## 2022-01-25 ENCOUNTER — Other Ambulatory Visit: Payer: Self-pay

## 2022-01-25 ENCOUNTER — Other Ambulatory Visit: Payer: Self-pay | Admitting: Family Medicine

## 2022-01-25 DIAGNOSIS — I10 Essential (primary) hypertension: Secondary | ICD-10-CM

## 2022-01-25 MED ORDER — CLONIDINE HCL 0.1 MG PO TABS
0.1000 mg | ORAL_TABLET | Freq: Three times a day (TID) | ORAL | 0 refills | Status: DC
  Filled 2022-01-25: qty 90, 30d supply, fill #0

## 2022-01-26 ENCOUNTER — Other Ambulatory Visit: Payer: Self-pay

## 2022-02-11 ENCOUNTER — Other Ambulatory Visit: Payer: Self-pay

## 2022-02-11 ENCOUNTER — Ambulatory Visit (INDEPENDENT_AMBULATORY_CARE_PROVIDER_SITE_OTHER): Payer: Self-pay | Admitting: Family Medicine

## 2022-02-11 ENCOUNTER — Encounter: Payer: Self-pay | Admitting: Family Medicine

## 2022-02-11 VITALS — BP 158/100 | HR 73 | Temp 98.1°F | Resp 16 | Wt 222.0 lb

## 2022-02-11 DIAGNOSIS — I10 Essential (primary) hypertension: Secondary | ICD-10-CM

## 2022-02-11 DIAGNOSIS — G4700 Insomnia, unspecified: Secondary | ICD-10-CM

## 2022-02-11 DIAGNOSIS — F419 Anxiety disorder, unspecified: Secondary | ICD-10-CM

## 2022-02-11 MED ORDER — METOPROLOL TARTRATE 25 MG PO TABS
25.0000 mg | ORAL_TABLET | Freq: Two times a day (BID) | ORAL | 1 refills | Status: DC
  Filled 2022-02-11: qty 30, 15d supply, fill #0
  Filled 2022-02-22: qty 30, 15d supply, fill #1

## 2022-02-11 MED ORDER — LISINOPRIL 10 MG PO TABS
10.0000 mg | ORAL_TABLET | Freq: Every day | ORAL | 1 refills | Status: DC
  Filled 2022-02-11: qty 60, 60d supply, fill #0

## 2022-02-11 MED ORDER — ESCITALOPRAM OXALATE 20 MG PO TABS
20.0000 mg | ORAL_TABLET | Freq: Every day | ORAL | 0 refills | Status: DC
  Filled 2022-02-11: qty 90, 90d supply, fill #0
  Filled 2022-02-22 – 2022-03-18 (×2): qty 30, 30d supply, fill #0

## 2022-02-11 MED ORDER — QUETIAPINE FUMARATE 100 MG PO TABS
100.0000 mg | ORAL_TABLET | Freq: Every day | ORAL | 0 refills | Status: DC
  Filled 2022-02-11: qty 90, 90d supply, fill #0
  Filled 2022-02-22 – 2022-03-16 (×2): qty 30, 30d supply, fill #0

## 2022-02-12 ENCOUNTER — Other Ambulatory Visit: Payer: Self-pay

## 2022-02-12 ENCOUNTER — Encounter: Payer: Self-pay | Admitting: Family Medicine

## 2022-02-12 NOTE — Progress Notes (Signed)
Established Patient Office Visit  Subjective    Patient ID: Eric Keller, male    DOB: 12-Apr-1986  Age: 36 y.o. MRN: [id number]  CC:  Chief Complaint  Patient presents with   Medication Refill    HPI [name] presents for routine follow up of chronic med issues including hypertension. Patient denies acute complaints or concerns.    Outpatient Encounter Medications as of 02/11/2022  Medication Sig   gabapentin (NEURONTIN) 400 MG capsule Take 1 capsule (400 mg total) by mouth 3 (three) times daily.   hydrOXYzine (ATARAX) 25 MG tablet Take 1 tablet (25 mg total) by mouth 3 (three) times daily as needed for anxiety.   lisinopril (ZESTRIL) 10 MG tablet Take 1 tablet (10 mg total) by mouth daily.   metoprolol tartrate (LOPRESSOR) 25 MG tablet Take 1 tablet (25 mg total) by mouth 2 (two) times daily.   Multiple Vitamin (MULTIVITAMIN WITH MINERALS) TABS tablet Take 1 tablet by mouth daily.   thiamine 100 MG tablet Take 1 tablet (100 mg total) by mouth daily.   [DISCONTINUED] cloNIDine (CATAPRES) 0.1 MG tablet Take 1 tablet (0.1 mg total) by mouth 3 (three) times daily.   [DISCONTINUED] escitalopram (LEXAPRO) 20 MG tablet Take 1 tablet (20 mg total) by mouth daily.   [DISCONTINUED] propranolol (INDERAL) 20 MG tablet Take 1 tablet (20 mg total) by mouth 3 (three) times daily.   [DISCONTINUED] QUEtiapine (SEROQUEL) 100 MG tablet Take 1 tablet (100 mg total) by mouth at bedtime.   escitalopram (LEXAPRO) 20 MG tablet Take 1 tablet (20 mg total) by mouth daily.   QUEtiapine (SEROQUEL) 100 MG tablet Take 1 tablet (100 mg total) by mouth at bedtime.   No facility-administered encounter medications on file as of 02/11/2022.    Past Medical History:  Diagnosis Date   Alcohol withdrawal (HCC) 07/20/2019   Anxiety    Anxiety    Depression    H/O: substance abuse (HCC)    Hypertension     Past Surgical History:  Procedure Laterality Date   WISDOM TOOTH EXTRACTION      Family  History  Problem Relation Age of Onset   Cancer Mother    Hypertension Paternal Grandfather    Heart disease Paternal Grandfather        MIs   Heart disease Other    Stroke Other     Social History   Socioeconomic History   Marital status: Single    Spouse name: Not on file   Number of children: 0   Years of education: Not on file   Highest education level: Not on file  Occupational History   Occupation: Journalist, newspaper  Tobacco Use   Smoking status: Never    Passive exposure: Past   Smokeless tobacco: Never  Vaping Use   Vaping Use: Never used  Substance and Sexual Activity   Alcohol use: Not Currently   Drug use: Not Currently    Types: Marijuana    Comment: Vicodin Abuse, BEnzos   Sexual activity: Not Currently  Other Topics Concern   Not on file  Social History Narrative   Not on file   Social Determinants of Health   Financial Resource Strain: Not on file  Food Insecurity: Not on file  Transportation Needs: Not on file  Physical Activity: Not on file  Stress: Not on file  Social Connections: Not on file  Intimate Partner Violence: Not on file    Review of Systems  All other systems reviewed and are  negative.       Objective    BP (!) 158/100 Comment: out of meds x days  Pulse 73   Temp 98.1 F (36.7 C) (Oral)   Resp 16   Wt 222 lb (100.7 kg)   SpO2 98%   BMI 28.50 kg/m   Physical Exam Vitals and nursing note reviewed.  Constitutional:      General: He is not in acute distress. Cardiovascular:     Rate and Rhythm: Normal rate and regular rhythm.  Pulmonary:     Effort: Pulmonary effort is normal.     Breath sounds: Normal breath sounds.  Abdominal:     Palpations: Abdomen is soft.     Tenderness: There is no abdominal tenderness.  Musculoskeletal:     Right lower leg: No edema.  Neurological:     General: No focal deficit present.     Mental Status: He is alert and oriented to person, place, and time.         Assessment &  Plan:   1. Essential hypertension, benign Elevated reading. Will change meds/regimen to be more compliance friendly. Will wean clonidine and d/c propranolol. Will prescribe lisinopril and metoprolol and monitor  2. Anxiety Appears stable. Lexapro refilled  3. Insomnia, unspecified type Trazodone refilled    Return in about 4 weeks (around 03/11/2022) for follow up.   Eric Raymond, MD

## 2022-02-17 ENCOUNTER — Other Ambulatory Visit: Payer: Self-pay

## 2022-02-22 ENCOUNTER — Other Ambulatory Visit: Payer: Self-pay | Admitting: Family Medicine

## 2022-02-23 ENCOUNTER — Other Ambulatory Visit: Payer: Self-pay

## 2022-02-24 NOTE — Telephone Encounter (Signed)
Requested by interface surescripts. Last dispensed 02/12/22. Requested too soon.  Requested Prescriptions  Refused Prescriptions Disp Refills  . lisinopril (ZESTRIL) 10 MG tablet 30 tablet 1    Sig: Take 1 tablet (10 mg total) by mouth daily.     Cardiovascular:  ACE Inhibitors Failed - 02/22/2022  4:19 PM      Failed - Cr in normal range and within 180 days    Creatinine  Date Value Ref Range Status  03/01/2012 1.12 0.60 - 1.30 mg/dL Final   Creatinine, Ser  Date Value Ref Range Status  09/02/2021 1.41 (H) 0.61 - 1.24 mg/dL Final         Failed - Last BP in normal range    BP Readings from Last 1 Encounters:  02/11/22 (!) 158/100         Passed - K in normal range and within 180 days    Potassium  Date Value Ref Range Status  09/02/2021 4.1 3.5 - 5.1 mmol/L Final  03/01/2012 3.6 3.5 - 5.1 mmol/L Final         Passed - Patient is not pregnant      Passed - Valid encounter within last 6 months    Recent Outpatient Visits          1 week ago Essential hypertension, benign   Primary [facility] at [facility], MD   7 months ago Essential hypertension, benign   Primary Care at [facility], MD   8 months ago Insomnia, unspecified type   Primary Care at [facility], Gildardo Pounds, NP   9 months ago Anxiety   Primary Care at [facility], Gildardo Pounds, NP   1 year ago Anxiety   Primary Care at [facility], MD      Future Appointments            In 1 week Georganna Skeans, MD Primary Care at [facility]

## 2022-02-26 ENCOUNTER — Other Ambulatory Visit: Payer: Self-pay

## 2022-03-02 ENCOUNTER — Other Ambulatory Visit: Payer: Self-pay

## 2022-03-08 ENCOUNTER — Ambulatory Visit: Payer: Self-pay | Admitting: Family Medicine

## 2022-03-15 ENCOUNTER — Other Ambulatory Visit: Payer: Self-pay

## 2022-03-15 ENCOUNTER — Other Ambulatory Visit: Payer: Self-pay | Admitting: Family Medicine

## 2022-03-15 NOTE — Telephone Encounter (Signed)
Medication Refill - Medication:  Lisinopril 10 mg  Metoprolol 25 mg  as the patient contacted their pharmacy? Yes.   (Agent: If no, request that the patient contact the pharmacy for the refill. If patient does not wish to contact the pharmacy document the reason why and proceed with request.) (Agent: If yes, when and what did the pharmacy advise?)  Preferred Pharmacy (with phone number or street name): [facility] Has the patient been seen for an appointment in the last year OR does the patient have an upcoming appointment? Yes.    Agent: Please be advised that RX refills may take up to 3 business days. We ask that you follow-up with your pharmacy.

## 2022-03-16 ENCOUNTER — Other Ambulatory Visit: Payer: Self-pay | Admitting: Family Medicine

## 2022-03-16 ENCOUNTER — Other Ambulatory Visit: Payer: Self-pay

## 2022-03-16 MED ORDER — LISINOPRIL 10 MG PO TABS
10.0000 mg | ORAL_TABLET | Freq: Every day | ORAL | 0 refills | Status: DC
  Filled 2022-03-16 (×3): qty 30, 30d supply, fill #0

## 2022-03-16 MED ORDER — LISINOPRIL 10 MG PO TABS
10.0000 mg | ORAL_TABLET | Freq: Every day | ORAL | 1 refills | Status: DC
  Filled 2022-03-16 – 2022-03-18 (×4): qty 30, 30d supply, fill #0

## 2022-03-16 MED ORDER — GABAPENTIN 400 MG PO CAPS
400.0000 mg | ORAL_CAPSULE | Freq: Three times a day (TID) | ORAL | 2 refills | Status: DC
  Filled 2022-03-16 – 2022-03-18 (×2): qty 90, 30d supply, fill #0

## 2022-03-16 MED ORDER — METOPROLOL TARTRATE 25 MG PO TABS
25.0000 mg | ORAL_TABLET | Freq: Two times a day (BID) | ORAL | 0 refills | Status: DC
  Filled 2022-03-16: qty 30, 15d supply, fill #0

## 2022-03-16 NOTE — Telephone Encounter (Signed)
Requested Prescriptions  Pending Prescriptions Disp Refills  . lisinopril (ZESTRIL) 10 MG tablet 30 tablet 0    Sig: Take 1 tablet (10 mg total) by mouth daily.     Cardiovascular:  ACE Inhibitors Failed - 03/15/2022  3:29 PM      Failed - Cr in normal range and within 180 days    Creatinine  Date Value Ref Range Status  03/01/2012 1.12 0.60 - 1.30 mg/dL Final   Creatinine, Ser  Date Value Ref Range Status  09/02/2021 1.41 (H) 0.61 - 1.24 mg/dL Final         Failed - K in normal range and within 180 days    Potassium  Date Value Ref Range Status  09/02/2021 4.1 3.5 - 5.1 mmol/L Final  03/01/2012 3.6 3.5 - 5.1 mmol/L Final         Failed - Last BP in normal range    BP Readings from Last 1 Encounters:  02/11/22 (!) 158/100         Passed - Patient is not pregnant      Passed - Valid encounter within last 6 months    Recent Outpatient Visits          1 month ago Essential hypertension, benign   Primary Care at [facility], MD   7 months ago Essential hypertension, benign   Primary Care at [facility], MD   8 months ago Insomnia, unspecified type   Primary Care at [facility], Kriste Basque, NP   10 months ago Anxiety   Primary Care at [facility], Kriste Basque, NP   1 year ago Anxiety   Primary Care at [facility], MD      Future Appointments            In 1 week Dorna Mai, MD Primary Care at [facility]

## 2022-03-16 NOTE — Telephone Encounter (Signed)
Requested Prescriptions  Pending Prescriptions Disp Refills  . gabapentin (NEURONTIN) 400 MG capsule 90 capsule 2    Sig: Take 1 capsule (400 mg total) by mouth 3 (three) times daily.     Neurology: Anticonvulsants - gabapentin Failed - 03/16/2022  1:18 PM      Failed - Cr in normal range and within 360 days    Creatinine  Date Value Ref Range Status  03/01/2012 1.12 0.60 - 1.30 mg/dL Final   Creatinine, Ser  Date Value Ref Range Status  09/02/2021 1.41 (H) 0.61 - 1.24 mg/dL Final         Passed - Completed PHQ-2 or PHQ-9 in the last 360 days      Passed - Valid encounter within last 12 months    Recent Outpatient Visits          1 month ago Essential hypertension, benign   Primary [facility] at [facility], MD   7 months ago Essential hypertension, benign   Primary Care at [facility], MD   8 months ago Insomnia, unspecified type   Primary Care at [facility], Kriste Basque, NP   10 months ago Anxiety   Primary Care at [facility], Kriste Basque, NP   1 year ago Anxiety   Primary Care at [facility], MD      Future Appointments            In 1 week Dorna Mai, MD Primary Care at [facility]

## 2022-03-16 NOTE — Telephone Encounter (Signed)
Pt checking status of refill request  Pt states he is out of medication  Please assist further

## 2022-03-16 NOTE — Telephone Encounter (Signed)
Requested Prescriptions  Pending Prescriptions Disp Refills  . metoprolol tartrate (LOPRESSOR) 25 MG tablet 30 tablet 0    Sig: Take 1 tablet (25 mg total) by mouth 2 (two) times daily.     Cardiovascular:  Beta Blockers Failed - 03/16/2022 10:38 AM      Failed - Last BP in normal range    BP Readings from Last 1 Encounters:  02/11/22 (!) 158/100         Passed - Last Heart Rate in normal range    Pulse Readings from Last 1 Encounters:  02/11/22 73         Passed - Valid encounter within last 6 months    Recent Outpatient Visits          1 month ago Essential hypertension, benign   Primary Care at [facility], MD   7 months ago Essential hypertension, benign   Primary Care at [facility], MD   8 months ago Insomnia, unspecified type   Primary Care at [facility], Kriste Basque, NP   10 months ago Anxiety   Primary Care at [facility], Kriste Basque, NP   1 year ago Anxiety   Primary Care at [facility], MD      Future Appointments            In 1 week Dorna Mai, MD Primary Care at [facility]           . lisinopril (ZESTRIL) 10 MG tablet 30 tablet 1    Sig: Take 1 tablet (10 mg total) by mouth daily.     Cardiovascular:  ACE Inhibitors Failed - 03/16/2022 10:38 AM      Failed - Cr in normal range and within 180 days    Creatinine  Date Value Ref Range Status  03/01/2012 1.12 0.60 - 1.30 mg/dL Final   Creatinine, Ser  Date Value Ref Range Status  09/02/2021 1.41 (H) 0.61 - 1.24 mg/dL Final         Failed - K in normal range and within 180 days    Potassium  Date Value Ref Range Status  09/02/2021 4.1 3.5 - 5.1 mmol/L Final  03/01/2012 3.6 3.5 - 5.1 mmol/L Final         Failed - Last BP in normal range    BP Readings from Last 1 Encounters:  02/11/22 (!) 158/100         Passed - Patient is not pregnant      Passed - Valid encounter within last 6 months    Recent Outpatient Visits           1 month ago Essential hypertension, benign   Primary Care at [facility], MD   7 months ago Essential hypertension, benign   Primary Care at [facility], MD   8 months ago Insomnia, unspecified type   Primary Care at [facility], Kriste Basque, NP   10 months ago Anxiety   Primary Care at [facility], Kriste Basque, NP   1 year ago Anxiety   Primary Care at [facility], MD      Future Appointments            In 1 week Dorna Mai, MD Primary Care at [facility]

## 2022-03-17 ENCOUNTER — Other Ambulatory Visit: Payer: Self-pay

## 2022-03-18 ENCOUNTER — Other Ambulatory Visit: Payer: Self-pay

## 2022-03-29 ENCOUNTER — Other Ambulatory Visit: Payer: Self-pay

## 2022-03-29 ENCOUNTER — Ambulatory Visit (INDEPENDENT_AMBULATORY_CARE_PROVIDER_SITE_OTHER): Payer: Self-pay | Admitting: Family Medicine

## 2022-03-29 VITALS — BP 139/90 | HR 73 | Temp 97.7°F | Resp 16 | Wt 222.2 lb

## 2022-03-29 DIAGNOSIS — G4700 Insomnia, unspecified: Secondary | ICD-10-CM

## 2022-03-29 DIAGNOSIS — F419 Anxiety disorder, unspecified: Secondary | ICD-10-CM

## 2022-03-29 DIAGNOSIS — I10 Essential (primary) hypertension: Secondary | ICD-10-CM

## 2022-03-29 MED ORDER — QUETIAPINE FUMARATE 100 MG PO TABS
100.0000 mg | ORAL_TABLET | Freq: Every day | ORAL | 0 refills | Status: DC
  Filled 2022-03-29: qty 90, 90d supply, fill #0
  Filled 2022-04-16: qty 30, 30d supply, fill #0
  Filled 2022-05-10 – 2022-05-19 (×2): qty 30, 30d supply, fill #1
  Filled 2022-06-22: qty 30, 30d supply, fill #2

## 2022-03-29 MED ORDER — GABAPENTIN 400 MG PO CAPS
400.0000 mg | ORAL_CAPSULE | Freq: Three times a day (TID) | ORAL | 0 refills | Status: DC
  Filled 2022-03-29: qty 270, 90d supply, fill #0
  Filled 2022-04-16: qty 90, 30d supply, fill #0
  Filled 2022-05-10: qty 90, 30d supply, fill #1
  Filled 2022-06-04: qty 90, 30d supply, fill #2

## 2022-03-29 MED ORDER — LISINOPRIL 20 MG PO TABS
20.0000 mg | ORAL_TABLET | Freq: Every day | ORAL | 0 refills | Status: DC
  Filled 2022-03-29: qty 90, 90d supply, fill #0

## 2022-03-29 MED ORDER — METOPROLOL TARTRATE 25 MG PO TABS
25.0000 mg | ORAL_TABLET | Freq: Two times a day (BID) | ORAL | 0 refills | Status: DC
  Filled 2022-03-29: qty 90, 45d supply, fill #0

## 2022-03-29 MED ORDER — FLUOXETINE HCL 40 MG PO CAPS
40.0000 mg | ORAL_CAPSULE | Freq: Every day | ORAL | 0 refills | Status: DC
  Filled 2022-03-29 – 2022-05-10 (×2): qty 90, 90d supply, fill #0
  Filled 2022-05-19: qty 30, 30d supply, fill #0
  Filled 2022-06-22: qty 30, 30d supply, fill #1
  Filled 2022-07-19 (×2): qty 30, 30d supply, fill #2

## 2022-03-31 ENCOUNTER — Encounter: Payer: Self-pay | Admitting: Family Medicine

## 2022-03-31 NOTE — Progress Notes (Signed)
Established Patient Office Visit  Subjective    Patient ID: Eric Keller, male    DOB: 12-Jun-1986  Age: 36 y.o. MRN: [id number]  CC: No chief complaint on file.   HPI Chaddrick Brue presents for routine follow up of chronic med issues including hypertension. Patient denies acute complaints or concerns.    Outpatient Encounter Medications as of 03/29/2022  Medication Sig   FLUoxetine (PROZAC) 40 MG capsule Take 1 capsule (40 mg total) by mouth daily.   lisinopril (ZESTRIL) 20 MG tablet Take 1 tablet (20 mg total) by mouth daily.   gabapentin (NEURONTIN) 400 MG capsule Take 1 capsule (400 mg total) by mouth 3 (three) times daily.   hydrOXYzine (ATARAX) 25 MG tablet Take 1 tablet (25 mg total) by mouth 3 (three) times daily as needed for anxiety.   metoprolol tartrate (LOPRESSOR) 25 MG tablet Take 1 tablet (25 mg total) by mouth 2 (two) times daily.   Multiple Vitamin (MULTIVITAMIN WITH MINERALS) TABS tablet Take 1 tablet by mouth daily.   QUEtiapine (SEROQUEL) 100 MG tablet Take 1 tablet (100 mg total) by mouth at bedtime.   thiamine 100 MG tablet Take 1 tablet (100 mg total) by mouth daily.   [DISCONTINUED] escitalopram (LEXAPRO) 20 MG tablet Take 1 tablet (20 mg total) by mouth daily.   [DISCONTINUED] gabapentin (NEURONTIN) 400 MG capsule Take 1 capsule (400 mg total) by mouth 3 (three) times daily.   [DISCONTINUED] lisinopril (ZESTRIL) 10 MG tablet Take 1 tablet (10 mg total) by mouth daily.   [DISCONTINUED] lisinopril (ZESTRIL) 10 MG tablet Take 1 tablet (10 mg total) by mouth daily.   [DISCONTINUED] metoprolol tartrate (LOPRESSOR) 25 MG tablet Take 1 tablet (25 mg total) by mouth 2 (two) times daily.   [DISCONTINUED] QUEtiapine (SEROQUEL) 100 MG tablet Take 1 tablet (100 mg total) by mouth at bedtime.   No facility-administered encounter medications on file as of 03/29/2022.    Past Medical History:  Diagnosis Date   Alcohol withdrawal ([place]) 07/20/2019   Anxiety    Anxiety     Depression    H/O: substance abuse ([place])    Hypertension     Past Surgical History:  Procedure Laterality Date   WISDOM TOOTH EXTRACTION      Family History  Problem Relation Age of Onset   Cancer Mother    Hypertension Paternal Grandfather    Heart disease Paternal Grandfather        MIs   Heart disease Other    Stroke Other     Social History   Socioeconomic History   Marital status: Single    Spouse name: Not on file   Number of children: 0   Years of education: Not on file   Highest education level: Not on file  Occupational History   Occupation: Cabin crew  Tobacco Use   Smoking status: Never    Passive exposure: Past   Smokeless tobacco: Never  Vaping Use   Vaping Use: Never used  Substance and Sexual Activity   Alcohol use: Not Currently   Drug use: Not Currently    Types: Marijuana    Comment: Vicodin Abuse, BEnzos   Sexual activity: Not Currently  Other Topics Concern   Not on file  Social History Narrative   Not on file   Social Determinants of Health   Financial Resource Strain: Not on file  Food Insecurity: Not on file  Transportation Needs: Not on file  Physical Activity: Not on file  Stress: Not  on file  Social Connections: Not on file  Intimate Partner Violence: Not on file    Review of Systems  All other systems reviewed and are negative.       Objective    BP (!) 139/90   Pulse 73   Temp 97.7 F (36.5 C) (Oral)   Resp 16   Wt 222 lb 3.2 oz (100.8 kg)   SpO2 98%   BMI 28.53 kg/m   Physical Exam Vitals and nursing note reviewed.  Constitutional:      General: He is not in acute distress. Cardiovascular:     Rate and Rhythm: Normal rate and regular rhythm.  Pulmonary:     Effort: Pulmonary effort is normal.     Breath sounds: Normal breath sounds.  Abdominal:     Palpations: Abdomen is soft.     Tenderness: There is no abdominal tenderness.  Musculoskeletal:     Right lower leg: No edema.  Neurological:      General: No focal deficit present.     Mental Status: He is alert and oriented to person, place, and time.  Psychiatric:        Mood and Affect: Mood normal.        Behavior: Behavior normal.         Assessment & Plan:   1. Essential hypertension, benign Continue present management. Meds refilled.   2. Anxiety Appears stable. Continue and monitor  3. Insomnia, unspecified type Continue. monitor  Return in about 3 months (around 06/29/2022) for follow up.   [name], MD

## 2022-04-02 ENCOUNTER — Other Ambulatory Visit: Payer: Self-pay

## 2022-04-09 ENCOUNTER — Other Ambulatory Visit: Payer: Self-pay

## 2022-04-16 ENCOUNTER — Other Ambulatory Visit: Payer: Self-pay

## 2022-05-10 ENCOUNTER — Other Ambulatory Visit: Payer: Self-pay

## 2022-05-10 ENCOUNTER — Other Ambulatory Visit: Payer: Self-pay | Admitting: Family Medicine

## 2022-05-10 MED ORDER — METOPROLOL TARTRATE 25 MG PO TABS
25.0000 mg | ORAL_TABLET | Freq: Two times a day (BID) | ORAL | 0 refills | Status: DC
  Filled 2022-05-10: qty 90, 45d supply, fill #0

## 2022-05-14 ENCOUNTER — Other Ambulatory Visit: Payer: Self-pay

## 2022-05-19 ENCOUNTER — Other Ambulatory Visit: Payer: Self-pay

## 2022-06-04 ENCOUNTER — Other Ambulatory Visit: Payer: Self-pay

## 2022-06-22 ENCOUNTER — Other Ambulatory Visit: Payer: Self-pay

## 2022-06-22 ENCOUNTER — Other Ambulatory Visit: Payer: Self-pay | Admitting: Family Medicine

## 2022-06-22 MED ORDER — METOPROLOL TARTRATE 25 MG PO TABS
25.0000 mg | ORAL_TABLET | Freq: Two times a day (BID) | ORAL | 2 refills | Status: DC
  Filled 2022-06-22: qty 60, 30d supply, fill #0
  Filled 2022-07-19 (×2): qty 60, 30d supply, fill #1

## 2022-06-22 MED ORDER — GABAPENTIN 400 MG PO CAPS
400.0000 mg | ORAL_CAPSULE | Freq: Three times a day (TID) | ORAL | 2 refills | Status: DC
  Filled 2022-06-22 – 2022-07-02 (×2): qty 90, 30d supply, fill #0

## 2022-06-22 MED ORDER — LISINOPRIL 20 MG PO TABS
20.0000 mg | ORAL_TABLET | Freq: Every day | ORAL | 2 refills | Status: DC
  Filled 2022-06-22 – 2022-06-23 (×2): qty 30, 30d supply, fill #0
  Filled 2022-07-19 (×2): qty 30, 30d supply, fill #1

## 2022-06-22 NOTE — Telephone Encounter (Signed)
Order complete. 

## 2022-06-23 ENCOUNTER — Other Ambulatory Visit: Payer: Self-pay

## 2022-06-30 ENCOUNTER — Ambulatory Visit: Payer: Medicaid Other | Admitting: Family Medicine

## 2022-07-02 ENCOUNTER — Other Ambulatory Visit: Payer: Self-pay

## 2022-07-02 MED ORDER — AMOXICILLIN 500 MG PO CAPS
500.0000 mg | ORAL_CAPSULE | Freq: Three times a day (TID) | ORAL | 0 refills | Status: DC
  Filled 2022-07-02: qty 21, 7d supply, fill #0

## 2022-07-19 ENCOUNTER — Other Ambulatory Visit: Payer: Self-pay | Admitting: Family Medicine

## 2022-07-19 ENCOUNTER — Other Ambulatory Visit: Payer: Self-pay

## 2022-07-21 ENCOUNTER — Other Ambulatory Visit: Payer: Self-pay

## 2022-07-21 MED ORDER — QUETIAPINE FUMARATE 100 MG PO TABS
100.0000 mg | ORAL_TABLET | Freq: Every day | ORAL | 0 refills | Status: DC
  Filled 2022-07-21: qty 90, 90d supply, fill #0

## 2022-07-22 ENCOUNTER — Other Ambulatory Visit: Payer: Self-pay

## 2022-07-22 ENCOUNTER — Ambulatory Visit (INDEPENDENT_AMBULATORY_CARE_PROVIDER_SITE_OTHER): Payer: Medicaid Other | Admitting: Family Medicine

## 2022-07-22 VITALS — BP 135/84 | HR 54 | Temp 97.7°F | Resp 16 | Wt 216.2 lb

## 2022-07-22 DIAGNOSIS — I10 Essential (primary) hypertension: Secondary | ICD-10-CM | POA: Diagnosis not present

## 2022-07-22 DIAGNOSIS — F419 Anxiety disorder, unspecified: Secondary | ICD-10-CM | POA: Diagnosis not present

## 2022-07-22 DIAGNOSIS — N529 Male erectile dysfunction, unspecified: Secondary | ICD-10-CM

## 2022-07-22 DIAGNOSIS — G8929 Other chronic pain: Secondary | ICD-10-CM

## 2022-07-22 DIAGNOSIS — F32A Depression, unspecified: Secondary | ICD-10-CM | POA: Diagnosis not present

## 2022-07-22 DIAGNOSIS — G4700 Insomnia, unspecified: Secondary | ICD-10-CM

## 2022-07-22 DIAGNOSIS — K089 Disorder of teeth and supporting structures, unspecified: Secondary | ICD-10-CM

## 2022-07-22 MED ORDER — QUETIAPINE FUMARATE 100 MG PO TABS
100.0000 mg | ORAL_TABLET | Freq: Every day | ORAL | 1 refills | Status: DC
  Filled 2022-11-04: qty 90, 90d supply, fill #0
  Filled 2022-11-05: qty 30, 30d supply, fill #0
  Filled 2022-12-02: qty 30, 30d supply, fill #1
  Filled 2022-12-28: qty 30, 30d supply, fill #2

## 2022-07-22 MED ORDER — AMOXICILLIN 500 MG PO CAPS
500.0000 mg | ORAL_CAPSULE | Freq: Three times a day (TID) | ORAL | 0 refills | Status: DC
  Filled 2022-07-22: qty 21, 7d supply, fill #0

## 2022-07-22 MED ORDER — FLUOXETINE HCL 40 MG PO CAPS
40.0000 mg | ORAL_CAPSULE | Freq: Every day | ORAL | 1 refills | Status: DC
  Filled 2022-07-23: qty 30, 30d supply, fill #0
  Filled 2022-08-24: qty 30, 30d supply, fill #1
  Filled 2022-09-23 – 2022-10-16 (×2): qty 30, 30d supply, fill #2
  Filled 2022-12-02: qty 30, 30d supply, fill #3

## 2022-07-22 MED ORDER — METOPROLOL TARTRATE 25 MG PO TABS
25.0000 mg | ORAL_TABLET | Freq: Two times a day (BID) | ORAL | 1 refills | Status: DC
  Filled 2022-08-24 – 2022-09-03 (×2): qty 180, 90d supply, fill #0
  Filled 2022-12-02 (×2): qty 180, 90d supply, fill #1

## 2022-07-22 MED ORDER — GABAPENTIN 400 MG PO CAPS
400.0000 mg | ORAL_CAPSULE | Freq: Three times a day (TID) | ORAL | 1 refills | Status: DC
  Filled 2022-07-22 – 2022-07-28 (×2): qty 90, 30d supply, fill #0
  Filled 2022-08-24: qty 90, 30d supply, fill #1

## 2022-07-22 MED ORDER — LISINOPRIL 20 MG PO TABS
20.0000 mg | ORAL_TABLET | Freq: Every day | ORAL | 1 refills | Status: DC
  Filled 2022-07-28: qty 90, 90d supply, fill #0
  Filled 2022-07-28: qty 30, 30d supply, fill #0
  Filled 2022-08-24: qty 30, 30d supply, fill #1
  Filled 2022-09-23: qty 30, 30d supply, fill #2
  Filled 2022-10-16: qty 30, 30d supply, fill #3
  Filled 2022-11-16: qty 30, 30d supply, fill #4
  Filled 2022-12-14 – 2022-12-15 (×2): qty 30, 30d supply, fill #5

## 2022-07-22 MED ORDER — SILDENAFIL CITRATE 100 MG PO TABS
100.0000 mg | ORAL_TABLET | Freq: Every day | ORAL | 0 refills | Status: DC | PRN
  Filled 2022-07-22: qty 10, 30d supply, fill #0
  Filled 2022-08-24 – 2022-09-03 (×3): qty 10, 30d supply, fill #1
  Filled 2022-09-23 – 2022-10-16 (×2): qty 10, 30d supply, fill #2

## 2022-07-23 ENCOUNTER — Other Ambulatory Visit: Payer: Self-pay

## 2022-07-23 ENCOUNTER — Encounter: Payer: Self-pay | Admitting: Family Medicine

## 2022-07-23 NOTE — Progress Notes (Signed)
Established Patient Office Visit  Subjective    Patient ID: Eric Keller, male    DOB: 1985-07-28  Age: 37 y.o. MRN: [id number]  CC:  Chief Complaint  Patient presents with   Follow-up    HPI Eric Keller presents for routine follow up of chronic med issues. He also reports that he has been having some ED lately and would like to try viagra. Patient also reports that he is to have a dental procedure and may need a refill of the abx as the sx resolved but now are recurring before the procedure can be completed.    Outpatient Encounter Medications as of 07/22/2022  Medication Sig   hydrOXYzine (ATARAX) 25 MG tablet Take 1 tablet (25 mg total) by mouth 3 (three) times daily as needed for anxiety.   Multiple Vitamin (MULTIVITAMIN WITH MINERALS) TABS tablet Take 1 tablet by mouth daily.   sildenafil (VIAGRA) 100 MG tablet Take 1 tablet (100 mg total) by mouth daily as needed for erectile dysfunction.   thiamine 100 MG tablet Take 1 tablet (100 mg total) by mouth daily.   [DISCONTINUED] amoxicillin (AMOXIL) 500 MG capsule Take 1 capsule (500 mg total) by mouth 3 (three) times daily.   [DISCONTINUED] FLUoxetine (PROZAC) 40 MG capsule Take 1 capsule (40 mg total) by mouth daily.   [DISCONTINUED] gabapentin (NEURONTIN) 400 MG capsule Take 1 capsule (400 mg total) by mouth 3 (three) times daily.   [DISCONTINUED] lisinopril (ZESTRIL) 20 MG tablet Take 1 tablet (20 mg total) by mouth daily.   [DISCONTINUED] metoprolol tartrate (LOPRESSOR) 25 MG tablet Take 1 tablet (25 mg total) by mouth 2 (two) times daily.   [DISCONTINUED] QUEtiapine (SEROQUEL) 100 MG tablet Take 1 tablet (100 mg total) by mouth at bedtime.   amoxicillin (AMOXIL) 500 MG capsule Take 1 capsule (500 mg total) by mouth 3 (three) times daily.   FLUoxetine (PROZAC) 40 MG capsule Take 1 capsule (40 mg total) by mouth daily.   gabapentin (NEURONTIN) 400 MG capsule Take 1 capsule (400 mg total) by mouth 3 (three) times daily.    lisinopril (ZESTRIL) 20 MG tablet Take 1 tablet (20 mg total) by mouth daily.   metoprolol tartrate (LOPRESSOR) 25 MG tablet Take 1 tablet (25 mg total) by mouth 2 (two) times daily.   QUEtiapine (SEROQUEL) 100 MG tablet Take 1 tablet (100 mg total) by mouth at bedtime.   No facility-administered encounter medications on file as of 07/22/2022.    Past Medical History:  Diagnosis Date   Alcohol withdrawal ([place]) 07/20/2019   Anxiety    Anxiety    Depression    H/O: substance abuse ([place])    Hypertension     Past Surgical History:  Procedure Laterality Date   WISDOM TOOTH EXTRACTION      Family History  Problem Relation Age of Onset   Cancer Mother    Hypertension Paternal Grandfather    Heart disease Paternal Grandfather        MIs   Heart disease Other    Stroke Other     Social History   Socioeconomic History   Marital status: Single    Spouse name: Not on file   Number of children: 0   Years of education: Not on file   Highest education level: Not on file  Occupational History   Occupation: Cabin crew  Tobacco Use   Smoking status: Never    Passive exposure: Past   Smokeless tobacco: Never  Vaping Use   Vaping  Use: Never used  Substance and Sexual Activity   Alcohol use: Not Currently   Drug use: Not Currently    Types: Marijuana    Comment: Vicodin Abuse, BEnzos   Sexual activity: Not Currently  Other Topics Concern   Not on file  Social History Narrative   Not on file   Social Determinants of Health   Financial Resource Strain: Not on file  Food Insecurity: Not on file  Transportation Needs: Not on file  Physical Activity: Not on file  Stress: Not on file  Social Connections: Not on file  Intimate Partner Violence: Not on file    Review of Systems  All other systems reviewed and are negative.       Objective    BP 135/84   Pulse (!) 54   Temp 97.7 F (36.5 C) (Oral)   Resp 16   Wt 216 lb 3.2 oz (98.1 kg)   SpO2 97%   BMI 27.76  kg/m   Physical Exam Vitals and nursing note reviewed.  Constitutional:      General: He is not in acute distress. HENT:     Mouth/Throat:     Dentition: Dental tenderness and dental abscesses present.  Cardiovascular:     Rate and Rhythm: Normal rate and regular rhythm.  Pulmonary:     Effort: Pulmonary effort is normal.     Breath sounds: Normal breath sounds.  Abdominal:     Palpations: Abdomen is soft.     Tenderness: There is no abdominal tenderness.  Neurological:     General: No focal deficit present.     Mental Status: He is alert and oriented to person, place, and time.  Psychiatric:        Mood and Affect: Mood normal.        Behavior: Behavior normal.         Assessment & Plan:   1. Essential hypertension, benign Appears stable. Continue. Meds refilled.   2. Anxiety and depression Prozac 40 mg refilled.   3. Insomnia, unspecified type Seroquel refilled.   4. Chronic dental pain Amox prescribed.   5. Erectile dysfunction, unspecified erectile dysfunction type Viagra prescribed.   Return in about 6 months (around 01/20/2023) for follow up.   Becky Sax, MD

## 2022-07-28 ENCOUNTER — Other Ambulatory Visit: Payer: Self-pay

## 2022-08-02 ENCOUNTER — Other Ambulatory Visit: Payer: Self-pay

## 2022-08-02 MED ORDER — AMOXICILLIN 500 MG PO CAPS
500.0000 mg | ORAL_CAPSULE | Freq: Three times a day (TID) | ORAL | 0 refills | Status: DC
  Filled 2022-08-02: qty 21, 7d supply, fill #0

## 2022-08-02 MED ORDER — IBUPROFEN 800 MG PO TABS
800.0000 mg | ORAL_TABLET | ORAL | 0 refills | Status: DC
  Filled 2022-08-02: qty 28, 7d supply, fill #0

## 2022-08-24 ENCOUNTER — Other Ambulatory Visit: Payer: Self-pay

## 2022-08-31 ENCOUNTER — Other Ambulatory Visit: Payer: Self-pay

## 2022-09-03 ENCOUNTER — Other Ambulatory Visit: Payer: Self-pay

## 2022-09-23 ENCOUNTER — Other Ambulatory Visit: Payer: Self-pay | Admitting: Family Medicine

## 2022-09-23 ENCOUNTER — Other Ambulatory Visit: Payer: Self-pay

## 2022-09-23 MED ORDER — GABAPENTIN 400 MG PO CAPS
400.0000 mg | ORAL_CAPSULE | Freq: Three times a day (TID) | ORAL | 1 refills | Status: DC
  Filled 2022-09-23: qty 90, 30d supply, fill #0
  Filled 2022-10-16: qty 90, 30d supply, fill #1

## 2022-09-24 ENCOUNTER — Other Ambulatory Visit: Payer: Self-pay

## 2022-09-30 ENCOUNTER — Other Ambulatory Visit: Payer: Self-pay

## 2022-10-16 ENCOUNTER — Other Ambulatory Visit: Payer: Self-pay

## 2022-10-18 ENCOUNTER — Other Ambulatory Visit: Payer: Self-pay

## 2022-11-04 ENCOUNTER — Other Ambulatory Visit: Payer: Self-pay

## 2022-11-05 ENCOUNTER — Other Ambulatory Visit (HOSPITAL_COMMUNITY): Payer: Self-pay

## 2022-11-05 ENCOUNTER — Other Ambulatory Visit: Payer: Self-pay

## 2022-11-05 ENCOUNTER — Other Ambulatory Visit (HOSPITAL_BASED_OUTPATIENT_CLINIC_OR_DEPARTMENT_OTHER): Payer: Self-pay

## 2022-11-16 ENCOUNTER — Other Ambulatory Visit: Payer: Self-pay

## 2022-11-16 ENCOUNTER — Other Ambulatory Visit: Payer: Self-pay | Admitting: Family Medicine

## 2022-11-19 ENCOUNTER — Other Ambulatory Visit: Payer: Self-pay

## 2022-11-19 MED ORDER — GABAPENTIN 400 MG PO CAPS
400.0000 mg | ORAL_CAPSULE | Freq: Three times a day (TID) | ORAL | 1 refills | Status: DC
  Filled 2022-11-19: qty 90, 30d supply, fill #0
  Filled 2022-12-14: qty 90, 30d supply, fill #1

## 2022-11-19 MED ORDER — SILDENAFIL CITRATE 100 MG PO TABS
100.0000 mg | ORAL_TABLET | Freq: Every day | ORAL | 0 refills | Status: DC | PRN
  Filled 2022-11-19: qty 10, 10d supply, fill #0
  Filled 2022-12-14: qty 10, 10d supply, fill #1
  Filled 2023-07-11: qty 10, 30d supply, fill #2

## 2022-11-23 ENCOUNTER — Other Ambulatory Visit: Payer: Self-pay

## 2022-12-02 ENCOUNTER — Other Ambulatory Visit: Payer: Self-pay

## 2022-12-14 ENCOUNTER — Other Ambulatory Visit: Payer: Self-pay

## 2022-12-15 ENCOUNTER — Other Ambulatory Visit: Payer: Self-pay

## 2022-12-17 ENCOUNTER — Other Ambulatory Visit: Payer: Self-pay

## 2022-12-29 ENCOUNTER — Other Ambulatory Visit: Payer: Self-pay

## 2022-12-30 ENCOUNTER — Other Ambulatory Visit: Payer: Self-pay | Admitting: Family Medicine

## 2022-12-31 ENCOUNTER — Other Ambulatory Visit: Payer: Self-pay

## 2022-12-31 NOTE — Telephone Encounter (Signed)
Requested medication (s) are due for refill today:yes  Requested medication (s) are on the active medication list:yes  Last refill:  11/19/22 #90 1 RF  Future visit scheduled: yes  Notes to clinic:  overdue lab work    Requested Prescriptions  Pending Prescriptions Disp Refills   gabapentin (NEURONTIN) 400 MG capsule 90 capsule 1    Sig: Take 1 capsule (400 mg total) by mouth 3 (three) times daily.     Neurology: Anticonvulsants - gabapentin Failed - 12/30/2022  5:10 PM      Failed - Cr in normal range and within 360 days    Creatinine  Date Value Ref Range Status  03/01/2012 1.12 0.60 - 1.30 mg/dL Final   Creatinine, Ser  Date Value Ref Range Status  09/02/2021 1.41 (H) 0.61 - 1.24 mg/dL Final         Passed - Completed PHQ-2 or PHQ-9 in the last 360 days      Passed - Valid encounter within last 12 months    Recent Outpatient Visits           5 months ago Essential hypertension, benign   [place] [facility] at [facility], MD   9 months ago Essential hypertension, benign   [place] [facility] at [facility], MD   10 months ago Essential hypertension, benign   [place] [facility] at [facility], MD   1 year ago Essential hypertension, benign   [place] Primary Care at [facility], MD   1 year ago Insomnia, unspecified type   [facility] at [facility], Gildardo Pounds, NP       Future Appointments             In 2 weeks Georganna Skeans, MD [facility] at [facility]

## 2023-01-04 ENCOUNTER — Other Ambulatory Visit: Payer: Self-pay | Admitting: Family Medicine

## 2023-01-05 ENCOUNTER — Other Ambulatory Visit: Payer: Self-pay

## 2023-01-05 MED ORDER — GABAPENTIN 400 MG PO CAPS
400.0000 mg | ORAL_CAPSULE | Freq: Three times a day (TID) | ORAL | 1 refills | Status: DC
  Filled 2023-01-05 – 2023-01-11 (×4): qty 90, 30d supply, fill #0
  Filled 2023-02-07: qty 90, 30d supply, fill #1

## 2023-01-07 ENCOUNTER — Other Ambulatory Visit: Payer: Self-pay

## 2023-01-10 ENCOUNTER — Other Ambulatory Visit: Payer: Self-pay

## 2023-01-11 ENCOUNTER — Other Ambulatory Visit: Payer: Self-pay

## 2023-01-11 ENCOUNTER — Other Ambulatory Visit: Payer: Self-pay | Admitting: Family Medicine

## 2023-01-11 MED ORDER — LISINOPRIL 20 MG PO TABS
20.0000 mg | ORAL_TABLET | Freq: Every day | ORAL | 1 refills | Status: DC
  Filled 2023-01-11: qty 90, 90d supply, fill #0
  Filled 2023-04-19: qty 90, 90d supply, fill #1

## 2023-01-20 ENCOUNTER — Encounter: Payer: Self-pay | Admitting: Family Medicine

## 2023-01-20 ENCOUNTER — Other Ambulatory Visit: Payer: Self-pay

## 2023-01-20 ENCOUNTER — Ambulatory Visit: Payer: MEDICAID | Admitting: Family Medicine

## 2023-01-20 VITALS — BP 136/78 | HR 60 | Temp 98.1°F | Resp 16 | Wt 230.0 lb

## 2023-01-20 DIAGNOSIS — I10 Essential (primary) hypertension: Secondary | ICD-10-CM

## 2023-01-20 DIAGNOSIS — G4700 Insomnia, unspecified: Secondary | ICD-10-CM

## 2023-01-20 DIAGNOSIS — F32A Depression, unspecified: Secondary | ICD-10-CM

## 2023-01-20 DIAGNOSIS — N529 Male erectile dysfunction, unspecified: Secondary | ICD-10-CM

## 2023-01-20 DIAGNOSIS — F419 Anxiety disorder, unspecified: Secondary | ICD-10-CM | POA: Diagnosis not present

## 2023-01-20 MED ORDER — TADALAFIL 5 MG PO TABS
5.0000 mg | ORAL_TABLET | Freq: Every day | ORAL | 5 refills | Status: DC
  Filled 2023-01-20 – 2023-01-24 (×3): qty 30, 30d supply, fill #0
  Filled 2023-02-18 – 2023-03-03 (×2): qty 30, 30d supply, fill #1
  Filled 2023-04-01: qty 30, 30d supply, fill #2
  Filled 2023-05-17: qty 30, 30d supply, fill #3
  Filled 2023-06-14 – 2023-07-12 (×3): qty 30, 30d supply, fill #4
  Filled 2023-09-08: qty 30, 30d supply, fill #5
  Filled 2023-09-08: qty 10, 10d supply, fill #5
  Filled 2023-09-20 – 2023-09-21 (×2): qty 10, 10d supply, fill #6
  Filled 2023-10-17: qty 10, 10d supply, fill #7

## 2023-01-20 MED ORDER — FLUOXETINE HCL 40 MG PO CAPS
40.0000 mg | ORAL_CAPSULE | Freq: Every day | ORAL | 1 refills | Status: DC
  Filled 2023-01-20: qty 90, 90d supply, fill #0
  Filled 2023-02-07: qty 30, 30d supply, fill #0
  Filled 2023-03-18: qty 30, 30d supply, fill #1
  Filled 2023-04-25: qty 30, 30d supply, fill #2
  Filled 2023-06-14 (×2): qty 30, 30d supply, fill #3
  Filled 2023-08-04 – 2023-08-22 (×2): qty 30, 30d supply, fill #4
  Filled 2023-10-04: qty 30, 30d supply, fill #5

## 2023-01-20 MED ORDER — QUETIAPINE FUMARATE 100 MG PO TABS
100.0000 mg | ORAL_TABLET | Freq: Every day | ORAL | 5 refills | Status: DC
  Filled 2023-01-20 – 2023-01-24 (×2): qty 30, 30d supply, fill #0
  Filled 2023-02-17: qty 30, 30d supply, fill #1
  Filled 2023-03-10: qty 30, 30d supply, fill #2
  Filled 2023-04-19: qty 30, 30d supply, fill #3
  Filled 2023-05-17: qty 30, 30d supply, fill #4
  Filled 2023-06-14 (×2): qty 30, 30d supply, fill #5

## 2023-01-20 NOTE — Progress Notes (Signed)
Patient is here for their 6 month follow-up Patient has no concerns today Care gaps have been discussed with patient  

## 2023-01-20 NOTE — Progress Notes (Signed)
Established Patient Office Visit  Subjective    Patient ID: Eric Keller, male    DOB: 1985/11/24  Age: 37 y.o. MRN: [id number]  CC: No chief complaint on file.   HPI Eric Keller presents for routine follow up of chronic med issues. Patient denies acute complaints or concerns,.   Outpatient Encounter Medications as of 01/20/2023  Medication Sig   amoxicillin (AMOXIL) 500 MG capsule Take 1 capsule (500 mg total) by mouth 3 (three) times daily for 7 days.   FLUoxetine (PROZAC) 40 MG capsule Take 1 capsule (40 mg total) by mouth daily.   gabapentin (NEURONTIN) 400 MG capsule Take 1 capsule (400 mg total) by mouth 3 (three) times daily.   hydrOXYzine (ATARAX) 25 MG tablet Take 1 tablet (25 mg total) by mouth 3 (three) times daily as needed for anxiety.   ibuprofen (ADVIL) 800 MG tablet Take 1 tablet (800 mg total) by mouth every 6-8 hours as needed.   lisinopril (ZESTRIL) 20 MG tablet Take 1 tablet (20 mg total) by mouth daily.   Multiple Vitamin (MULTIVITAMIN WITH MINERALS) TABS tablet Take 1 tablet by mouth daily.   sildenafil (VIAGRA) 100 MG tablet Take 1 tablet (100 mg total) by mouth daily as needed for erectile dysfunction.   thiamine 100 MG tablet Take 1 tablet (100 mg total) by mouth daily.   [DISCONTINUED] metoprolol tartrate (LOPRESSOR) 25 MG tablet Take 1 tablet (25 mg total) by mouth 2 (two) times daily.   [DISCONTINUED] QUEtiapine (SEROQUEL) 100 MG tablet Take 1 tablet (100 mg total) by mouth at bedtime.   No facility-administered encounter medications on file as of 01/20/2023.    Past Medical History:  Diagnosis Date   Alcohol withdrawal (HCC) 07/20/2019   Anxiety    Anxiety    Depression    H/O: substance abuse (HCC)    Hypertension     Past Surgical History:  Procedure Laterality Date   WISDOM TOOTH EXTRACTION      Family History  Problem Relation Age of Onset   Cancer Mother    Hypertension Paternal Grandfather    Heart disease Paternal Grandfather         MIs   Heart disease Other    Stroke Other     Social History   Socioeconomic History   Marital status: Single    Spouse name: Not on file   Number of children: 0   Years of education: Not on file   Highest education level: Not on file  Occupational History   Occupation: Journalist, newspaper  Tobacco Use   Smoking status: Never    Passive exposure: Past   Smokeless tobacco: Never  Vaping Use   Vaping status: Never Used  Substance and Sexual Activity   Alcohol use: Not Currently   Drug use: Not Currently    Types: Marijuana    Comment: Vicodin Abuse, BEnzos   Sexual activity: Not Currently  Other Topics Concern   Not on file  Social History Narrative   Not on file   Social Determinants of Health   Financial Resource Strain: Not on file  Food Insecurity: Not on file  Transportation Needs: Not on file  Physical Activity: Not on file  Stress: Not on file  Social Connections: Not on file  Intimate Partner Violence: Not on file    Review of Systems  All other systems reviewed and are negative.       Objective    BP 136/78   Pulse 60   Temp  98.1 F (36.7 C) (Oral)   Resp 16   Wt 230 lb (104.3 kg)   SpO2 97%   BMI 29.53 kg/m   Physical Exam Vitals and nursing note reviewed.  Constitutional:      General: He is not in acute distress. Cardiovascular:     Rate and Rhythm: Normal rate and regular rhythm.  Pulmonary:     Effort: Pulmonary effort is normal.     Breath sounds: Normal breath sounds.  Abdominal:     Palpations: Abdomen is soft.     Tenderness: There is no abdominal tenderness.  Neurological:     General: No focal deficit present.     Mental Status: He is alert and oriented to person, place, and time.  Psychiatric:        Mood and Affect: Mood normal.        Behavior: Behavior normal.         Assessment & Plan:   1. Essential hypertension, benign Appears stable. Continue   2. Anxiety and depression Appears stable. Continue. Meds  refilled.   3. Insomnia, unspecified type Seroquel prescribed  4. Erectile dysfunction, unspecified erectile dysfunction type Patient desires to switch from viagra to daily cialis. 5mg  cialis daily prescribed.     No follow-ups on file.   [name], MD

## 2023-01-21 ENCOUNTER — Other Ambulatory Visit: Payer: Self-pay

## 2023-01-21 ENCOUNTER — Other Ambulatory Visit (HOSPITAL_COMMUNITY): Payer: Self-pay

## 2023-01-24 ENCOUNTER — Other Ambulatory Visit (HOSPITAL_COMMUNITY): Payer: Self-pay

## 2023-01-24 ENCOUNTER — Other Ambulatory Visit: Payer: Self-pay

## 2023-01-27 ENCOUNTER — Other Ambulatory Visit: Payer: Self-pay

## 2023-02-07 ENCOUNTER — Other Ambulatory Visit: Payer: Self-pay

## 2023-02-17 ENCOUNTER — Other Ambulatory Visit: Payer: Self-pay

## 2023-02-18 ENCOUNTER — Other Ambulatory Visit: Payer: Self-pay

## 2023-02-24 ENCOUNTER — Other Ambulatory Visit: Payer: Self-pay

## 2023-03-03 ENCOUNTER — Other Ambulatory Visit: Payer: Self-pay

## 2023-03-07 ENCOUNTER — Other Ambulatory Visit: Payer: Self-pay | Admitting: Family Medicine

## 2023-03-07 ENCOUNTER — Other Ambulatory Visit: Payer: Self-pay

## 2023-03-07 MED ORDER — GABAPENTIN 400 MG PO CAPS
400.0000 mg | ORAL_CAPSULE | Freq: Three times a day (TID) | ORAL | 1 refills | Status: DC
  Filled 2023-03-07: qty 90, 30d supply, fill #0
  Filled 2023-04-01: qty 90, 30d supply, fill #1

## 2023-03-08 ENCOUNTER — Other Ambulatory Visit: Payer: Self-pay

## 2023-03-09 ENCOUNTER — Other Ambulatory Visit: Payer: Self-pay

## 2023-03-09 MED ORDER — AMOXICILLIN 500 MG PO CAPS
500.0000 mg | ORAL_CAPSULE | Freq: Three times a day (TID) | ORAL | 0 refills | Status: DC
  Filled 2023-03-09 – 2023-03-17 (×2): qty 25, 9d supply, fill #0

## 2023-03-09 MED ORDER — IBUPROFEN 600 MG PO TABS
600.0000 mg | ORAL_TABLET | ORAL | 0 refills | Status: DC
  Filled 2023-03-09: qty 15, 3d supply, fill #0

## 2023-03-10 ENCOUNTER — Other Ambulatory Visit (HOSPITAL_COMMUNITY): Payer: Self-pay

## 2023-03-10 ENCOUNTER — Other Ambulatory Visit: Payer: Self-pay | Admitting: Family Medicine

## 2023-03-10 ENCOUNTER — Other Ambulatory Visit: Payer: Self-pay

## 2023-03-10 MED ORDER — METOPROLOL TARTRATE 25 MG PO TABS
25.0000 mg | ORAL_TABLET | Freq: Two times a day (BID) | ORAL | 1 refills | Status: DC
  Filled 2023-03-10: qty 180, 90d supply, fill #0
  Filled 2023-05-30 – 2023-06-14 (×4): qty 180, 90d supply, fill #1

## 2023-03-11 ENCOUNTER — Other Ambulatory Visit (HOSPITAL_BASED_OUTPATIENT_CLINIC_OR_DEPARTMENT_OTHER): Payer: Self-pay

## 2023-03-16 ENCOUNTER — Other Ambulatory Visit: Payer: Self-pay

## 2023-03-17 ENCOUNTER — Other Ambulatory Visit: Payer: Self-pay

## 2023-03-18 ENCOUNTER — Other Ambulatory Visit: Payer: Self-pay

## 2023-04-01 ENCOUNTER — Other Ambulatory Visit: Payer: Self-pay

## 2023-04-19 ENCOUNTER — Other Ambulatory Visit: Payer: Self-pay | Admitting: Family Medicine

## 2023-04-19 ENCOUNTER — Other Ambulatory Visit: Payer: Self-pay

## 2023-04-20 ENCOUNTER — Other Ambulatory Visit: Payer: Self-pay

## 2023-04-25 ENCOUNTER — Other Ambulatory Visit: Payer: Self-pay

## 2023-04-25 ENCOUNTER — Other Ambulatory Visit: Payer: Self-pay | Admitting: Family Medicine

## 2023-04-26 ENCOUNTER — Other Ambulatory Visit: Payer: Self-pay

## 2023-04-27 ENCOUNTER — Other Ambulatory Visit: Payer: Self-pay

## 2023-04-27 ENCOUNTER — Other Ambulatory Visit: Payer: Self-pay | Admitting: Family Medicine

## 2023-04-27 NOTE — Telephone Encounter (Signed)
Medication Refill -  Most Recent Primary Care Visit:  Provider: Georganna Skeans  Department: [facility]  Visit Type: OFFICE VISIT  Date: 01/20/2023  Medication: gabapentin (NEURONTIN) 400 MG capsule   Pt is calling to f/u stated he is out of the medication and urgently needs it. Please advise.   Has the patient contacted their pharmacy? Yes No, more refills.   (Agent: If yes, when and what did the pharmacy advise?)  Is this the correct pharmacy for this prescription? Yes If no, delete pharmacy and type the correct one.  This is the patient's preferred pharmacy:  [facility] [address] [place] [address] Phone: [phone] Fax: [phone]   Has the prescription been filled recently? Yes  Is the patient out of the medication? No Since las week on Friday.   Has the patient been seen for an appointment in the last year OR does the patient have an upcoming appointment? Yes  Can we respond through MyChart? Yes  Agent: Please be advised that Rx refills may take up to 3 business days. We ask that you follow-up with your pharmacy.

## 2023-04-27 NOTE — Telephone Encounter (Signed)
Medication Refill -  Most Recent Primary Care Visit:  Provider: Georganna Skeans  Department: [facility]  Visit Type: OFFICE VISIT  Date: 01/20/2023  Medication: gabapentin (NEURONTIN) 400 MG capsule [[id number]]   Has the patient contacted their pharmacy? Yes  (Agent: If yes, when and what did the pharmacy advise?) Contact PCP   Is this the correct pharmacy for this prescription? Yes If no, delete pharmacy and type the correct one.   This is the patient's preferred pharmacy:  [facility] - [facility] [address] [place] [address] Phone: [phone] Fax: [phone]   Has the prescription been filled recently? Yes  Is the patient out of the medication? Yes  Has the patient been seen for an appointment in the last year OR does the patient have an upcoming appointment? Yes  Can we respond through MyChart? Yes  Agent: Please be advised that Rx refills may take up to 3 business days. We ask that you follow-up with your pharmacy.

## 2023-04-28 ENCOUNTER — Telehealth: Payer: Self-pay | Admitting: *Deleted

## 2023-04-28 ENCOUNTER — Other Ambulatory Visit: Payer: Self-pay | Admitting: Family Medicine

## 2023-04-28 ENCOUNTER — Other Ambulatory Visit: Payer: Self-pay

## 2023-04-28 MED ORDER — GABAPENTIN 400 MG PO CAPS
400.0000 mg | ORAL_CAPSULE | Freq: Three times a day (TID) | ORAL | 0 refills | Status: DC
  Filled 2023-04-28: qty 90, 30d supply, fill #0

## 2023-04-28 NOTE — Telephone Encounter (Signed)
Requested Prescriptions  Refused Prescriptions Disp Refills   gabapentin (NEURONTIN) 400 MG capsule 90 capsule 1    Sig: Take 1 capsule (400 mg total) by mouth 3 (three) times daily.     Neurology: Anticonvulsants - gabapentin Failed - 04/28/2023 11:05 AM      Failed - Cr in normal range and within 360 days    Creatinine  Date Value Ref Range Status  03/01/2012 1.12 0.60 - 1.30 mg/dL Final   Creatinine, Ser  Date Value Ref Range Status  09/02/2021 1.41 (H) 0.61 - 1.24 mg/dL Final         Failed - Completed PHQ-2 or PHQ-9 in the last 360 days      Passed - Valid encounter within last 12 months    Recent Outpatient Visits           3 months ago Essential hypertension, benign   [place] [facility], MD   9 months ago Essential hypertension, benign   [place] [facility], MD   1 year ago Essential hypertension, benign   [place] [facility] at [facility], MD   1 year ago Essential hypertension, benign   [place] [facility] at [facility], MD   1 year ago Essential hypertension, benign   [place] [facility] at [facility], MD       Future Appointments             In 2 months Georganna Skeans, MD [facility] at [facility]

## 2023-04-28 NOTE — Telephone Encounter (Signed)
Medication Refill -  Most Recent Primary Care Visit:  Provider: Georganna Skeans  Department: [facility]  Visit Type: OFFICE VISIT  Date: 01/20/2023  Medication: gabapentin (NEURONTIN) 400 MG capsule   Pharmacy calling to f/u stated many request have been sent. Please advise.  Has the patient contacted their pharmacy? Yes  (Agent: If yes, when and what did the pharmacy advise?)  Is this the correct pharmacy for this prescription? Yes If no, delete pharmacy and type the correct one.  This is the patient's preferred pharmacy:  [facility] [address] [place] [address] Phone: [phone] Fax: [phone]   Has the prescription been filled recently? Yes  Is the patient out of the medication? Yes  Has the patient been seen for an appointment in the last year OR does the patient have an upcoming appointment? Yes  Can we respond through MyChart? Yes  Agent: Please be advised that Rx refills may take up to 3 business days. We ask that you follow-up with your pharmacy.

## 2023-04-28 NOTE — Telephone Encounter (Signed)
Requested Prescriptions  Pending Prescriptions Disp Refills   gabapentin (NEURONTIN) 400 MG capsule 90 capsule 0    Sig: Take 1 capsule (400 mg total) by mouth 3 (three) times daily.     Neurology: Anticonvulsants - gabapentin Failed - 04/27/2023  2:57 PM      Failed - Cr in normal range and within 360 days    Creatinine  Date Value Ref Range Status  03/01/2012 1.12 0.60 - 1.30 mg/dL Final   Creatinine, Ser  Date Value Ref Range Status  09/02/2021 1.41 (H) 0.61 - 1.24 mg/dL Final         Failed - Completed PHQ-2 or PHQ-9 in the last 360 days      Passed - Valid encounter within last 12 months    Recent Outpatient Visits           3 months ago Essential hypertension, benign   [place] [facility] at [facility], MD   9 months ago Essential hypertension, benign   [place] [facility] at [facility], MD   1 year ago Essential hypertension, benign   [place] [facility] at [facility], MD   1 year ago Essential hypertension, benign   [place] [facility] at [facility], MD   1 year ago Essential hypertension, benign   [place] [facility] at [facility], MD       Future Appointments             In 2 months Georganna Skeans, MD [facility] at [facility]

## 2023-04-28 NOTE — Telephone Encounter (Signed)
Called [place] pharmacy to let them know CMA Jessie Foot will resend order for Gabapentin

## 2023-04-28 NOTE — Telephone Encounter (Signed)
Pt called for update on request, says that he has been completely out since Friday. Says he is waiting on the clinic. Says he is willing to come in for a paper order if needed.

## 2023-04-28 NOTE — Telephone Encounter (Signed)
Requested medication (s) are due for refill today: yes}  Requested medication (s) are on the active medication list: yes  Last refill:  03/07/23 #90 1 RF  Future visit scheduled: yes  Notes to clinic:  overdue lab work   Requested Prescriptions  Pending Prescriptions Disp Refills   gabapentin (NEURONTIN) 400 MG capsule 90 capsule 1    Sig: Take 1 capsule (400 mg total) by mouth 3 (three) times daily.     Neurology: Anticonvulsants - gabapentin Failed - 04/27/2023  1:16 PM      Failed - Cr in normal range and within 360 days    Creatinine  Date Value Ref Range Status  03/01/2012 1.12 0.60 - 1.30 mg/dL Final   Creatinine, Ser  Date Value Ref Range Status  09/02/2021 1.41 (H) 0.61 - 1.24 mg/dL Final         Failed - Completed PHQ-2 or PHQ-9 in the last 360 days      Passed - Valid encounter within last 12 months    Recent Outpatient Visits           3 months ago Essential hypertension, benign   [place] [facility], MD   9 months ago Essential hypertension, benign   [place] [facility] at [facility], MD   1 year ago Essential hypertension, benign   [place] [facility] at [facility], MD   1 year ago Essential hypertension, benign   [place] [facility] at [facility], MD   1 year ago Essential hypertension, benign   [place] [facility] at [facility], MD       Future Appointments             In 2 months Georganna Skeans, MD [facility] at [facility]

## 2023-05-18 ENCOUNTER — Other Ambulatory Visit: Payer: Self-pay

## 2023-05-30 ENCOUNTER — Other Ambulatory Visit: Payer: Self-pay

## 2023-05-30 ENCOUNTER — Other Ambulatory Visit: Payer: Self-pay | Admitting: Family Medicine

## 2023-06-01 ENCOUNTER — Other Ambulatory Visit: Payer: Self-pay

## 2023-06-01 MED ORDER — LISINOPRIL 20 MG PO TABS
20.0000 mg | ORAL_TABLET | Freq: Every day | ORAL | 1 refills | Status: DC
  Filled 2023-06-01 – 2023-07-11 (×2): qty 90, 90d supply, fill #0
  Filled 2023-09-20 – 2023-09-22 (×3): qty 90, 90d supply, fill #1

## 2023-06-01 MED ORDER — GABAPENTIN 400 MG PO CAPS
400.0000 mg | ORAL_CAPSULE | Freq: Three times a day (TID) | ORAL | 0 refills | Status: DC
  Filled 2023-06-01: qty 90, 30d supply, fill #0

## 2023-06-03 ENCOUNTER — Other Ambulatory Visit: Payer: Self-pay

## 2023-06-14 ENCOUNTER — Other Ambulatory Visit: Payer: Self-pay

## 2023-06-23 ENCOUNTER — Other Ambulatory Visit: Payer: Self-pay

## 2023-06-23 ENCOUNTER — Other Ambulatory Visit: Payer: Self-pay | Admitting: Family Medicine

## 2023-06-24 ENCOUNTER — Other Ambulatory Visit: Payer: Self-pay

## 2023-06-27 ENCOUNTER — Other Ambulatory Visit: Payer: Self-pay | Admitting: Family Medicine

## 2023-06-27 NOTE — Telephone Encounter (Unsigned)
 Copied from CRM [id number]. Topic: General - Other >> Jun 27, 2023 11:29 AM Everette C wrote: Reason for CRM: Medication Refill - Most Recent Primary Care Visit:  Provider: TANDA BLEACHER Department: [facility] Visit Type: OFFICE VISIT Date: 01/20/2023  Medication: Rx #: [id number]  gabapentin  (NEURONTIN ) 400 MG capsule [[id number]]     Has the patient contacted their pharmacy? Yes (Agent: If no, request that the patient contact the pharmacy for the refill. If patient does not wish to contact the pharmacy document the reason why and proceed with request.) (Agent: If yes, when and what did the pharmacy advise?)  Is this the correct pharmacy for this prescription? Yes If no, delete pharmacy and type the correct one.  This is the patient's preferred pharmacy:  [facility] - [facility] [address] [place] [address] Phone: [phone] Fax: [phone]   Has the prescription been filled recently? Yes  Is the patient out of the medication? Yes  Has the patient been seen for an appointment in the last year OR does the patient have an upcoming appointment? No  Can we respond through MyChart? Yes  Agent: Please be advised that Rx refills may take up to 3 business days. We ask that you follow-up with your pharmacy.

## 2023-06-28 ENCOUNTER — Other Ambulatory Visit: Payer: Self-pay

## 2023-06-28 ENCOUNTER — Other Ambulatory Visit: Payer: Self-pay | Admitting: Family Medicine

## 2023-06-28 MED ORDER — GABAPENTIN 400 MG PO CAPS: 400.0000 mg | ORAL_CAPSULE | Freq: Three times a day (TID) | ORAL | 0 refills | Status: DC

## 2023-06-28 MED ORDER — GABAPENTIN 400 MG PO CAPS
400.0000 mg | ORAL_CAPSULE | Freq: Three times a day (TID) | ORAL | 0 refills | Status: DC
  Filled 2023-06-28: qty 90, 30d supply, fill #0
  Filled 2023-06-28: qty 30, 10d supply, fill #0

## 2023-06-28 NOTE — Telephone Encounter (Signed)
 Pt given 30 day refill of Gabapentin. Pt has upcoming visit 07/20/22. Pt stated doesn't understand why he is having this same issue of refusal for the last 3 months. Med last ordered 06/01/23 for 1 month supply.

## 2023-06-28 NOTE — Telephone Encounter (Signed)
 April from [facility] called to report they filled pt.'s gabapentin # 90 pills and pt. Told them to "get it together on your refills."

## 2023-06-28 NOTE — Telephone Encounter (Signed)
 Left message that gabapentin refill request is 5 days early.

## 2023-06-28 NOTE — Telephone Encounter (Signed)
 Medication Refill -  Most Recent Primary Care Visit:  Provider: TANDA BLEACHER  Department: [facility]  Visit Type: OFFICE VISIT  Date: 01/20/2023  Medication:  gabapentin  (NEURONTIN ) 400 MG capsule  Has the patient contacted their pharmacy? Yes (Agent: If no, request that the patient contact the pharmacy for the refill. If patient does not wish to contact the pharmacy document the reason why and proceed with request.) (Agent: If yes, when and what did the pharmacy advise?)  Is this the correct pharmacy for this prescription? Yes If no, delete pharmacy and type the correct one.  This is the patient's preferred pharmacy:  [facility] - [facility] [address] [place] [address] Phone: [phone] Fax: [phone]   Has the prescription been filled recently? No  Is the patient out of the medication? no  Has the patient been seen for an appointment in the last year OR does the patient have an upcoming appointment? Yes  Can we respond through MyChart? Yes  Pt states he requested this on 1/02, and no response

## 2023-06-28 NOTE — Telephone Encounter (Signed)
 Eric Keller from stated 5 days early request to refill. Has been needing it 5 days early. Has been "out " of med in Nov. Pt asking for med early. Tried calling pt and no answer.

## 2023-06-28 NOTE — Telephone Encounter (Signed)
 Pt called and is out of med. Has an appt at end of month. Was last prescribed 06/01/23.   Requested Prescriptions  Pending Prescriptions Disp Refills   gabapentin  (NEURONTIN ) 400 MG capsule 90 capsule 0    Sig: Take 1 capsule (400 mg total) by mouth 3 (three) times daily.     Neurology: Anticonvulsants - gabapentin  Failed - 06/28/2023  1:53 PM      Failed - Cr in normal range and within 360 days    Creatinine  Date Value Ref Range Status  03/01/2012 1.12 0.60 - 1.30 mg/dL Final   Creatinine, Ser  Date Value Ref Range Status  09/02/2021 1.41 (H) 0.61 - 1.24 mg/dL Final         Failed - Completed PHQ-2 or PHQ-9 in the last 360 days      Passed - Valid encounter within last 12 months    Recent Outpatient Visits           5 months ago Essential hypertension, benign   [place] [facility], MD   11 months ago Essential hypertension, benign   [place] [facility] at [facility], MD   1 year ago Essential hypertension, benign   [place] [facility] at [facility], MD   1 year ago Essential hypertension, benign   [place] [facility] at [facility], MD   1 year ago Essential hypertension, benign    Primary Care at [facility], MD       Future Appointments             In 3 weeks Tanda Bleacher, MD [facility] at [facility]

## 2023-06-29 ENCOUNTER — Other Ambulatory Visit: Payer: Self-pay

## 2023-07-11 ENCOUNTER — Other Ambulatory Visit: Payer: Self-pay

## 2023-07-12 ENCOUNTER — Other Ambulatory Visit: Payer: Self-pay

## 2023-07-18 ENCOUNTER — Other Ambulatory Visit: Payer: Self-pay | Admitting: Family Medicine

## 2023-07-19 ENCOUNTER — Other Ambulatory Visit: Payer: Self-pay

## 2023-07-19 MED ORDER — METOPROLOL TARTRATE 25 MG PO TABS
25.0000 mg | ORAL_TABLET | Freq: Two times a day (BID) | ORAL | 1 refills | Status: DC
  Filled 2023-07-19 – 2023-09-20 (×2): qty 180, 90d supply, fill #0
  Filled 2024-01-16 (×3): qty 180, 90d supply, fill #1

## 2023-07-19 MED ORDER — QUETIAPINE FUMARATE 100 MG PO TABS
100.0000 mg | ORAL_TABLET | Freq: Every day | ORAL | 5 refills | Status: DC
  Filled 2023-07-19: qty 30, 30d supply, fill #0
  Filled 2023-08-23: qty 30, 30d supply, fill #1
  Filled 2023-09-20 – 2023-09-21 (×2): qty 30, 30d supply, fill #2
  Filled 2023-10-20: qty 30, 30d supply, fill #3
  Filled 2023-11-23 (×2): qty 30, 30d supply, fill #4
  Filled 2023-12-19: qty 30, 30d supply, fill #5

## 2023-07-19 MED ORDER — GABAPENTIN 400 MG PO CAPS: 400.0000 mg | ORAL_CAPSULE | Freq: Three times a day (TID) | ORAL | 0 refills | Status: DC

## 2023-07-20 ENCOUNTER — Other Ambulatory Visit (HOSPITAL_BASED_OUTPATIENT_CLINIC_OR_DEPARTMENT_OTHER): Payer: Self-pay

## 2023-07-20 ENCOUNTER — Other Ambulatory Visit: Payer: Self-pay

## 2023-07-21 ENCOUNTER — Ambulatory Visit (INDEPENDENT_AMBULATORY_CARE_PROVIDER_SITE_OTHER): Payer: MEDICAID | Admitting: Family Medicine

## 2023-07-21 ENCOUNTER — Other Ambulatory Visit: Payer: Self-pay

## 2023-07-21 ENCOUNTER — Encounter: Payer: Self-pay | Admitting: Family Medicine

## 2023-07-21 VITALS — BP 123/77 | HR 55 | Temp 98.2°F | Resp 16 | Ht 74.0 in | Wt 236.8 lb

## 2023-07-21 DIAGNOSIS — G4700 Insomnia, unspecified: Secondary | ICD-10-CM | POA: Diagnosis not present

## 2023-07-21 DIAGNOSIS — F419 Anxiety disorder, unspecified: Secondary | ICD-10-CM | POA: Diagnosis not present

## 2023-07-21 DIAGNOSIS — F32A Depression, unspecified: Secondary | ICD-10-CM | POA: Diagnosis not present

## 2023-07-21 DIAGNOSIS — I10 Essential (primary) hypertension: Secondary | ICD-10-CM
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
## 2023-07-21 MED ORDER — GABAPENTIN 400 MG PO CAPS
400.0000 mg | ORAL_CAPSULE | Freq: Three times a day (TID) | ORAL | 0 refills | Status: DC
  Filled 2023-07-21 – 2023-07-25 (×2): qty 90, 30d supply, fill #0

## 2023-07-22 ENCOUNTER — Encounter: Payer: Self-pay | Admitting: Family Medicine

## 2023-07-22 NOTE — Progress Notes (Signed)
Established Patient Office Visit  Subjective    Patient ID: Eric Keller, male    DOB: Oct 18, 1985  Age: 38 y.o. MRN: [id number]  CC:  Chief Complaint  Patient presents with   Follow-up    6 month    HPI Eric Keller presents for routine follow up of anxiety/depression, hypertension., and insomnia. Patient reports that he did miss a day or  of meds and noted his mood change. He otherwise denies acute complaints.   Outpatient Encounter Medications as of 07/21/2023  Medication Sig   amoxicillin (AMOXIL) 500 MG capsule Take 1 capsule (500 mg total) by mouth 3 (three) times daily for 7 days.   amoxicillin (AMOXIL) 500 MG capsule Take 1 capsule (500 mg total) by mouth 3 (three) times daily with food until finished.   FLUoxetine (PROZAC) 40 MG capsule Take 1 capsule (40 mg total) by mouth daily.   gabapentin (NEURONTIN) 400 MG capsule Take 1 capsule (400 mg total) by mouth 3 (three) times daily.   hydrOXYzine (ATARAX) 25 MG tablet Take 1 tablet (25 mg total) by mouth 3 (three) times daily as needed for anxiety.   ibuprofen (ADVIL) 600 MG tablet Take 1 tablet by mouth every 4 to 6 hours as needed for pain.   ibuprofen (ADVIL) 800 MG tablet Take 1 tablet (800 mg total) by mouth every 6-8 hours as needed.   lisinopril (ZESTRIL) 20 MG tablet Take 1 tablet (20 mg total) by mouth daily.   metoprolol tartrate (LOPRESSOR) 25 MG tablet Take 1 tablet (25 mg total) by mouth 2 (two) times daily.   Multiple Vitamin (MULTIVITAMIN WITH MINERALS) TABS tablet Take 1 tablet by mouth daily.   QUEtiapine (SEROQUEL) 100 MG tablet Take 1 tablet (100 mg total) by mouth at bedtime.   sildenafil (VIAGRA) 100 MG tablet Take 1 tablet (100 mg total) by mouth daily as needed for erectile dysfunction.   tadalafil (CIALIS) 5 MG tablet Take 1 tablet (5 mg total) by mouth daily.   thiamine 100 MG tablet Take 1 tablet (100 mg total) by mouth daily.   [DISCONTINUED] gabapentin (NEURONTIN) 400 MG capsule Take 1 capsule  (400 mg total) by mouth 3 (three) times daily.   gabapentin (NEURONTIN) 400 MG capsule Take 1 capsule (400 mg total) by mouth 3 (three) times daily.   No facility-administered encounter medications on file as of 07/21/2023.    Past Medical History:  Diagnosis Date   Alcohol withdrawal (HCC) 07/20/2019   Anxiety    Anxiety    Depression    H/O: substance abuse (HCC)    Hypertension     Past Surgical History:  Procedure Laterality Date   WISDOM TOOTH EXTRACTION      Family History  Problem Relation Age of Onset   Cancer Mother    Hypertension Paternal Grandfather    Heart disease Paternal Grandfather        MIs   Heart disease Other    Stroke Other     Social History   Socioeconomic History   Marital status: Single    Spouse name: Not on file   Number of children: 0   Years of education: Not on file   Highest education level: Associate degree: occupational, Scientist, product/process development, or vocational program  Occupational History   Occupation: Journalist, newspaper  Tobacco Use   Smoking status: Never    Passive exposure: Past   Smokeless tobacco: Never  Vaping Use   Vaping status: Never Used  Substance and Sexual Activity  Alcohol use: Not Currently   Drug use: Not Currently    Types: Marijuana    Comment: Vicodin Abuse, BEnzos   Sexual activity: Not Currently  Other Topics Concern   Not on file  Social History Narrative   Not on file   Social Drivers of Health   Financial Resource Strain: Medium Risk (07/18/2023)   Overall Financial Resource Strain (CARDIA)    Difficulty of Paying Living Expenses: Somewhat hard  Food Insecurity: Food Insecurity Present (07/18/2023)   Hunger Vital Sign    Worried About Running Out of Food in the Last Year: Sometimes true    Ran Out of Food in the Last Year: Sometimes true  Transportation Needs: Unmet Transportation Needs (07/18/2023)   PRAPARE - Transportation    Lack of Transportation (Medical): Yes    Lack of Transportation (Non-Medical): Yes   Physical Activity: Sufficiently Active (07/18/2023)   Exercise Vital Sign    Days of Exercise per Week: 5 days    Minutes of Exercise per Session: 60 min  Stress: Stress Concern Present (07/18/2023)   [employer] of Occupational Health - Occupational Stress Questionnaire    Feeling of Stress : To some extent  Social Connections: Moderately Isolated (07/18/2023)   Social Connection and Isolation Panel [NHANES]    Frequency of Communication with Friends and Family: Three times a week    Frequency of Social Gatherings with Friends and Family: Once a week    Attends Religious Services: More than 4 times per year    Active Member of [employer] or Organizations: No    Attends Engineer, structural: Not on file    Marital Status: Never married  Intimate Partner Violence: Not on file    Review of Systems  Psychiatric/Behavioral:  Negative for depression and substance abuse. The patient has insomnia. The patient is not nervous/anxious.   All other systems reviewed and are negative.       Objective    BP 123/77 (BP Location: Right Arm, Patient Position: Sitting, Cuff Size: Large)   Pulse (!) 55   Temp 98.2 F (36.8 C) (Temporal)   Resp 16   Ht 6\' 2"  (1.88 m)   Wt 236 lb 12.8 oz (107.4 kg)   SpO2 95%   BMI 30.40 kg/m   Physical Exam Vitals and nursing note reviewed.  Constitutional:      General: He is not in acute distress. Cardiovascular:     Rate and Rhythm: Normal rate and regular rhythm.  Pulmonary:     Effort: Pulmonary effort is normal.     Breath sounds: Normal breath sounds.  Abdominal:     Palpations: Abdomen is soft.     Tenderness: There is no abdominal tenderness.  Neurological:     General: No focal deficit present.     Mental Status: He is alert and oriented to person, place, and time.  Psychiatric:        Mood and Affect: Mood normal.        Behavior: Behavior normal.         Assessment & Plan:  1. Anxiety and depression (Primary) Appears  stable. Continue   2. Essential hypertension, benign Appears stable. Continue   3. Insomnia, unspecified type Discussed sleep hygiene.      Return in about 3 months (around 10/19/2023) for follow up, chronic med issues.   [name], MD

## 2023-07-25 ENCOUNTER — Other Ambulatory Visit: Payer: Self-pay

## 2023-07-25 ENCOUNTER — Ambulatory Visit: Payer: MEDICAID | Admitting: Family Medicine

## 2023-07-26 ENCOUNTER — Other Ambulatory Visit: Payer: Self-pay

## 2023-08-04 ENCOUNTER — Other Ambulatory Visit: Payer: Self-pay

## 2023-08-13 ENCOUNTER — Other Ambulatory Visit: Payer: Self-pay | Admitting: Family Medicine

## 2023-08-15 ENCOUNTER — Other Ambulatory Visit: Payer: Self-pay

## 2023-08-19 ENCOUNTER — Other Ambulatory Visit: Payer: Self-pay | Admitting: Family Medicine
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
## 2023-08-19 NOTE — Telephone Encounter (Signed)
 Copied from CRM [id number]. Topic: Clinical - Medication Refill >> Aug 19, 2023 11:31 AM Izetta Dakin wrote: Most Recent Primary Care Visit:  Provider: Georganna Skeans  Department: [facility]  Visit Type: OFFICE VISIT  Date: 07/21/2023  Medication: gabapentin (NEURONTIN) 400 MG capsule  Has the patient contacted their pharmacy? Yes (Agent: If no, request that the patient contact the pharmacy for the refill. If patient does not wish to contact the pharmacy document the reason why and proceed with request.) (Agent: If yes, when and what did the pharmacy advise?)  Is this the correct pharmacy for this prescription? Yes If no, delete pharmacy and type the correct one.  This is the patient's preferred pharmacy:  [facility] [address] [place] [address] Phone: [phone] Fax: [phone]   Has the prescription been filled recently? No  Is the patient out of the medication? Yes  Has the patient been seen for an appointment in the last year OR does the patient have an upcoming appointment? Yes  Can we respond through MyChart? Yes  Agent: Please be advised that Rx refills may take up to 3 business days. We ask that you follow-up with your pharmacy.

## 2023-08-22 ENCOUNTER — Other Ambulatory Visit: Payer: Self-pay

## 2023-08-22 ENCOUNTER — Other Ambulatory Visit: Payer: Self-pay | Admitting: Family Medicine

## 2023-08-22 MED ORDER — GABAPENTIN 400 MG PO CAPS: 400.0000 mg | ORAL_CAPSULE | Freq: Three times a day (TID) | ORAL | 2 refills | Status: DC

## 2023-08-22 NOTE — Telephone Encounter (Signed)
 Requested Prescriptions  Pending Prescriptions Disp Refills   gabapentin (NEURONTIN) 400 MG capsule 90 capsule 2    Sig: Take 1 capsule (400 mg total) by mouth 3 (three) times daily.     Neurology: Anticonvulsants - gabapentin Failed - 08/22/2023 11:45 AM      Failed - Cr in normal range and within 360 days    Creatinine  Date Value Ref Range Status  03/01/2012 1.12 0.60 - 1.30 mg/dL Final   Creatinine, Ser  Date Value Ref Range Status  09/02/2021 1.41 (H) 0.61 - 1.24 mg/dL Final         Passed - Completed PHQ-2 or PHQ-9 in the last 360 days      Passed - Valid encounter within last 12 months    Recent Outpatient Visits           1 month ago Anxiety and depression   [place] [facility], MD   7 months ago Essential hypertension, benign   [place] [facility], MD   1 year ago Essential hypertension, benign   [place] [facility], MD   1 year ago Essential hypertension, benign   [place] [facility] at [facility], MD   1 year ago Essential hypertension, benign    [facility] at [facility], MD       Future Appointments             In 1 month Georganna Skeans, MD [facility]

## 2023-08-23 ENCOUNTER — Other Ambulatory Visit: Payer: Self-pay

## 2023-08-23 ENCOUNTER — Other Ambulatory Visit (HOSPITAL_COMMUNITY): Payer: Self-pay

## 2023-08-23 ENCOUNTER — Other Ambulatory Visit: Payer: Self-pay | Admitting: Family Medicine

## 2023-08-23 MED ORDER — GABAPENTIN 400 MG PO CAPS
400.0000 mg | ORAL_CAPSULE | Freq: Three times a day (TID) | ORAL | 0 refills | Status: DC
  Filled 2023-08-23: qty 90, 30d supply, fill #0

## 2023-09-08 ENCOUNTER — Other Ambulatory Visit: Payer: Self-pay

## 2023-09-09 ENCOUNTER — Other Ambulatory Visit: Payer: Self-pay

## 2023-09-20 ENCOUNTER — Other Ambulatory Visit: Payer: Self-pay | Admitting: Family Medicine

## 2023-09-20 ENCOUNTER — Other Ambulatory Visit: Payer: Self-pay

## 2023-09-21 ENCOUNTER — Other Ambulatory Visit: Payer: Self-pay

## 2023-09-22 ENCOUNTER — Other Ambulatory Visit: Payer: Self-pay

## 2023-09-22 ENCOUNTER — Other Ambulatory Visit: Payer: Self-pay | Admitting: Family Medicine

## 2023-09-22 MED ORDER — GABAPENTIN 400 MG PO CAPS
400.0000 mg | ORAL_CAPSULE | Freq: Three times a day (TID) | ORAL | 0 refills | Status: DC
  Filled 2023-09-22: qty 90, 30d supply, fill #0

## 2023-09-22 NOTE — Telephone Encounter (Signed)
 Requested medication (s) are due for refill today: Yes  Requested medication (s) are on the active medication list: Yes  Last refill:  08/23/23  Future visit scheduled: Yes  Notes to clinic:  Unable to refill per protocol due to failed labs, no updated results.Patient would like a call back, out of meds     Requested Prescriptions  Pending Prescriptions Disp Refills   gabapentin (NEURONTIN) 400 MG capsule 90 capsule 0    Sig: Take 1 capsule (400 mg total) by mouth 3 (three) times daily.     Neurology: Anticonvulsants - gabapentin Failed - 09/22/2023 10:34 AM      Failed - Cr in normal range and within 360 days    Creatinine  Date Value Ref Range Status  03/01/2012 1.12 0.60 - 1.30 mg/dL Final   Creatinine, Ser  Date Value Ref Range Status  09/02/2021 1.41 (H) 0.61 - 1.24 mg/dL Final         Passed - Completed PHQ-2 or PHQ-9 in the last 360 days      Passed - Valid encounter within last 12 months    Recent Outpatient Visits           2 months ago Anxiety and depression   [place] [facility], MD   8 months ago Essential hypertension, benign   [place] [facility], MD   1 year ago Essential hypertension, benign   [place] [facility], MD   1 year ago Essential hypertension, benign   [place] [facility] at [facility], MD   1 year ago Essential hypertension, benign   [place] [facility] at [facility], MD       Future Appointments             In 3 weeks Georganna Skeans, MD [facility] at [facility]

## 2023-10-04 ENCOUNTER — Other Ambulatory Visit: Payer: Self-pay

## 2023-10-04 ENCOUNTER — Other Ambulatory Visit (HOSPITAL_COMMUNITY): Payer: Self-pay

## 2023-10-04 MED ORDER — AMOXICILLIN 500 MG PO CAPS
500.0000 mg | ORAL_CAPSULE | Freq: Three times a day (TID) | ORAL | 0 refills | Status: DC
  Filled 2023-10-04: qty 21, 7d supply, fill #0

## 2023-10-04 MED ORDER — IBUPROFEN 800 MG PO TABS
800.0000 mg | ORAL_TABLET | Freq: Three times a day (TID) | ORAL | 0 refills | Status: DC | PRN
  Filled 2023-10-04: qty 15, 5d supply, fill #0

## 2023-10-17 ENCOUNTER — Other Ambulatory Visit: Payer: Self-pay

## 2023-10-17 ENCOUNTER — Other Ambulatory Visit: Payer: Self-pay | Admitting: Family Medicine

## 2023-10-17 MED ORDER — GABAPENTIN 400 MG PO CAPS
400.0000 mg | ORAL_CAPSULE | Freq: Three times a day (TID) | ORAL | 0 refills | Status: DC
  Filled 2023-10-17: qty 90, 30d supply, fill #0

## 2023-10-17 MED ORDER — GABAPENTIN 400 MG PO CAPS: 400.0000 mg | ORAL_CAPSULE | Freq: Three times a day (TID) | ORAL | 2 refills | Status: DC

## 2023-10-17 NOTE — Telephone Encounter (Signed)
 Copied from CRM [id number]. Topic: Clinical - Medication Refill >> Oct 17, 2023 12:37 PM Baldemar Lev wrote: Most Recent Primary Care Visit:  Provider: Abraham Abo  Department: [facility]  Visit Type: OFFICE VISIT  Date: 07/21/2023  Medication: gabapentin  (NEURONTIN ) 400 MG capsule  With 2 refills   Has the patient contacted their pharmacy? Yes (Agent: If no, request that the patient contact the pharmacy for the refill. If patient does not wish to contact the pharmacy document the reason why and proceed with request.) (Agent: If yes, when and what did the pharmacy advise?)  Is this the correct pharmacy for this prescription? Yes If no, delete pharmacy and type the correct one.  This is the patient's preferred pharmacy:  [facility] [address] [place] [address] Phone: [phone] Fax: [phone]   Has the prescription been filled recently? Yes  Is the patient out of the medication? Will be soon.   Has the patient been seen for an appointment in the last year OR does the patient have an upcoming appointment? Yes  Can we respond through MyChart? Yes  Agent: Please be advised that Rx refills may take up to 3 business days. We ask that you follow-up with your pharmacy.

## 2023-10-18 ENCOUNTER — Other Ambulatory Visit: Payer: Self-pay

## 2023-10-19 ENCOUNTER — Ambulatory Visit (INDEPENDENT_AMBULATORY_CARE_PROVIDER_SITE_OTHER): Payer: MEDICAID | Admitting: Family Medicine

## 2023-10-19 ENCOUNTER — Encounter: Payer: Self-pay | Admitting: Family Medicine

## 2023-10-19 ENCOUNTER — Other Ambulatory Visit: Payer: Self-pay

## 2023-10-19 VITALS — BP 139/82 | HR 60 | Wt 239.6 lb

## 2023-10-19 DIAGNOSIS — F419 Anxiety disorder, unspecified: Secondary | ICD-10-CM | POA: Diagnosis not present

## 2023-10-19 DIAGNOSIS — I10 Essential (primary) hypertension: Secondary | ICD-10-CM

## 2023-10-19 DIAGNOSIS — L989 Disorder of the skin and subcutaneous tissue, unspecified: Secondary | ICD-10-CM

## 2023-10-19 DIAGNOSIS — F32A Depression, unspecified: Secondary | ICD-10-CM

## 2023-10-19 MED ORDER — FLUOXETINE HCL 40 MG PO CAPS
40.0000 mg | ORAL_CAPSULE | Freq: Every day | ORAL | 1 refills | Status: DC
  Filled 2023-10-19 – 2023-11-23 (×2): qty 90, 90d supply, fill #0
  Filled 2024-03-29: qty 90, 90d supply, fill #1

## 2023-10-19 MED ORDER — GABAPENTIN 400 MG PO CAPS
400.0000 mg | ORAL_CAPSULE | Freq: Three times a day (TID) | ORAL | 1 refills | Status: DC
  Filled 2023-10-20: qty 270, 90d supply, fill #0
  Filled 2024-01-16 (×2): qty 270, 90d supply, fill #1

## 2023-10-20 ENCOUNTER — Encounter: Payer: Self-pay | Admitting: Family Medicine

## 2023-10-20 ENCOUNTER — Other Ambulatory Visit: Payer: Self-pay

## 2023-10-20 NOTE — Progress Notes (Signed)
 Established Patient Office Visit  Subjective    Patient ID: Eric Keller, male    DOB: 1986/02/29  Age: 38 y.o. MRN: [id number]  CC:  Chief Complaint  Patient presents with   Medical Management of Chronic Issues    HPI [name] presents for follow up of chronic med issues including hypertension and anxiety/depression. Patient also reports a lesion on his scalp that is changing and he may want it removed.   Outpatient Encounter Medications as of 10/19/2023  Medication Sig   lisinopril  (ZESTRIL ) 20 MG tablet Take 1 tablet (20 mg total) by mouth daily.   metoprolol  tartrate (LOPRESSOR ) 25 MG tablet Take 1 tablet (25 mg total) by mouth 2 (two) times daily.   tadalafil  (CIALIS ) 5 MG tablet Take 1 tablet (5 mg total) by mouth daily.   [DISCONTINUED] FLUoxetine  (PROZAC ) 40 MG capsule Take 1 capsule (40 mg total) by mouth daily.   [DISCONTINUED] gabapentin  (NEURONTIN ) 400 MG capsule Take 1 capsule (400 mg total) by mouth 3 (three) times daily.   amoxicillin  (AMOXIL ) 500 MG capsule Take 1 capsule (500 mg total) by mouth 3 (three) times daily for 7 days. (Patient not taking: Reported on 10/19/2023)   amoxicillin  (AMOXIL ) 500 MG capsule Take 1 capsule (500 mg total) by mouth 3 (three) times daily with food until finished. (Patient not taking: Reported on 10/19/2023)   amoxicillin  (AMOXIL ) 500 MG capsule Take 1 capsule (500 mg total) by mouth 3 (three) times daily until gone. (Patient not taking: Reported on 10/19/2023)   FLUoxetine  (PROZAC ) 40 MG capsule Take 1 capsule (40 mg total) by mouth daily.   gabapentin  (NEURONTIN ) 400 MG capsule Take 1 capsule (400 mg total) by mouth 3 (three) times daily.   gabapentin  (NEURONTIN ) 400 MG capsule Take 1 capsule (400 mg total) by mouth 3 (three) times daily.   hydrOXYzine  (ATARAX ) 25 MG tablet Take 1 tablet (25 mg total) by mouth 3 (three) times daily as needed for anxiety. (Patient not taking: Reported on 10/19/2023)   ibuprofen  (ADVIL ) 600 MG tablet  Take 1 tablet by mouth every 4 to 6 hours as needed for pain. (Patient not taking: Reported on 10/19/2023)   ibuprofen  (ADVIL ) 800 MG tablet Take 1 tablet (800 mg total) by mouth every 6-8 hours as needed. (Patient not taking: Reported on 10/19/2023)   ibuprofen  (ADVIL ) 800 MG tablet Take 1 tablet (800 mg total) by mouth every 8 (eight) hours as needed. (Patient not taking: Reported on 10/19/2023)   Multiple Vitamin (MULTIVITAMIN WITH MINERALS) TABS tablet Take 1 tablet by mouth daily. (Patient not taking: Reported on 10/19/2023)   QUEtiapine  (SEROQUEL ) 100 MG tablet Take 1 tablet (100 mg total) by mouth at bedtime. (Patient not taking: Reported on 10/19/2023)   sildenafil  (VIAGRA ) 100 MG tablet Take 1 tablet (100 mg total) by mouth daily as needed for erectile dysfunction. (Patient not taking: Reported on 10/19/2023)   thiamine  100 MG tablet Take 1 tablet (100 mg total) by mouth daily. (Patient not taking: Reported on 10/19/2023)   No facility-administered encounter medications on file as of 10/19/2023.    Past Medical History:  Diagnosis Date   Alcohol  withdrawal (HCC) 07/20/2019   Anxiety    Anxiety    Depression    H/O: substance abuse (HCC)    Hypertension     Past Surgical History:  Procedure Laterality Date   WISDOM TOOTH EXTRACTION      Family History  Problem Relation Age of Onset   Cancer Mother  Hypertension Paternal Grandfather    Heart disease Paternal Grandfather        MIs   Heart disease Other    Stroke Other     Social History   Socioeconomic History   Marital status: Single    Spouse name: Not on file   Number of children: 0   Years of education: Not on file   Highest education level: Associate degree: occupational, Scientist, product/process development, or vocational program  Occupational History   Occupation: Journalist, newspaper  Tobacco Use   Smoking status: Never    Passive exposure: Past   Smokeless tobacco: Never  Vaping Use   Vaping status: Never Used  Substance and Sexual  Activity   Alcohol  use: Not Currently   Drug use: Not Currently    Types: Marijuana    Comment: Vicodin Abuse, BEnzos   Sexual activity: Not Currently  Other Topics Concern   Not on file  Social History Narrative   Not on file   Social Drivers of Health   Financial Resource Strain: Medium Risk (07/18/2023)   Overall Financial Resource Strain (CARDIA)    Difficulty of Paying Living Expenses: Somewhat hard  Food Insecurity: Food Insecurity Present (07/18/2023)   Hunger Vital Sign    Worried About Running Out of Food in the Last Year: Sometimes true    Ran Out of Food in the Last Year: Sometimes true  Transportation Needs: Unmet Transportation Needs (07/18/2023)   PRAPARE - Transportation    Lack of Transportation (Medical): Yes    Lack of Transportation (Non-Medical): Yes  Physical Activity: Sufficiently Active (07/18/2023)   Exercise Vital Sign    Days of Exercise per Week: 5 days    Minutes of Exercise per Session: 60 min  Stress: Stress Concern Present (07/18/2023)   [employer] of Occupational Health - Occupational Stress Questionnaire    Feeling of Stress : To some extent  Social Connections: Moderately Isolated (07/18/2023)   Social Connection and Isolation Panel [NHANES]    Frequency of Communication with Friends and Family: Three times a week    Frequency of Social Gatherings with Friends and Family: Once a week    Attends Religious Services: More than 4 times per year    Active Member of [employer] or Organizations: No    Attends Engineer, structural: Not on file    Marital Status: Never married  Intimate Partner Violence: Not on file    Review of Systems  All other systems reviewed and are negative.       Objective    BP 139/82 (BP Location: Right Arm, Patient Position: Sitting, Cuff Size: Normal)   Pulse 60   Wt 239 lb 9.6 oz (108.7 kg)   SpO2 98%   BMI 30.76 kg/m   Physical Exam Vitals and nursing note reviewed.  Constitutional:      General:  He is not in acute distress. Cardiovascular:     Rate and Rhythm: Normal rate and regular rhythm.  Pulmonary:     Effort: Pulmonary effort is normal.     Breath sounds: Normal breath sounds.  Abdominal:     Palpations: Abdomen is soft.     Tenderness: There is no abdominal tenderness.  Skin:    Comments: Scalp with solitary pedunculated papular lesion - NT  Neurological:     General: No focal deficit present.     Mental Status: He is alert and oriented to person, place, and time.         Assessment & Plan:  1. Scalp lesion (Primary)  - Ambulatory referral to Dermatology  2. Essential hypertension, benign  3. Anxiety and depression   Return in about 6 months (around 04/19/2024) for physical.   Arlo Lama, MD

## 2023-11-01 ENCOUNTER — Other Ambulatory Visit: Payer: Self-pay

## 2023-11-01 MED ORDER — AMOXICILLIN 500 MG PO CAPS
500.0000 mg | ORAL_CAPSULE | Freq: Three times a day (TID) | ORAL | 0 refills | Status: DC
  Filled 2023-11-01: qty 21, 7d supply, fill #0

## 2023-11-02 ENCOUNTER — Other Ambulatory Visit: Payer: Self-pay

## 2023-11-08 ENCOUNTER — Other Ambulatory Visit: Payer: Self-pay

## 2023-11-08 ENCOUNTER — Other Ambulatory Visit (HOSPITAL_COMMUNITY): Payer: Self-pay

## 2023-11-08 MED ORDER — IBUPROFEN 800 MG PO TABS
800.0000 mg | ORAL_TABLET | Freq: Three times a day (TID) | ORAL | 1 refills | Status: DC | PRN
  Filled 2023-11-08: qty 20, 10d supply, fill #0

## 2023-11-08 MED ORDER — CHLORHEXIDINE GLUCONATE 0.12 % MT SOLN
OROMUCOSAL | 0 refills | Status: AC
Start: 2023-11-08 — End: ?
  Filled 2023-11-08: qty 473, 10d supply, fill #0

## 2023-11-08 MED ORDER — AMOXICILLIN 500 MG PO CAPS
500.0000 mg | ORAL_CAPSULE | Freq: Three times a day (TID) | ORAL | 0 refills | Status: DC
  Filled 2023-11-08: qty 21, 7d supply, fill #0

## 2023-11-08 MED ORDER — ACETAMINOPHEN-CODEINE 300-30 MG PO TABS
1.0000 | ORAL_TABLET | Freq: Four times a day (QID) | ORAL | 0 refills | Status: DC | PRN
  Filled 2023-11-08: qty 10, 3d supply, fill #0

## 2023-11-18 ENCOUNTER — Other Ambulatory Visit (HOSPITAL_COMMUNITY): Payer: Self-pay

## 2023-11-23 ENCOUNTER — Other Ambulatory Visit: Payer: Self-pay

## 2023-11-23 ENCOUNTER — Other Ambulatory Visit: Payer: Self-pay | Admitting: Family Medicine

## 2023-11-24 ENCOUNTER — Other Ambulatory Visit: Payer: Self-pay

## 2023-11-24 MED ORDER — TADALAFIL 5 MG PO TABS
5.0000 mg | ORAL_TABLET | Freq: Every day | ORAL | 1 refills | Status: DC
  Filled 2023-11-24: qty 10, 10d supply, fill #0
  Filled 2023-12-19: qty 10, 10d supply, fill #1
  Filled 2024-01-16 (×4): qty 10, 10d supply, fill #2
  Filled 2024-02-08: qty 10, 10d supply, fill #3
  Filled 2024-02-27: qty 10, 30d supply, fill #4
  Filled 2024-03-18: qty 10, 10d supply, fill #4
  Filled 2024-04-13: qty 10, 10d supply, fill #5

## 2023-12-19 ENCOUNTER — Other Ambulatory Visit: Payer: Self-pay

## 2023-12-19 ENCOUNTER — Other Ambulatory Visit: Payer: Self-pay | Admitting: Family Medicine

## 2023-12-19 MED ORDER — LISINOPRIL 20 MG PO TABS
20.0000 mg | ORAL_TABLET | Freq: Every day | ORAL | 1 refills | Status: DC
  Filled 2023-12-19: qty 90, 90d supply, fill #0
  Filled 2024-03-23: qty 90, 90d supply, fill #1

## 2023-12-21 ENCOUNTER — Emergency Department (HOSPITAL_COMMUNITY)
Admission: EM | Admit: 2023-12-21 | Discharge: 2023-12-22 | Disposition: A | Discharge status: Home / Self Care | Payer: MEDICAID | Attending: Emergency Medicine | Admitting: Emergency Medicine

## 2023-12-21 DIAGNOSIS — R531 Weakness: Secondary | ICD-10-CM | POA: Insufficient documentation

## 2023-12-21 DIAGNOSIS — R55 Syncope and collapse: Secondary | ICD-10-CM | POA: Diagnosis present

## 2023-12-21 DIAGNOSIS — R61 Generalized hyperhidrosis: Secondary | ICD-10-CM | POA: Diagnosis not present

## 2023-12-21 DIAGNOSIS — R410 Disorientation, unspecified: Secondary | ICD-10-CM | POA: Diagnosis not present

## 2023-12-21 DIAGNOSIS — I10 Essential (primary) hypertension: Secondary | ICD-10-CM | POA: Diagnosis not present

## 2023-12-21 DIAGNOSIS — D72829 Elevated white blood cell count, unspecified: Secondary | ICD-10-CM | POA: Insufficient documentation

## 2023-12-21 DIAGNOSIS — Z79899 Other long term (current) drug therapy: Secondary | ICD-10-CM | POA: Insufficient documentation

## 2023-12-22 ENCOUNTER — Emergency Department (HOSPITAL_COMMUNITY): Payer: MEDICAID

## 2023-12-22 ENCOUNTER — Other Ambulatory Visit: Payer: Self-pay

## 2023-12-22 LAB — URINALYSIS, ROUTINE W REFLEX MICROSCOPIC
Bilirubin Urine: NEGATIVE
Glucose, UA: NEGATIVE mg/dL
Hgb urine dipstick: NEGATIVE
Ketones, ur: NEGATIVE mg/dL
Leukocytes,Ua: NEGATIVE
Nitrite: NEGATIVE
Protein, ur: NEGATIVE mg/dL
Specific Gravity, Urine: 1.015 (ref 1.005–1.030)
pH: 6 (ref 5.0–8.0)

## 2023-12-22 LAB — COMPREHENSIVE METABOLIC PANEL WITH GFR
ALT: 39 U/L (ref 0–44)
AST: 46 U/L — ABNORMAL HIGH (ref 15–41)
Albumin: 3.5 g/dL (ref 3.5–5.0)
Alkaline Phosphatase: 41 U/L (ref 38–126)
Anion gap: 10 (ref 5–15)
BUN: 12 mg/dL (ref 6–20)
CO2: 27 mmol/L (ref 22–32)
Calcium: 8.9 mg/dL (ref 8.9–10.3)
Chloride: 100 mmol/L (ref 98–111)
Creatinine, Ser: 1.39 mg/dL — ABNORMAL HIGH (ref 0.61–1.24)
GFR, Estimated: >60 mL/min (ref >60)
Glucose, Bld: 83 mg/dL (ref 70–99)
Potassium: 4.4 mmol/L (ref 3.5–5.1)
Sodium: 137 mmol/L (ref 135–145)
Total Bilirubin: 0.6 mg/dL (ref 0.0–1.2)
Total Protein: 5.6 g/dL — ABNORMAL LOW (ref 6.5–8.1)

## 2023-12-22 LAB — CBC
HCT: 50.4 % (ref 39.0–52.0)
Hemoglobin: 16.9 g/dL (ref 13.0–17.0)
MCH: 31.1 pg (ref 26.0–34.0)
MCHC: 33.5 g/dL (ref 30.0–36.0)
MCV: 92.8 fL (ref 80.0–100.0)
Platelets: 277 10*3/uL (ref 150–400)
RBC: 5.43 MIL/uL (ref 4.22–5.81)
RDW: 13.5 % (ref 11.5–15.5)
WBC: 16.6 10*3/uL — ABNORMAL HIGH (ref 4.0–10.5)
nRBC: 0 % (ref 0.0–0.2)

## 2023-12-22 LAB — TROPONIN I (HIGH SENSITIVITY)
Troponin I (High Sensitivity): 9 ng/L (ref <18)
Troponin I (High Sensitivity): 11 ng/L (ref <18)

## 2023-12-22 LAB — CBG MONITORING, ED
Glucose-Capillary: 127 mg/dL — ABNORMAL HIGH (ref 70–99)
Glucose-Capillary: 97 mg/dL (ref 70–99)

## 2023-12-22 LAB — MAGNESIUM: Magnesium: 2.2 mg/dL (ref 1.7–2.4)

## 2023-12-22 NOTE — Discharge Instructions (Signed)
 As we discussed, your workup in the ER today was reassuring for acute findings.  Your blood sugars have been normal throughout your stay today and your workup otherwise was reassuring.  I do recommend that you call your PCP to schedule a close follow-up appointment for continued evaluation and management of the symptoms you are experiencing.  Return if development of any new or worsening symptoms

## 2023-12-22 NOTE — ED Provider Notes (Signed)
 [place] [facility]ENC[facility] DEPARTMENT AT [facility] Provider Note   CSN: [id number] Arrival date & time: 12/21/23  2358     Patient presents with: No chief complaint on file.   Eric Keller is a 38 y.o. male.   Patient with history of hypertension presents today with complaints of weakness. He reports that same began around 8 pm tonight after he ate dinner. He reports that he has had a history of similar events previously over the past several years usually after he eats and is usually able to eat foods with sugar and have his symptoms resolved. Notes that he felt very weak, confused, and diaphoretic. He reports that tonight he continued to feel unwell despite drinking juice and eating several cookies and therefore EMS was called. With EMS his sugar was initially 87 and he was feeling some better at time of their arrival. Denies any chest pain or shortness of breath. Did feel nauseous but denies any vomiting. Reports that he has never been diagnosed with diabetes, and has never been evaluated for these episodes previously. Has never had a documented low blood sugar when he is having these symptoms.   The history is provided by the patient. No language interpreter was used.       Prior to Admission medications   Medication Sig Start Date End Date Taking? Authorizing Provider  acetaminophen -codeine  (TYLENOL  #3) 300-30 MG tablet Take 1 tablet by mouth every 6 (six) hours as needed for dental pain. 11/08/23     amoxicillin  (AMOXIL ) 500 MG capsule Take 1 capsule (500 mg total) by mouth 3 (three) times daily for 7 days. Patient not taking: Reported on 10/19/2023 08/02/22     amoxicillin  (AMOXIL ) 500 MG capsule Take 1 capsule (500 mg total) by mouth 3 (three) times daily with food until finished. Patient not taking: Reported on 10/19/2023 03/09/23     amoxicillin  (AMOXIL ) 500 MG capsule Take 1 capsule (500 mg total) by mouth every 8 (eight) hours until finished. 11/08/23     chlorhexidine   (PERIDEX ) 0.12 % solution Swish 1 capful for one minute and spit twice daily for 10 days. 11/08/23     FLUoxetine  (PROZAC ) 40 MG capsule Take 1 capsule (40 mg total) by mouth daily. 10/19/23   Tanda Bleacher, MD  gabapentin  (NEURONTIN ) 400 MG capsule Take 1 capsule (400 mg total) by mouth 3 (three) times daily. 10/20/23   Tanda Bleacher, MD  hydrOXYzine  (ATARAX ) 25 MG tablet Take 1 tablet (25 mg total) by mouth 3 (three) times daily as needed for anxiety. Patient not taking: Reported on 10/19/2023 07/23/21   Tanda Bleacher, MD  ibuprofen  (ADVIL ) 600 MG tablet Take 1 tablet by mouth every 4 to 6 hours as needed for pain. Patient not taking: Reported on 10/19/2023 03/09/23     ibuprofen  (ADVIL ) 800 MG tablet Take 1 tablet (800 mg total) by mouth every 6-8 hours as needed. Patient not taking: Reported on 10/19/2023 08/02/22     ibuprofen  (ADVIL ) 800 MG tablet Take 1 tablet (800 mg total) by mouth every 8 (eight) hours as needed for pain. 11/08/23     lisinopril  (ZESTRIL ) 20 MG tablet Take 1 tablet (20 mg total) by mouth daily. 12/19/23   Tanda Bleacher, MD  metoprolol  tartrate (LOPRESSOR ) 25 MG tablet Take 1 tablet (25 mg total) by mouth 2 (two) times daily. 07/19/23   Tanda Bleacher, MD  Multiple Vitamin (MULTIVITAMIN WITH MINERALS) TABS tablet Take 1 tablet by mouth daily. Patient not taking: Reported on 10/19/2023 06/12/21  Hansel Comer RAMAN, MD  QUEtiapine  (SEROQUEL ) 100 MG tablet Take 1 tablet (100 mg total) by mouth at bedtime. Patient not taking: Reported on 10/19/2023 07/19/23   Tanda Bleacher, MD  sildenafil  (VIAGRA ) 100 MG tablet Take 1 tablet (100 mg total) by mouth daily as needed for erectile dysfunction. Patient not taking: Reported on 10/19/2023 11/19/22   Tanda Bleacher, MD  tadalafil  (CIALIS ) 5 MG tablet Take 1 tablet (5 mg total) by mouth daily. 11/24/23   Danton Jon HERO, PA-C  thiamine  100 MG tablet Take 1 tablet (100 mg total) by mouth daily. Patient not taking: Reported on 10/19/2023 06/12/21    Hansel Comer RAMAN, MD    Allergies: Patient has no known allergies.    Review of Systems  Neurological:  Positive for weakness.  All other systems reviewed and are negative.   Updated Vital Signs BP 136/77   Pulse (!) 59   Resp (!) 9   Ht 6' 2 (1.88 m)   Wt 108.9 kg   SpO2 99%   BMI 30.81 kg/m   Physical Exam Vitals and nursing note reviewed.  Constitutional:      General: He is not in acute distress.    Appearance: Normal appearance. He is normal weight. He is not ill-appearing, toxic-appearing or diaphoretic.  HENT:     Head: Normocephalic and atraumatic.  Eyes:     Extraocular Movements: Extraocular movements intact.     Pupils: Pupils are equal, round, and reactive to light.  Cardiovascular:     Rate and Rhythm: Normal rate and regular rhythm.     Heart sounds: Normal heart sounds.  Pulmonary:     Effort: Pulmonary effort is normal. No respiratory distress.     Breath sounds: Normal breath sounds.  Abdominal:     General: Abdomen is flat.     Palpations: Abdomen is soft.     Tenderness: There is no abdominal tenderness.  Musculoskeletal:        General: Normal range of motion.     Cervical back: Normal range of motion and neck supple.     Right lower leg: No edema.     Left lower leg: No edema.  Skin:    General: Skin is warm and dry.  Neurological:     General: No focal deficit present.     Mental Status: He is alert and oriented to person, place, and time.  Psychiatric:        Mood and Affect: Mood normal.        Behavior: Behavior normal.     (all labs ordered are listed, but only abnormal results are displayed) Labs Reviewed  COMPREHENSIVE METABOLIC PANEL WITH GFR - Abnormal; Notable for the following components:      Result Value   Creatinine, Ser 1.39 (*)    Total Protein 5.6 (*)    AST 46 (*)    All other components within normal limits  CBC - Abnormal; Notable for the following components:   WBC 16.6 (*)    All other components within  normal limits  CBG MONITORING, ED - Abnormal; Notable for the following components:   Glucose-Capillary 127 (*)    All other components within normal limits  URINALYSIS, ROUTINE W REFLEX MICROSCOPIC  MAGNESIUM   CBG MONITORING, ED  TROPONIN I (HIGH SENSITIVITY)  TROPONIN I (HIGH SENSITIVITY)    EKG: EKG Interpretation Date/Time:  Thursday December 22 2023 00:14:51 EDT Ventricular Rate:  60 PR Interval:  143 QRS Duration:  150 QT Interval:  512 QTC Calculation: 512 R Axis:   48  Text Interpretation: Sinus rhythm IVCD, consider atypical LBBB Abnormal ekg Confirmed by Midge Golas ([id number]) on 12/22/2023 12:19:19 AM  Radiology: DG Chest 2 View Result Date: 12/22/2023 CLINICAL DATA:  Weakness and dizziness. EXAM: CHEST - 2 VIEW COMPARISON:  September 02, 2021 FINDINGS: The heart size and mediastinal contours are within normal limits. Both lungs are clear. The visualized skeletal structures are unremarkable. IMPRESSION: No active cardiopulmonary disease. Electronically Signed   By: Suzen Dials M.D.   On: 12/22/2023 01:06     Procedures   Medications Ordered in the ED - No data to display                                  Medical Decision Making Amount and/or Complexity of Data Reviewed Labs: ordered. Radiology: ordered.   This patient is a 38 y.o. male who presents to the ED for concern of weakness, this involves an extensive number of treatment options, and is a complaint that carries with it a high risk of complications and morbidity. The emergent differential diagnosis prior to evaluation includes, but is not limited to,  CVA, ACS, arrhythmia, syncope, orthostatic hypotension, sepsis, hypoglycemia, hypoxia, electrolyte disturbance, endocrine disorder, anemia, environmental exposure, polypharmacy  This is not an exhaustive differential.   Past Medical History / Co-morbidities / Social History:  has a past medical history of Alcohol  withdrawal (HCC) (07/20/2019), Anxiety,  Anxiety, Depression, H/O: substance abuse (HCC), and Hypertension.  Additional history: Chart reviewed.  Physical Exam: Physical exam performed. The pertinent findings include: alert and oriented and neurologically intact without focal deficits. Well appearing, no acute physical exam abnormalities  Lab Tests: I ordered, and personally interpreted labs.  The pertinent results include:  CBG 97 --> 127. WBC 16.6, unclear etiology. Creatinine 1.39 unchanged from previous 2 years ago. UA noninfectious, Troponins negative and flat   Imaging Studies: I ordered imaging studies including CXR. I independently visualized and interpreted imaging which showed NAD. I agree with the radiologist interpretation.   Cardiac Monitoring:  The patient was maintained on a cardiac monitor.  My attending physician Dr. Midge viewed and interpreted the cardiac monitored which showed an underlying rhythm of: sinus rhythm, no STEMI. I agree with this interpretation.   Disposition: After consideration of the diagnostic results and the patients response to treatment, I feel that emergency department workup does not suggest an emergent condition requiring admission or immediate intervention beyond what has been performed at this time. The plan is: discharge with close outpatient follow-up and return precautions. Patients work-up is benign, does have leukocytosis but no infectious symptoms, unclear etiology.  Upon reassessment he feels back to normal and ready to go home. His blood sugars have been normal throughout his stay here today.  Recommend that he call his PCP to schedule close follow-up appointment for continued evaluation. Evaluation and diagnostic testing in the emergency department does not suggest an emergent condition requiring admission or immediate intervention beyond what has been performed at this time.  Plan for discharge with close PCP follow-up.  Patient is understanding and amenable with plan, educated on  red flag symptoms that would prompt immediate return.  Patient discharged in stable condition.   This is a shared visit with supervising physician Dr. Midge who has independently evaluated patient & provided guidance in evaluation/management/disposition, in agreement with care   Final diagnoses:  Near syncope    ED  Discharge Orders     None     An After Visit Summary was printed and given to the patient.      Jana Swartzlander A, PA-C 12/22/23 [id number]    Midge Golas, MD 12/22/23 [phone]

## 2023-12-22 NOTE — ED Triage Notes (Signed)
 Pt BIB EMS from home. Pt reports started feeling hypoglycemic evening right after dinner- weak, diaphoretic, felt out of it dizzy, blurred vision. Similar hypoglycemic episodes in past, pt reports current episode has felt much worse. No prior diagnosis of DM. Pt reports some improvement of symptoms, he had OJ and cookies prior to ems arrival, feels woozy currently. A&Ox4   EMS VS 1701/104, cbg 87-165, HR 65

## 2023-12-26 ENCOUNTER — Other Ambulatory Visit: Payer: Self-pay

## 2024-01-16 ENCOUNTER — Other Ambulatory Visit: Payer: Self-pay | Admitting: Family Medicine

## 2024-01-16 ENCOUNTER — Other Ambulatory Visit: Payer: Self-pay

## 2024-01-18 ENCOUNTER — Other Ambulatory Visit: Payer: Self-pay

## 2024-01-18 MED ORDER — QUETIAPINE FUMARATE 100 MG PO TABS
100.0000 mg | ORAL_TABLET | Freq: Every day | ORAL | 3 refills | Status: DC
  Filled 2024-01-18: qty 30, 30d supply, fill #0
  Filled 2024-02-27: qty 30, 30d supply, fill #1
  Filled 2024-03-23: qty 30, 30d supply, fill #2
  Filled 2024-04-26: qty 30, 30d supply, fill #3

## 2024-01-25 ENCOUNTER — Other Ambulatory Visit: Payer: Self-pay

## 2024-02-08 ENCOUNTER — Other Ambulatory Visit: Payer: Self-pay

## 2024-02-27 ENCOUNTER — Other Ambulatory Visit: Payer: Self-pay

## 2024-03-18 ENCOUNTER — Other Ambulatory Visit: Payer: Self-pay | Admitting: Family Medicine

## 2024-03-19 ENCOUNTER — Other Ambulatory Visit: Payer: Self-pay

## 2024-03-19 NOTE — Telephone Encounter (Signed)
Pt has appt on 10/30

## 2024-03-22 ENCOUNTER — Other Ambulatory Visit: Payer: Self-pay

## 2024-03-22 MED ORDER — GABAPENTIN 400 MG PO CAPS
400.0000 mg | ORAL_CAPSULE | Freq: Three times a day (TID) | ORAL | 0 refills | Status: DC
  Filled 2024-03-22 – 2024-04-13 (×3): qty 270, 90d supply, fill #0

## 2024-03-23 ENCOUNTER — Other Ambulatory Visit: Payer: Self-pay

## 2024-03-29 ENCOUNTER — Encounter (INDEPENDENT_AMBULATORY_CARE_PROVIDER_SITE_OTHER): Payer: Self-pay

## 2024-03-29 ENCOUNTER — Other Ambulatory Visit: Payer: Self-pay

## 2024-04-03 ENCOUNTER — Other Ambulatory Visit: Payer: Self-pay

## 2024-04-05 ENCOUNTER — Ambulatory Visit
Admission: EM | Admit: 2024-04-05 | Discharge: 2024-04-05 | Disposition: A | Discharge status: Home / Self Care | Payer: MEDICAID | Attending: Family Medicine | Admitting: Family Medicine

## 2024-04-05 ENCOUNTER — Other Ambulatory Visit: Payer: Self-pay

## 2024-04-05 DIAGNOSIS — R338 Other retention of urine: Secondary | ICD-10-CM | POA: Insufficient documentation

## 2024-04-05 DIAGNOSIS — R39198 Other difficulties with micturition: Secondary | ICD-10-CM | POA: Insufficient documentation

## 2024-04-05 DIAGNOSIS — R300 Dysuria: Secondary | ICD-10-CM | POA: Insufficient documentation

## 2024-04-05 DIAGNOSIS — R3911 Hesitancy of micturition: Secondary | ICD-10-CM | POA: Diagnosis present

## 2024-04-05 DIAGNOSIS — N401 Enlarged prostate with lower urinary tract symptoms: Secondary | ICD-10-CM | POA: Diagnosis present

## 2024-04-05 HISTORY — DX: Syncope and collapse: R55

## 2024-04-05 LAB — POCT URINE DIPSTICK
Bilirubin, UA: NEGATIVE
Blood, UA: NEGATIVE
Glucose, UA: NEGATIVE mg/dL
Ketones, POC UA: NEGATIVE mg/dL
Leukocytes, UA: NEGATIVE
Nitrite, UA: NEGATIVE
POC PROTEIN,UA: NEGATIVE
Spec Grav, UA: 1.02 (ref 1.010–1.025)
Urobilinogen, UA: 0.2 U/dL
pH, UA: 7 (ref 5.0–8.0)

## 2024-04-05 MED ORDER — TAMSULOSIN HCL 0.4 MG PO CAPS
0.4000 mg | ORAL_CAPSULE | Freq: Every day | ORAL | 0 refills | Status: DC
  Filled 2024-04-05: qty 30, 30d supply, fill #0

## 2024-04-05 MED ORDER — DOXYCYCLINE HYCLATE 100 MG PO CAPS
100.0000 mg | ORAL_CAPSULE | Freq: Two times a day (BID) | ORAL | 0 refills | Status: AC
Start: 2024-04-05 — End: 2024-04-15
  Filled 2024-04-05: qty 20, 10d supply, fill #0

## 2024-04-05 NOTE — ED Provider Notes (Signed)
 [facility]    CSN: [id number] Arrival date & time: 04/05/24  1551      History   Chief Complaint Chief Complaint  Patient presents with   Urinary Problem    HPI Eric Keller is a 38 y.o. male.   HPI Here for urinary hesitancy and retention.  It has been bothering him for about 2 months and has gotten worse recently.  No dysuria currently.  About 2 months ago he had a brief episode where he felt feverish and had some dysuria.  He did not end up getting to see anyone at that point.  He states he has had a UTI with similar symptoms in the past.  No blood in the urine and no fever currently.  NKDA  A he does take gabapentin , lisinopril , metoprolol , and quetiapine .  No medications are new.  He does however take Benadryl for allergies. Past Medical History:  Diagnosis Date   Alcohol  withdrawal (HCC) 07/20/2019   Anxiety    Anxiety    Depression    H/O: substance abuse (HCC)    Hypertension 2000   Syncope and collapse 2020    Patient Active Problem List   Diagnosis Date Noted   GAD (generalized anxiety disorder) 06/11/2021   Insomnia 06/11/2021   MDD (major depressive disorder), recurrent episode, severe (HCC) 06/08/2021   Suicidal ideation 10/31/2020   Nausea and vomiting 10/31/2020   Alcohol  withdrawal (HCC) 10/30/2020   Major depressive disorder, recurrent episode, moderate (HCC) 08/08/2020   Polysubstance dependence (HCC) 10/03/2019   MDD (major depressive disorder), recurrent severe, without psychosis (HCC) 10/02/2019   Polysubstance dependence including opioid type drug with complication, continuous use (HCC) 07/22/2019   Benzodiazepine withdrawal without complication (HCC) 07/22/2019   Alcohol  dependence with uncomplicated withdrawal (HCC) 07/19/2019   LFT elevation    Essential hypertension, benign 05/11/2018   Flushing 05/11/2018   Nonintractable headache 05/11/2018   Erythrocytosis 05/11/2018   Anxiety 04/14/2011   Depression 04/14/2011     Past Surgical History:  Procedure Laterality Date   WISDOM TOOTH EXTRACTION         Home Medications    Prior to Admission medications   Medication Sig Start Date End Date Taking? Authorizing Provider  doxycycline  (VIBRAMYCIN ) 100 MG capsule Take 1 capsule (100 mg total) by mouth 2 (two) times daily for 10 days. 04/05/24 04/15/24 Yes Gorge Almanza, Sharlet POUR, MD  FLUoxetine  (PROZAC ) 40 MG capsule Take 1 capsule (40 mg total) by mouth daily. 10/19/23  Yes Tanda Bleacher, MD  gabapentin  (NEURONTIN ) 400 MG capsule Take 1 capsule (400 mg total) by mouth 3 (three) times daily. 03/22/24  Yes Tanda Bleacher, MD  lisinopril  (ZESTRIL ) 20 MG tablet Take 1 tablet (20 mg total) by mouth daily. 12/19/23  Yes Tanda Bleacher, MD  metoprolol  tartrate (LOPRESSOR ) 25 MG tablet Take 1 tablet (25 mg total) by mouth 2 (two) times daily. 07/19/23  Yes Tanda Bleacher, MD  QUEtiapine  (SEROQUEL ) 100 MG tablet Take 1 tablet (100 mg total) by mouth at bedtime. 01/18/24  Yes Tanda Bleacher, MD  tadalafil  (CIALIS ) 5 MG tablet Take 1 tablet (5 mg total) by mouth daily. 11/24/23  Yes Danton Jon HERO, PA-C  tamsulosin (FLOMAX) 0.4 MG CAPS capsule Take 1 capsule (0.4 mg total) by mouth daily. 04/05/24  Yes Vonna Sharlet POUR, MD  chlorhexidine  (PERIDEX ) 0.12 % solution Swish 1 capful for one minute and spit twice daily for 10 days. 11/08/23     hydrOXYzine  (ATARAX ) 25 MG tablet Take 1 tablet (25  mg total) by mouth 3 (three) times daily as needed for anxiety. Patient not taking: Reported on 10/19/2023 07/23/21   Tanda Bleacher, MD  Multiple Vitamin (MULTIVITAMIN WITH MINERALS) TABS tablet Take 1 tablet by mouth daily. Patient not taking: Reported on 10/19/2023 06/12/21   Hansel Comer RAMAN, MD  thiamine  100 MG tablet Take 1 tablet (100 mg total) by mouth daily. Patient not taking: Reported on 10/19/2023 06/12/21   Hansel Comer RAMAN, MD    Family History Family History  Problem Relation Age of Onset   Cancer Mother     Hypertension Paternal Grandfather    Heart disease Paternal Grandfather        MIs   Heart attack Paternal Grandfather    Heart disease Other    Stroke Other     Social History Social History   Tobacco Use   Smoking status: Never    Passive exposure: Past   Smokeless tobacco: Never  Vaping Use   Vaping status: Some Days   Substances: Nicotine , Flavoring  Substance Use Topics   Alcohol  use: Not Currently   Drug use: Not Currently    Types: Marijuana    Comment: Vicodin Abuse, BEnzos     Allergies   Patient has no known allergies.   Review of Systems Review of Systems   Physical Exam Triage Vital Signs ED Triage Vitals  Encounter Vitals Group     BP 04/05/24 1610 124/75     Girls Systolic BP Percentile --      Girls Diastolic BP Percentile --      Boys Systolic BP Percentile --      Boys Diastolic BP Percentile --      Pulse Rate 04/05/24 1610 63     Resp 04/05/24 1610 18     Temp 04/05/24 1610 98.3 F (36.8 C)     Temp Source 04/05/24 1610 Oral     SpO2 04/05/24 1610 97 %     Weight 04/05/24 1606 230 lb (104.3 kg)     Height 04/05/24 1606 6' 2 (1.88 m)     Head Circumference --      Peak Flow --      Pain Score 04/05/24 1556 0     Pain Loc --      Pain Education --      Exclude from Growth Chart --    No data found.  Updated Vital Signs BP 124/75 (BP Location: Left Arm)   Pulse 63   Temp 98.3 F (36.8 C) (Oral)   Resp 18   Ht 6' 2 (1.88 m)   Wt 104.3 kg   SpO2 97%   BMI 29.53 kg/m   Visual Acuity Right Eye Distance:   Left Eye Distance:   Bilateral Distance:    Right Eye Near:   Left Eye Near:    Bilateral Near:     Physical Exam Vitals reviewed.  Constitutional:      General: He is not in acute distress.    Appearance: He is not ill-appearing, toxic-appearing or diaphoretic.  HENT:     Mouth/Throat:     Mouth: Mucous membranes are moist.  Eyes:     Extraocular Movements: Extraocular movements intact.     Pupils: Pupils are  equal, round, and reactive to light.  Cardiovascular:     Rate and Rhythm: Normal rate and regular rhythm.     Heart sounds: No murmur heard. Pulmonary:     Effort: Pulmonary effort is normal.  Breath sounds: Normal breath sounds.  Abdominal:     Palpations: Abdomen is soft.     Tenderness: There is no abdominal tenderness.  Musculoskeletal:     Cervical back: Neck supple.  Lymphadenopathy:     Cervical: No cervical adenopathy.  Skin:    Coloration: Skin is not jaundiced or pale.  Neurological:     General: No focal deficit present.     Mental Status: He is alert and oriented to person, place, and time.  Psychiatric:        Behavior: Behavior normal.      UC Treatments / Results  Labs (all labs ordered are listed, but only abnormal results are displayed) Labs Reviewed  POCT URINE DIPSTICK - Abnormal; Notable for the following components:      Result Value   Clarity, UA cloudy (*)    All other components within normal limits  URINE CULTURE    EKG   Radiology No results found.  Procedures Procedures (including critical care time)  Medications Ordered in UC Medications - No data to display  Initial Impression / Assessment and Plan / UC Course  I have reviewed the triage vital signs and the nursing notes.  Pertinent labs & imaging results that were available during my care of the patient were reviewed by me and considered in my medical decision making (see chart for details).     Urinalysis is clear.  I discussed with him the think most likely this is BPH causing his symptoms.  Since he had an episode of dysuria and possible fever at the start of it, however I am going to treat for prostatitis.  Urine culture is sent.  Doxycycline  is sent for the possible prostatitis and tamsulosin is sent in for BPH.  (The quinolones interact with his Seroquel  to cause QT prolongation)   He has follow-up appointment with his primary care coming up at the end of this month.   He will discuss this problem with her. Final Clinical Impressions(s) / UC Diagnoses   Final diagnoses:  Difficulty urinating  Urinary hesitancy  Dysuria  Benign prostatic hyperplasia with urinary retention     Discharge Instructions      The urinalysis was clear; urine culture is still sent and staff will notify if it looks like you need a different antibiotic  Take doxycycline  100 mg --1 capsule 2 times daily for 10 days  Tamsulosin 0.4 mg--1 daily.  This is to help urinary flow      ED Prescriptions     Medication Sig Dispense Auth. Provider   tamsulosin (FLOMAX) 0.4 MG CAPS capsule Take 1 capsule (0.4 mg total) by mouth daily. 30 capsule Vonna Sharlet POUR, MD   doxycycline  (VIBRAMYCIN ) 100 MG capsule Take 1 capsule (100 mg total) by mouth 2 (two) times daily for 10 days. 20 capsule Arianni Gallego K, MD      PDMP not reviewed this encounter.   Vonna Sharlet POUR, MD 04/05/24 [phone]

## 2024-04-05 NOTE — ED Triage Notes (Signed)
 Patient reports difficulty fully urinating or emptying the bladder, with symptoms gradually worsening over time. When the urgency to urinate is strong, it becomes very difficult to do so. The patient wakes up several times at night needing to urinate; although able to go, it takes a while. The more urgent the need, the harder it is to urinate. There is no history of recent trauma or injury, no fever, nausea, or vomiting.  Approximately two months ago, the patient experienced symptoms suggestive of a urinary tract infection (UTI), including body aches, low-grade fever, and sweats, which have since resolved. The patient's primary care provider is Dr. Tanda at [facility]. A few days ago, the patient attempted to see their PCP, but due to a busy schedule and no available appointments, they were advised to come here.

## 2024-04-05 NOTE — Discharge Instructions (Addendum)
 The urinalysis was clear; urine culture is still sent and staff will notify if it looks like you need a different antibiotic  Take doxycycline  100 mg --1 capsule 2 times daily for 10 days  Tamsulosin 0.4 mg--1 daily.  This is to help urinary flow

## 2024-04-06 ENCOUNTER — Other Ambulatory Visit: Payer: Self-pay

## 2024-04-06 LAB — URINE CULTURE: Culture: NO GROWTH

## 2024-04-13 ENCOUNTER — Other Ambulatory Visit: Payer: Self-pay

## 2024-04-19 ENCOUNTER — Encounter: Payer: Self-pay | Admitting: Family Medicine

## 2024-04-19 ENCOUNTER — Ambulatory Visit (INDEPENDENT_AMBULATORY_CARE_PROVIDER_SITE_OTHER): Payer: MEDICAID | Admitting: Family Medicine

## 2024-04-19 VITALS — BP 128/77 | HR 57 | Ht 74.0 in | Wt 221.2 lb

## 2024-04-19 DIAGNOSIS — Z0000 Encounter for general adult medical examination without abnormal findings: Secondary | ICD-10-CM | POA: Diagnosis not present

## 2024-04-19 DIAGNOSIS — Z136 Encounter for screening for cardiovascular disorders: Secondary | ICD-10-CM | POA: Diagnosis not present

## 2024-04-19 DIAGNOSIS — Z130 Encounter for screening for diseases of the blood and blood-forming organs and certain disorders involving the immune mechanism: Secondary | ICD-10-CM | POA: Diagnosis not present

## 2024-04-19 DIAGNOSIS — Z1329 Encounter for screening for other suspected endocrine disorder: Secondary | ICD-10-CM

## 2024-04-19 DIAGNOSIS — Z13228 Encounter for screening for other metabolic disorders: Secondary | ICD-10-CM

## 2024-04-19 NOTE — Progress Notes (Signed)
 Established Patient Office Visit  Subjective    Patient ID: Eric Keller, male    DOB: Nov 19, 1985  Age: 38 y.o. MRN: [id number]  CC:  Chief Complaint  Patient presents with   Annual Exam    HPI Pal Shell presents for routine annual exam. Patient denies acute complaints or concerns.   Outpatient Encounter Medications as of 04/19/2024  Medication Sig   FLUoxetine  (PROZAC ) 40 MG capsule Take 1 capsule (40 mg total) by mouth daily.   gabapentin  (NEURONTIN ) 400 MG capsule Take 1 capsule (400 mg total) by mouth 3 (three) times daily.   lisinopril  (ZESTRIL ) 20 MG tablet Take 1 tablet (20 mg total) by mouth daily.   metoprolol  tartrate (LOPRESSOR ) 25 MG tablet Take 1 tablet (25 mg total) by mouth 2 (two) times daily.   QUEtiapine  (SEROQUEL ) 100 MG tablet Take 1 tablet (100 mg total) by mouth at bedtime.   tadalafil  (CIALIS ) 5 MG tablet Take 1 tablet (5 mg total) by mouth daily.   tamsulosin (FLOMAX) 0.4 MG CAPS capsule Take 1 capsule (0.4 mg total) by mouth daily.   chlorhexidine  (PERIDEX ) 0.12 % solution Swish 1 capful for one minute and spit twice daily for 10 days. (Patient not taking: Reported on 04/19/2024)   hydrOXYzine  (ATARAX ) 25 MG tablet Take 1 tablet (25 mg total) by mouth 3 (three) times daily as needed for anxiety. (Patient not taking: Reported on 10/19/2023)   Multiple Vitamin (MULTIVITAMIN WITH MINERALS) TABS tablet Take 1 tablet by mouth daily. (Patient not taking: Reported on 10/19/2023)   thiamine  100 MG tablet Take 1 tablet (100 mg total) by mouth daily. (Patient not taking: Reported on 04/19/2024)   No facility-administered encounter medications on file as of 04/19/2024.    Past Medical History:  Diagnosis Date   Alcohol  withdrawal (HCC) 07/20/2019   Anxiety    Anxiety    Depression    H/O: substance abuse (HCC)    Hypertension 2000   Syncope and collapse 2020    Past Surgical History:  Procedure Laterality Date   WISDOM TOOTH EXTRACTION      Family  History  Problem Relation Age of Onset   Cancer Mother    Hypertension Paternal Grandfather    Heart disease Paternal Grandfather        MIs   Heart attack Paternal Grandfather    Heart disease Other    Stroke Other     Social History   Socioeconomic History   Marital status: Single    Spouse name: Not on file   Number of children: 0   Years of education: Not on file   Highest education level: Associate degree: occupational, scientist, product/process development, or vocational program  Occupational History   Occupation: Journalist, newspaper  Tobacco Use   Smoking status: Never    Passive exposure: Past   Smokeless tobacco: Never  Vaping Use   Vaping status: Some Days   Substances: Nicotine , Flavoring  Substance and Sexual Activity   Alcohol  use: Not Currently   Drug use: Not Currently    Types: Marijuana    Comment: Vicodin Abuse, BEnzos   Sexual activity: Not Currently    Birth control/protection: None  Other Topics Concern   Not on file  Social History Narrative   Not on file   Social Drivers of Health   Financial Resource Strain: Low Risk  (04/19/2024)   Overall Financial Resource Strain (CARDIA)    Difficulty of Paying Living Expenses: Not very hard  Food Insecurity: Food Insecurity Present (04/19/2024)  Hunger Vital Sign    Worried About Running Out of Food in the Last Year: Sometimes true    Ran Out of Food in the Last Year: Sometimes true  Transportation Needs: Unmet Transportation Needs (04/19/2024)   PRAPARE - Administrator, Civil Service (Medical): Yes    Lack of Transportation (Non-Medical): No  Physical Activity: Sufficiently Active (04/19/2024)   Exercise Vital Sign    Days of Exercise per Week: 5 days    Minutes of Exercise per Session: 60 min  Stress: Stress Concern Present (07/18/2023)   [employer] of Occupational Health - Occupational Stress Questionnaire    Feeling of Stress : To some extent  Social Connections: Moderately Isolated (04/19/2024)   Social  Connection and Isolation Panel    Frequency of Communication with Friends and Family: More than three times a week    Frequency of Social Gatherings with Friends and Family: More than three times a week    Attends Religious Services: More than 4 times per year    Active Member of [employer] or Organizations: No    Attends Banker Meetings: Never    Marital Status: Never married  Intimate Partner Violence: Not At Risk (04/19/2024)   Humiliation, Afraid, Rape, and Kick questionnaire    Fear of Current or Ex-Partner: No    Emotionally Abused: No    Physically Abused: No    Sexually Abused: No    Review of Systems  All other systems reviewed and are negative.       Objective    BP 128/77   Pulse (!) 57   Ht 6' 2 (1.88 m)   Wt 221 lb 3.2 oz (100.3 kg)   SpO2 98%   BMI 28.40 kg/m   Physical Exam Vitals and nursing note reviewed.  Constitutional:      General: He is not in acute distress. HENT:     Head: Normocephalic and atraumatic.     Right Ear: Tympanic membrane, ear canal and external ear normal.     Left Ear: Tympanic membrane, ear canal and external ear normal.     Nose: Nose normal.     Mouth/Throat:     Mouth: Mucous membranes are moist.     Pharynx: Oropharynx is clear.  Eyes:     Conjunctiva/sclera: Conjunctivae normal.     Pupils: Pupils are equal, round, and reactive to light.  Neck:     Thyroid : No thyromegaly.  Cardiovascular:     Rate and Rhythm: Normal rate and regular rhythm.     Heart sounds: Normal heart sounds. No murmur heard. Pulmonary:     Effort: Pulmonary effort is normal.     Breath sounds: Normal breath sounds.  Abdominal:     General: There is no distension.     Palpations: Abdomen is soft. There is no mass.     Tenderness: There is no abdominal tenderness.     Hernia: There is no hernia in the left inguinal area or right inguinal area.  Musculoskeletal:        General: Normal range of motion.     Cervical back: Normal range  of motion and neck supple.     Right lower leg: No edema.     Left lower leg: No edema.  Skin:    General: Skin is warm and dry.  Neurological:     General: No focal deficit present.     Mental Status: He is alert and oriented to person, place, and  time. Mental status is at baseline.  Psychiatric:        Mood and Affect: Mood normal.        Behavior: Behavior normal.         Assessment & Plan:   Annual physical exam -     Comprehensive metabolic panel with GFR  Screening for deficiency anemia -     CBC with Differential/Platelet  Encounter for screening for cardiovascular disorders -     Lipid panel  Screening for endocrine/metabolic/immunity disorders -     Hemoglobin A1c     No follow-ups on file.   Tanda Raguel SQUIBB, MD

## 2024-04-20 LAB — LIPID PANEL
Chol/HDL Ratio: 5.4 ratio — ABNORMAL HIGH (ref 0.0–5.0)
Cholesterol, Total: 163 mg/dL (ref 100–199)
HDL: 30 mg/dL — ABNORMAL LOW (ref >39)
LDL Chol Calc (NIH): 108 mg/dL — ABNORMAL HIGH (ref 0–99)
Triglycerides: 139 mg/dL (ref 0–149)
VLDL Cholesterol Cal: 25 mg/dL (ref 5–40)

## 2024-04-20 LAB — COMPREHENSIVE METABOLIC PANEL WITH GFR
ALT: 38 IU/L (ref 0–44)
AST: 35 IU/L (ref 0–40)
Albumin: 3.4 g/dL — ABNORMAL LOW (ref 4.1–5.1)
Alkaline Phosphatase: 58 IU/L (ref 47–123)
BUN/Creatinine Ratio: 7 — ABNORMAL LOW (ref 9–20)
BUN: 9 mg/dL (ref 6–20)
Bilirubin Total: 0.3 mg/dL (ref 0.0–1.2)
CO2: 29 mmol/L (ref 20–29)
Calcium: 9 mg/dL (ref 8.7–10.2)
Chloride: 100 mmol/L (ref 96–106)
Creatinine, Ser: 1.32 mg/dL — ABNORMAL HIGH (ref 0.76–1.27)
Globulin, Total: 1.6 g/dL (ref 1.5–4.5)
Glucose: 103 mg/dL — ABNORMAL HIGH (ref 70–99)
Potassium: 4.8 mmol/L (ref 3.5–5.2)
Sodium: 140 mmol/L (ref 134–144)
Total Protein: 5 g/dL — ABNORMAL LOW (ref 6.0–8.5)
eGFR: 70 mL/min/1.73 (ref >59)

## 2024-04-20 LAB — CBC WITH DIFFERENTIAL/PLATELET
Basophils Absolute: 0.1 x10E3/uL (ref 0.0–0.2)
Basos: 1 %
EOS (ABSOLUTE): 1.1 x10E3/uL — ABNORMAL HIGH (ref 0.0–0.4)
Eos: 13 %
Hematocrit: 52.9 % — ABNORMAL HIGH (ref 37.5–51.0)
Hemoglobin: 16.9 g/dL (ref 13.0–17.7)
Immature Grans (Abs): 0 x10E3/uL (ref 0.0–0.1)
Immature Granulocytes: 0 %
Lymphocytes Absolute: 2.3 x10E3/uL (ref 0.7–3.1)
Lymphs: 27 %
MCH: 29.6 pg (ref 26.6–33.0)
MCHC: 31.9 g/dL (ref 31.5–35.7)
MCV: 93 fL (ref 79–97)
Monocytes Absolute: 0.7 x10E3/uL (ref 0.1–0.9)
Monocytes: 8 %
Neutrophils Absolute: 4.3 x10E3/uL (ref 1.4–7.0)
Neutrophils: 51 %
Platelets: 297 x10E3/uL (ref 150–450)
RBC: 5.7 x10E6/uL (ref 4.14–5.80)
RDW: 13.2 % (ref 11.6–15.4)
WBC: 8.4 x10E3/uL (ref 3.4–10.8)

## 2024-04-20 LAB — HEMOGLOBIN A1C
Est. average glucose Bld gHb Est-mCnc: 123 mg/dL
Hgb A1c MFr Bld: 5.9 % — ABNORMAL HIGH (ref 4.8–5.6)

## 2024-04-26 ENCOUNTER — Other Ambulatory Visit: Payer: Self-pay

## 2024-05-02 ENCOUNTER — Other Ambulatory Visit: Payer: Self-pay | Admitting: Family Medicine

## 2024-05-02 ENCOUNTER — Ambulatory Visit: Payer: Self-pay | Admitting: Family Medicine

## 2024-05-07 ENCOUNTER — Other Ambulatory Visit: Payer: Self-pay

## 2024-05-07 ENCOUNTER — Ambulatory Visit: Payer: Self-pay

## 2024-05-07 DIAGNOSIS — I10 Essential (primary) hypertension: Secondary | ICD-10-CM

## 2024-05-07 MED ORDER — METOPROLOL TARTRATE 25 MG PO TABS
25.0000 mg | ORAL_TABLET | Freq: Two times a day (BID) | ORAL | 1 refills | Status: AC
  Filled 2024-05-07: qty 180, 90d supply, fill #0

## 2024-05-07 NOTE — Telephone Encounter (Signed)
 FYI Only or Action Required?: Action required by provider: medication refill request.  Patient was last seen in primary care on 04/19/2024 by Tanda Bleacher, MD.  Called Nurse Triage reporting Medication Refill.  Triage Disposition: Call PCP Now  Patient/caregiver understands and will follow disposition?: Yes     Copied from CRM #[id number]. Topic: Clinical - Red Word Triage >> May 07, 2024 12:30 PM Alexandria E wrote: [facility] that prompted transfer to Nurse Triage: Patient has been without his metoprolol  tartrate (LOPRESSOR ) 25 MG tablet for 4 days, noticed increase in heart rate, 100bmp resting. Patient's face is flushed and he is getting shaky.      Reason for Disposition  [1] Prescription refill request for ESSENTIAL medicine (i.e., likelihood of harm to patient if not taken) AND [2] triager unable to refill per department policy  Answer Assessment - Initial Assessment Questions Refill request submitted on 11/12. Patient states he has been out of the medication for 4 days.     1. DRUG NAME: What medicine do you need to have refilled?     metoprolol  tartrate (LOPRESSOR ) 25 MG tablet  2. REFILLS REMAINING: How many refills are remaining? Notes: The label on the medicine or pill bottle will show how many refills are remaining. If there are no refills remaining, then a renewal may be needed.     None 3. EXPIRATION DATE: What is the expiration date? Note: The label states when the prescription will expire, and thus can no longer be refilled.)     N/A 4. PRESCRIBER: Who prescribed it? Note: The prescribing doctor or group is responsible for refill approvals..     Dr. Tanda  5. PHARMACY: Have you contacted your pharmacy (drugstore)? Note: Some pharmacies will contact the doctor (or NP/PA).      Yes 6. SYMPTOMS: Do you have any symptoms?     Blood pressure yesterday 165/85, pulse yesterday was 100 bpm, has not checked today, face is flushed  Protocols used: Medication  Refill and Renewal Call-A-AH

## 2024-05-18 ENCOUNTER — Other Ambulatory Visit: Payer: Self-pay

## 2024-05-18 ENCOUNTER — Other Ambulatory Visit: Payer: Self-pay | Admitting: Family Medicine

## 2024-05-20 ENCOUNTER — Other Ambulatory Visit: Payer: Self-pay

## 2024-05-20 ENCOUNTER — Other Ambulatory Visit: Payer: Self-pay | Admitting: Family Medicine

## 2024-05-20 DIAGNOSIS — F331 Major depressive disorder, recurrent, moderate: Secondary | ICD-10-CM

## 2024-05-23 ENCOUNTER — Telehealth: Payer: Self-pay

## 2024-05-23 NOTE — Telephone Encounter (Signed)
 Copied from CRM [id number]. Topic: Clinical - Medication Question >> May 23, 2024 12:36 PM Eric Keller wrote: Reason for CRM: Pt called to follow up refill request for QUEtiapine  (SEROQUEL ) 100 MG tablet and tamsulosin  (FLOMAX ) 0.4 MG CAPS capsule. Pt said tamsulosin  was prescribed at the urgent care next door and the provider was supposed to tell Dr. Tanda that she had prescribed it.

## 2024-05-24 ENCOUNTER — Other Ambulatory Visit: Payer: Self-pay

## 2024-05-24 MED ORDER — QUETIAPINE FUMARATE 100 MG PO TABS
100.0000 mg | ORAL_TABLET | Freq: Every day | ORAL | 3 refills | Status: AC
  Filled 2024-05-24: qty 30, 30d supply, fill #0
  Filled 2024-06-22: qty 30, 30d supply, fill #1
  Filled 2024-07-17: qty 30, 30d supply, fill #2

## 2024-05-24 MED ORDER — TAMSULOSIN HCL 0.4 MG PO CAPS
0.4000 mg | ORAL_CAPSULE | Freq: Every day | ORAL | 0 refills | Status: DC
  Filled 2024-05-24: qty 30, 30d supply, fill #0

## 2024-05-25 ENCOUNTER — Other Ambulatory Visit: Payer: Self-pay

## 2024-06-17 ENCOUNTER — Other Ambulatory Visit: Payer: Self-pay | Admitting: Family Medicine

## 2024-06-18 ENCOUNTER — Other Ambulatory Visit: Payer: Self-pay

## 2024-06-18 MED ORDER — LISINOPRIL 20 MG PO TABS
20.0000 mg | ORAL_TABLET | Freq: Every day | ORAL | 0 refills | Status: AC
  Filled 2024-06-18 – 2024-06-22 (×2): qty 90, 90d supply, fill #0

## 2024-06-18 MED ORDER — TAMSULOSIN HCL 0.4 MG PO CAPS
0.4000 mg | ORAL_CAPSULE | Freq: Every day | ORAL | 0 refills | Status: DC
  Filled 2024-06-18: qty 30, 30d supply, fill #0

## 2024-06-18 MED ORDER — GABAPENTIN 400 MG PO CAPS
400.0000 mg | ORAL_CAPSULE | Freq: Three times a day (TID) | ORAL | 0 refills | Status: AC
  Filled 2024-06-18 – 2024-07-09 (×2): qty 270, 90d supply, fill #0

## 2024-06-18 MED ORDER — FLUOXETINE HCL 40 MG PO CAPS
40.0000 mg | ORAL_CAPSULE | Freq: Every day | ORAL | 0 refills | Status: AC
  Filled 2024-06-18 – 2024-07-24 (×2): qty 90, 90d supply, fill #0

## 2024-06-18 NOTE — Telephone Encounter (Signed)
 Complete

## 2024-06-22 ENCOUNTER — Other Ambulatory Visit: Payer: Self-pay

## 2024-07-09 ENCOUNTER — Other Ambulatory Visit: Payer: Self-pay

## 2024-07-19 ENCOUNTER — Other Ambulatory Visit: Payer: Self-pay | Admitting: Family

## 2024-07-19 ENCOUNTER — Other Ambulatory Visit: Payer: Self-pay

## 2024-07-19 MED ORDER — TAMSULOSIN HCL 0.4 MG PO CAPS
0.4000 mg | ORAL_CAPSULE | Freq: Every day | ORAL | 0 refills | Status: AC
  Filled 2024-07-19: qty 30, 30d supply, fill #0

## 2024-07-24 ENCOUNTER — Other Ambulatory Visit: Payer: Self-pay

## 2024-07-24 ENCOUNTER — Other Ambulatory Visit: Payer: Self-pay | Admitting: Family Medicine

## 2024-07-24 NOTE — Telephone Encounter (Signed)
 Copied from CRM #[id number]. Topic: Clinical - Medication Refill >> Jul 24, 2024  3:56 PM Delon T wrote: Medication: tadalafil  (CIALIS ) 5 MG tablet  Has the patient contacted their pharmacy? Yes (Agent: If no, request that the patient contact the pharmacy for the refill. If patient does not wish to contact the pharmacy document the reason why and proceed with request.) (Agent: If yes, when and what did the pharmacy advise?)  This is the patient's preferred pharmacy:  [facility] [address], [address] [place] [address] Phone: [phone] Fax: [phone]  Is this the correct pharmacy for this prescription? Yes If no, delete pharmacy and type the correct one.   Has the prescription been filled recently? Yes  Is the patient out of the medication? Yes  Has the patient been seen for an appointment in the last year OR does the patient have an upcoming appointment? Yes  Can we respond through MyChart? Yes  Agent: Please be advised that Rx refills may take up to 3 business days. We ask that you follow-up with your pharmacy.

## 2024-07-26 ENCOUNTER — Other Ambulatory Visit: Payer: Self-pay

## 2024-07-26 NOTE — Telephone Encounter (Signed)
 Requested medication (s) are due for refill today: yes  Requested medication (s) are on the active medication list: yes   Last refill:  11/24/23 #30 1 refills  Future visit scheduled: no   Notes to clinic:  last ordered by Jon Moores, PA. Do you want to refill Rx?     Requested Prescriptions  Pending Prescriptions Disp Refills   tadalafil  (CIALIS ) 5 MG tablet 30 tablet 1    Sig: Take 1 tablet (5 mg total) by mouth daily.     Urology: Erectile Dysfunction Agents Passed - 07/26/2024 12:56 PM      Passed - AST in normal range and within 360 days    AST  Date Value Ref Range Status  04/19/2024 35 0 - 40 IU/L Final   SGOT(AST)  Date Value Ref Range Status  03/01/2012 17 15 - 37 Unit/L Final         Passed - ALT in normal range and within 360 days    ALT  Date Value Ref Range Status  04/19/2024 38 0 - 44 IU/L Final   SGPT (ALT)  Date Value Ref Range Status  03/01/2012 28 12 - 78 U/L Final         Passed - Last BP in normal range    BP Readings from Last 1 Encounters:  04/19/24 128/77         Passed - Valid encounter within last 12 months    Recent Outpatient Visits           3 months ago Annual physical exam   [facility], MD   9 months ago Scalp lesion   [facility] [facility], MD   1 year ago Anxiety and depression   [place] [facility], MD   1 year ago Essential hypertension, benign   [facility], MD   2 years ago Essential hypertension, benign   [place] [facility], MD

## 2024-07-26 NOTE — Telephone Encounter (Unsigned)
 Copied from CRM #[id number]. Topic: Clinical - Medication Refill >> Jul 26, 2024  2:52 PM Wess RAMAN wrote: Patient now has an upcoming appt. Can  tadalafil  (CIALIS ) 5 MG tablet be refilled? Patient is out of medication

## 2024-07-27 ENCOUNTER — Other Ambulatory Visit: Payer: Self-pay

## 2024-07-27 MED ORDER — TADALAFIL 5 MG PO TABS
5.0000 mg | ORAL_TABLET | Freq: Every day | ORAL | 1 refills | Status: AC
  Filled 2024-07-27: qty 30, 30d supply, fill #0

## 2024-09-10 ENCOUNTER — Ambulatory Visit: Payer: Self-pay | Admitting: Family Medicine
